# Patient Record
Sex: Female | Born: 1985 | Race: Black or African American | Hispanic: No | Marital: Married | Smoking: Current some day smoker
Health system: Southern US, Community
[De-identification: ages and names within clinical notes are randomized; demographics above are authoritative.]

## PROBLEM LIST (undated history)

## (undated) ENCOUNTER — Inpatient Hospital Stay (HOSPITAL_COMMUNITY): Payer: Self-pay

## (undated) DIAGNOSIS — D649 Anemia, unspecified: Secondary | ICD-10-CM

## (undated) DIAGNOSIS — B009 Herpesviral infection, unspecified: Secondary | ICD-10-CM

## (undated) DIAGNOSIS — B999 Unspecified infectious disease: Secondary | ICD-10-CM

## (undated) DIAGNOSIS — F32A Depression, unspecified: Secondary | ICD-10-CM

## (undated) DIAGNOSIS — N390 Urinary tract infection, site not specified: Secondary | ICD-10-CM

## (undated) DIAGNOSIS — M1990 Unspecified osteoarthritis, unspecified site: Secondary | ICD-10-CM

## (undated) DIAGNOSIS — I10 Essential (primary) hypertension: Secondary | ICD-10-CM

## (undated) DIAGNOSIS — F419 Anxiety disorder, unspecified: Secondary | ICD-10-CM

## (undated) HISTORY — DX: Anemia, unspecified: D64.9

## (undated) HISTORY — DX: Depression, unspecified: F32.A

## (undated) HISTORY — PX: HERNIA REPAIR: SHX51

## (undated) HISTORY — DX: Anxiety disorder, unspecified: F41.9

---

## 2004-07-18 ENCOUNTER — Emergency Department (HOSPITAL_COMMUNITY): Admission: EM | Admit: 2004-07-18 | Discharge: 2004-07-18 | Payer: Self-pay | Admitting: Emergency Medicine

## 2004-11-28 ENCOUNTER — Inpatient Hospital Stay (HOSPITAL_COMMUNITY): Admission: AD | Admit: 2004-11-28 | Discharge: 2004-11-28 | Payer: Self-pay | Admitting: Obstetrics & Gynecology

## 2005-01-04 ENCOUNTER — Emergency Department (HOSPITAL_COMMUNITY): Admission: EM | Admit: 2005-01-04 | Discharge: 2005-01-04 | Payer: Self-pay | Admitting: Emergency Medicine

## 2005-02-25 ENCOUNTER — Ambulatory Visit (HOSPITAL_COMMUNITY): Admission: RE | Admit: 2005-02-25 | Discharge: 2005-02-25 | Payer: Self-pay | Admitting: Obstetrics & Gynecology

## 2005-03-06 ENCOUNTER — Inpatient Hospital Stay (HOSPITAL_COMMUNITY): Admission: AD | Admit: 2005-03-06 | Discharge: 2005-03-06 | Payer: Self-pay | Admitting: Obstetrics

## 2005-07-26 ENCOUNTER — Inpatient Hospital Stay (HOSPITAL_COMMUNITY): Admission: AD | Admit: 2005-07-26 | Discharge: 2005-07-28 | Payer: Self-pay | Admitting: Obstetrics & Gynecology

## 2005-09-17 ENCOUNTER — Ambulatory Visit (HOSPITAL_COMMUNITY): Admission: RE | Admit: 2005-09-17 | Discharge: 2005-09-17 | Payer: Self-pay | Admitting: Obstetrics & Gynecology

## 2007-09-23 LAB — CYTOLOGY - PAP: Pap Smear: NEGATIVE

## 2008-04-05 ENCOUNTER — Inpatient Hospital Stay (HOSPITAL_COMMUNITY): Admission: AD | Admit: 2008-04-05 | Discharge: 2008-04-05 | Payer: Self-pay | Admitting: Obstetrics

## 2008-05-24 ENCOUNTER — Inpatient Hospital Stay (HOSPITAL_COMMUNITY): Admission: AD | Admit: 2008-05-24 | Discharge: 2008-05-24 | Payer: Self-pay | Admitting: Obstetrics

## 2008-06-12 ENCOUNTER — Inpatient Hospital Stay (HOSPITAL_COMMUNITY): Admission: RE | Admit: 2008-06-12 | Discharge: 2008-06-14 | Payer: Self-pay | Admitting: Obstetrics

## 2008-12-29 ENCOUNTER — Emergency Department (HOSPITAL_COMMUNITY): Admission: EM | Admit: 2008-12-29 | Discharge: 2008-12-29 | Payer: Self-pay | Admitting: Emergency Medicine

## 2009-01-28 ENCOUNTER — Emergency Department (HOSPITAL_COMMUNITY): Admission: EM | Admit: 2009-01-28 | Discharge: 2009-01-28 | Payer: Self-pay | Admitting: Emergency Medicine

## 2009-02-11 ENCOUNTER — Emergency Department (HOSPITAL_COMMUNITY): Admission: EM | Admit: 2009-02-11 | Discharge: 2009-02-12 | Payer: Self-pay | Admitting: Emergency Medicine

## 2009-09-01 ENCOUNTER — Inpatient Hospital Stay (HOSPITAL_COMMUNITY): Admission: AD | Admit: 2009-09-01 | Discharge: 2009-09-01 | Payer: Self-pay | Admitting: Obstetrics and Gynecology

## 2009-09-01 ENCOUNTER — Other Ambulatory Visit: Payer: Self-pay | Admitting: Emergency Medicine

## 2009-09-03 ENCOUNTER — Inpatient Hospital Stay (HOSPITAL_COMMUNITY): Admission: AD | Admit: 2009-09-03 | Discharge: 2009-09-03 | Payer: Self-pay | Admitting: Obstetrics & Gynecology

## 2010-12-20 ENCOUNTER — Emergency Department: Payer: Self-pay | Admitting: Emergency Medicine

## 2011-01-22 LAB — CBC
HCT: 30.9 % — ABNORMAL LOW (ref 36.0–46.0)
HCT: 31 % — ABNORMAL LOW (ref 36.0–46.0)
Hemoglobin: 10.2 g/dL — ABNORMAL LOW (ref 12.0–15.0)
Hemoglobin: 10.6 g/dL — ABNORMAL LOW (ref 12.0–15.0)
MCHC: 33.2 g/dL (ref 30.0–36.0)
MCHC: 34.2 g/dL (ref 30.0–36.0)
MCV: 89.4 fL (ref 78.0–100.0)
MCV: 90.4 fL (ref 78.0–100.0)
Platelets: 361 10*3/uL (ref 150–400)
Platelets: 403 10*3/uL — ABNORMAL HIGH (ref 150–400)
RBC: 3.42 MIL/uL — ABNORMAL LOW (ref 3.87–5.11)
RBC: 3.47 MIL/uL — ABNORMAL LOW (ref 3.87–5.11)
RDW: 15.2 % (ref 11.5–15.5)
RDW: 15.4 % (ref 11.5–15.5)
WBC: 7.3 10*3/uL (ref 4.0–10.5)
WBC: 8.6 10*3/uL (ref 4.0–10.5)

## 2011-01-22 LAB — HCG, QUANTITATIVE, PREGNANCY
hCG, Beta Chain, Quant, S: 4181 m[IU]/mL — ABNORMAL HIGH (ref <5)
hCG, Beta Chain, Quant, S: 811 m[IU]/mL — ABNORMAL HIGH (ref <5)

## 2011-01-22 LAB — PREGNANCY, URINE: Preg Test, Ur: POSITIVE

## 2011-01-22 LAB — ABO/RH: ABO/RH(D): A POS

## 2011-01-30 LAB — POCT PREGNANCY, URINE: Preg Test, Ur: POSITIVE

## 2011-03-07 NOTE — H&P (Signed)
NAME:  Long, Kirsten              ACCOUNT NO.:  [id number]   MEDICAL RECORD NO.:  [id number]          PATIENT TYPE:  INP   LOCATION:  9169                          FACILITY:  [facility]   PHYSICIAN:  Roseanna Rainbow, M.D.DATE OF BIRTH:  December 15, 1985   DATE OF ADMISSION:  07/26/2005  DATE OF DISCHARGE:                                HISTORY & PHYSICAL   CHIEF COMPLAINT:  The patient is a 25 year old, gravida 1, para 0, with an  estimated date of confinement of July 26, 2005, with an intrauterine  pregnancy at 40+ weeks, complaining of uterine contractions.   HISTORY OF PRESENT ILLNESS:  Please see the above.   ANTEPARTUM COURSE/PROBLEMS/AND RISKS:  She is rubella equivocal.   PRENATAL SCREEN:  Antibody screen negative.  Blood type A positive.  Chlamydia negative.  GC negative.  GBS negative on June 28, 2005.  Hepatitis B surface antigen negative.  HIV negative.  Pap smear within  normal limits.  Quad screen within normal limits.  Rubella equivocal.  Sickle cell negative.  RPR nonreactive.   PAST MEDICAL HISTORY:  No significant history of medical diseases.   PAST SURGICAL HISTORY:  No previous surgery.   SOCIAL HISTORY:  She is employed at the [employer].  She is single.  Does not give any significant history of alcohol usage, has no significant  smoking history.  She denies elicit drug use.   FAMILY HISTORY:  No major illnesses known.   ALLERGIES:  No known drug allergies.   MEDICATIONS:  Prenatal vitamins.   PHYSICAL EXAMINATION:  VITAL SIGNS:  Stable, afebrile.  Fetal heart tracing  reassuring.  Tocodynamometer with uterine contractions every two minutes.  GENERAL:  Uncomfortable.  ABDOMEN:  Gravid.  PELVIC:  Sterile vaginal exam, 2-to-3-cm dilated, 90% effaced, bulging bag.  Artifical rupture of membranes with clear fluid.   ASSESSMENT:  1.  Primigravida with an intrauterine pregnancy at term.  2.  Latent labor.  3.  Fetal heart tracing consistent with  fetal well being.   PLAN:  1.  Admission.  2.  Expectant management.  3.  Anticipate a spontaneous vaginal delivery.      Roseanna Rainbow, M.D.  Electronically Signed     LAJ/MEDQ  D:  07/26/2005  T:  07/26/2005  Job:  [id number]

## 2011-05-05 ENCOUNTER — Emergency Department: Payer: Self-pay | Admitting: Emergency Medicine

## 2011-05-07 ENCOUNTER — Ambulatory Visit: Payer: Self-pay | Admitting: Family Medicine

## 2011-06-20 ENCOUNTER — Encounter: Payer: Self-pay | Admitting: Internal Medicine

## 2011-08-18 ENCOUNTER — Emergency Department (HOSPITAL_BASED_OUTPATIENT_CLINIC_OR_DEPARTMENT_OTHER)
Admission: EM | Admit: 2011-08-18 | Discharge: 2011-08-18 | Disposition: A | Discharge status: Home / Self Care | Payer: Managed Care, Other (non HMO) | Attending: Emergency Medicine | Admitting: Emergency Medicine

## 2011-08-18 ENCOUNTER — Encounter: Payer: Self-pay | Admitting: *Deleted

## 2011-08-18 ENCOUNTER — Emergency Department (INDEPENDENT_AMBULATORY_CARE_PROVIDER_SITE_OTHER): Payer: Managed Care, Other (non HMO)

## 2011-08-18 DIAGNOSIS — M549 Dorsalgia, unspecified: Secondary | ICD-10-CM | POA: Insufficient documentation

## 2011-08-18 DIAGNOSIS — R079 Chest pain, unspecified: Secondary | ICD-10-CM

## 2011-08-18 DIAGNOSIS — R0789 Other chest pain: Secondary | ICD-10-CM

## 2011-08-18 DIAGNOSIS — R071 Chest pain on breathing: Secondary | ICD-10-CM | POA: Insufficient documentation

## 2011-08-18 LAB — BASIC METABOLIC PANEL
BUN: 13 mg/dL (ref 6–23)
CO2: 29 mEq/L (ref 19–32)
Calcium: 9.7 mg/dL (ref 8.4–10.5)
Chloride: 104 mEq/L (ref 96–112)
Creatinine, Ser: 0.6 mg/dL (ref 0.50–1.10)
GFR calc Af Amer: >90 mL/min (ref >90)
GFR calc non Af Amer: >90 mL/min (ref >90)
Glucose, Bld: 93 mg/dL (ref 70–99)
Potassium: 3.7 mEq/L (ref 3.5–5.1)
Sodium: 139 mEq/L (ref 135–145)

## 2011-08-18 LAB — D-DIMER, QUANTITATIVE: D-Dimer, Quant: <0.22 ug/mL-FEU (ref 0.00–0.48)

## 2011-08-18 LAB — CBC
HCT: 35.8 % — ABNORMAL LOW (ref 36.0–46.0)
Hemoglobin: 12.3 g/dL (ref 12.0–15.0)
MCH: 29.8 pg (ref 26.0–34.0)
MCV: 86.7 fL (ref 78.0–100.0)
Platelets: 417 10*3/uL — ABNORMAL HIGH (ref 150–400)
RBC: 4.13 MIL/uL (ref 3.87–5.11)
RDW: 12.8 % (ref 11.5–15.5)
WBC: 7.4 10*3/uL (ref 4.0–10.5)

## 2011-08-18 MED ORDER — CYCLOBENZAPRINE HCL 10 MG PO TABS: 10.0000 mg | ORAL_TABLET | Freq: Two times a day (BID) | ORAL | Status: AC | PRN

## 2011-08-18 MED ORDER — HYDROCODONE-ACETAMINOPHEN 5-325 MG PO TABS: 1.0000 | ORAL_TABLET | Freq: Four times a day (QID) | ORAL | Status: AC | PRN

## 2011-08-18 MED ORDER — NAPROXEN 500 MG PO TABS: 500.0000 mg | ORAL_TABLET | Freq: Two times a day (BID) | ORAL | Status: DC

## 2011-08-18 NOTE — ED Notes (Signed)
Patient states she has had bilateral lower back pain for over one month.  Yesterday the pain worsened and is radiating up the right side of her body and across her anterior chest.

## 2011-08-18 NOTE — ED Provider Notes (Signed)
History    Scribed for Kirsten Jakes, MD, the patient was seen in room MH02/MH02. This chart was scribed by Katha Cabal.   CSN: [id number] Arrival date & time: 08/18/2011  3:51 PM   First MD Initiated Contact with Patient 08/18/11 1557      Chief Complaint  Patient presents with  . Back Pain    right side back pain radiating in to right chest and across the anterior chest    (Consider location/radiation/quality/duration/timing/severity/associated sxs/prior treatment) HPI Kirsten Long is a 25 y.o. female who presents to the Emergency Department complaining of sudden onset of ongoing moderate lower back pain.  Patient reports intermittent back pain for a couple years.  Persistent lower back pain that is greater in right than left back.  Current episode began a couple days ago.  Pain is described as throbbing.  Rated 9/10.  There is no radiation to LE and no numbness in feet.  Patient reports new onset of persistent right sided chest pain yesterday that radiates to right side.  Chest pain worsens with coughing and deep breathing.  Pain does not worsen with movement of right arm.  Denies abdominal pain, nausea, vomiting, swelling in legs, rash, fever and headache. There is no history of serious medical problems.    History reviewed. No pertinent past medical history.  History reviewed. No pertinent past surgical history.  No family history on file.  History  Substance Use Topics  . Smoking status: Passive Smoker  . Smokeless tobacco: Not on file  . Alcohol Use: Yes     occassionally    OB History    Grav Para Term Preterm Abortions TAB SAB Ect Mult Living                  Review of Systems 10 Systems reviewed and are negative for acute change except as noted in the HPI.  Allergies  Review of patient's allergies indicates no known allergies.  Home Medications   Current Outpatient Rx  Name Route Sig Dispense Refill  . IBUPROFEN 200 MG PO TABS Oral Take 400 mg  by mouth every 6 (six) hours as needed. For pain       BP 128/95  Pulse 87  Temp(Src) 98.9 F (37.2 C) (Oral)  Resp 20  Ht 5\' 3"  (1.6 m)  Wt 140 lb (63.504 kg)  BMI 24.80 kg/m2  SpO2 100%  LMP 08/09/2011  Physical Exam  Nursing note and vitals reviewed. Constitutional: She is oriented to person, place, and time. She appears well-developed and well-nourished. No distress.  HENT:  Head: Normocephalic and atraumatic.  Eyes: EOM are normal. Pupils are equal, round, and reactive to light.  Neck: Neck supple.  Cardiovascular: Normal rate, regular rhythm and normal heart sounds.   No murmur heard. Pulmonary/Chest: Effort normal and breath sounds normal. No respiratory distress. She exhibits no tenderness.  Abdominal: Soft. Bowel sounds are normal. There is no tenderness. There is no rebound and no guarding.  Musculoskeletal: Normal range of motion. She exhibits no edema.  Neurological: She is alert and oriented to person, place, and time. She has normal strength. No cranial nerve deficit or sensory deficit. Coordination normal.  Skin: Skin is warm and dry. No rash noted.  Psychiatric: Her behavior is normal.    ED Course  Procedures (including critical care time)   DIAGNOSTIC STUDIES: Oxygen Saturation is 100% on room air, normal by my interpretation.    EKG:    Date: 08/18/2011  Rate:  62 Rhythm: normal sinus rhythm and sinus arrhythmia  QRS Axis: normal  Intervals: normal  ST/T Wave abnormalities: normal  Conduction Disutrbances:none  Narrative Interpretation:   Old EKG Reviewed: none available   COORDINATION OF CARE:   4:26 PM  Reviewed EKG results with patient.  Discussed plan of treatment with patient.   4:30 PM  Physical exam complete.  Will order labs and CXR.   6:44 PM  Reviewed labs and CXR.  No significant abnormalities. Discussed findings with patient and plan to discharge patient.      LABS / RADIOLOGY:   Labs Reviewed  CBC - Abnormal; Notable for the  following:    HCT 35.8 (*)    Platelets 417 (*)    All other components within normal limits  BASIC METABOLIC PANEL  TROPONIN I  D-DIMER, QUANTITATIVE   Results for orders placed during the hospital encounter of 08/18/11 (from the past 24 hour(s))  BASIC METABOLIC PANEL     Status: Normal   Collection Time   08/18/11  4:49 PM      Component Value Range   Sodium 139  135 - 145 (mEq/L)   Potassium 3.7  3.5 - 5.1 (mEq/L)   Chloride 104  96 - 112 (mEq/L)   CO2 29  19 - 32 (mEq/L)   Glucose, Bld 93  70 - 99 (mg/dL)   BUN 13  6 - 23 (mg/dL)   Creatinine, Ser 1.61  0.50 - 1.10 (mg/dL)   Calcium 9.7  8.4 - 09.6 (mg/dL)   GFR calc non Af Amer >90  >90 (mL/min)   GFR calc Af Amer >90  >90 (mL/min)  CBC     Status: Abnormal   Collection Time   08/18/11  4:49 PM      Component Value Range   WBC 7.4  4.0 - 10.5 (K/uL)   RBC 4.13  3.87 - 5.11 (MIL/uL)   Hemoglobin 12.3  12.0 - 15.0 (g/dL)   HCT 04.5 (*) 40.9 - 46.0 (%)   MCV 86.7  78.0 - 100.0 (fL)   MCH 29.8  26.0 - 34.0 (pg)   MCHC 34.4  30.0 - 36.0 (g/dL)   RDW 81.1  91.4 - 78.2 (%)   Platelets 417 (*) 150 - 400 (K/uL)  TROPONIN I     Status: Normal   Collection Time   08/18/11  4:49 PM      Component Value Range   Troponin I <0.30  <0.30 (ng/mL)  D-DIMER, QUANTITATIVE     Status: Normal   Collection Time   08/18/11  4:49 PM      Component Value Range   D-Dimer, Quant <0.22  0.00 - 0.48 (ug/mL-FEU)     Dg Chest 2 View  08/18/2011  *RADIOLOGY REPORT*  Clinical Data: Chest pain  CHEST - 2 VIEW  Comparison: None  Findings: Normal heart size, mediastinal contours, and pulmonary vascularity. Lungs clear. Bones unremarkable. No pneumothorax.  IMPRESSION: Normal exam.  Original Report Authenticated By: Lollie Marrow, M.D.        MDM   MDM: Patient with 2 complaints. The back pain is in the lumbar area consistent with a lumbar strain or low musculoskeletal back pain. No focal deficits. No evidence of sciatica. The chest  pain is most likely musculoskeletal. D-dimer negative for pulmonary embolism chest x-ray negative for pneumothorax or pneumonia. Troponi,n EKG not consistent with acute cardiac event.        IMPRESSION: Lumbar back pain. Chest wall pain  I personally performed the services described in this documentation, which was scribed in my presence. The recorded information has been reviewed and considered.             Kirsten Jakes, MD 08/18/11 [phone]

## 2011-08-18 NOTE — ED Notes (Signed)
Pt in with c/o lower back pain x 1 month with radiation of pain to right chest wall. Pt reports onset of substernal CP last night and reports increased coughing that is described as non productive and dry. Denies N V D LOC with onset of chest pain and reports increased SOB with coughing. Reports lower back pain is chronic and reports having a "cracked tailbone" and reports occasional tingling in right foot. Airway patent and intact.

## 2011-10-21 NOTE — L&D Delivery Note (Signed)
Delivery Note At 1:30 PM a viable female was delivered via Vaginal, Spontaneous Delivery (Presentation: ; Occiput Anterior).  APGAR: 9, 9; weight 8 lb 9.6 oz (3900 g).   Placenta status: Intact, Spontaneous.  Cord: 3 vessels with the following complications: None.  Cord pH: n/a  Anesthesia: Epidural  Episiotomy: None Lacerations: None Suture Repair: none Est. Blood Loss (mL): 300  Mom to postpartum.  Baby to skin-to-skin.  [name] 09/23/2012, 12:46 PM

## 2011-11-23 ENCOUNTER — Other Ambulatory Visit: Payer: Self-pay

## 2011-11-23 ENCOUNTER — Emergency Department (HOSPITAL_COMMUNITY)
Admission: EM | Admit: 2011-11-23 | Discharge: 2011-11-24 | Disposition: A | Discharge status: Home / Self Care | Payer: Managed Care, Other (non HMO) | Attending: Emergency Medicine | Admitting: Emergency Medicine

## 2011-11-23 DIAGNOSIS — R079 Chest pain, unspecified: Secondary | ICD-10-CM | POA: Insufficient documentation

## 2011-11-23 DIAGNOSIS — N644 Mastodynia: Secondary | ICD-10-CM | POA: Insufficient documentation

## 2011-11-23 DIAGNOSIS — R0789 Other chest pain: Secondary | ICD-10-CM

## 2011-11-24 ENCOUNTER — Emergency Department (HOSPITAL_COMMUNITY): Payer: Managed Care, Other (non HMO)

## 2011-11-24 ENCOUNTER — Encounter (HOSPITAL_COMMUNITY): Payer: Self-pay | Admitting: *Deleted

## 2011-11-24 MED ORDER — IBUPROFEN 600 MG PO TABS: 600.0000 mg | ORAL_TABLET | Freq: Four times a day (QID) | ORAL | Status: AC | PRN

## 2011-11-24 MED ORDER — HYDROCODONE-ACETAMINOPHEN 5-500 MG PO TABS: 1.0000 | ORAL_TABLET | Freq: Four times a day (QID) | ORAL | Status: AC | PRN

## 2011-11-24 MED ORDER — HYDROCODONE-ACETAMINOPHEN 5-325 MG PO TABS
1.0000 | ORAL_TABLET | Freq: Once | ORAL | Status: AC
  Administered 2011-11-24: 1 via ORAL
  Filled 2011-11-24: qty 1

## 2011-11-24 NOTE — ED Provider Notes (Signed)
Medical screening examination/treatment/procedure(s) were performed by non-physician practitioner and as supervising physician I was immediately available for consultation/collaboration.  Doug Sou, MD 11/24/11 [phone]

## 2011-11-24 NOTE — ED Provider Notes (Signed)
History     CSN: [id number]  Arrival date & time 11/23/11  2355   First MD Initiated Contact with Patient 11/24/11 0011      Chief Complaint  Patient presents with  . Breast Pain    (Consider location/radiation/quality/duration/timing/severity/associated sxs/prior treatment) Patient is a 26 y.o. female presenting with chest pain. The history is provided by the patient.  Chest Pain The chest pain began yesterday. Chest pain occurs constantly. The chest pain is worsening. The pain is associated with breathing and lifting. At its most intense, the pain is at 8/10. The quality of the pain is described as sharp and aching. The pain does not radiate. Exacerbated by: palpation, movement of left arm. Pertinent negatives for primary symptoms include no fever, no shortness of breath, no cough, no wheezing, no palpitations, no abdominal pain, no nausea and no vomiting. She tried nothing for the symptoms.   Pt states she has had similar pain in the past, but never this severe. Pt denies lifting anything heavy or getting hit in the chest. States she has a young child that she carries around a lot. No other complaints.  History reviewed. No pertinent past medical history.  History reviewed. No pertinent past surgical history.  No family history on file.  History  Substance Use Topics  . Smoking status: Passive Smoker  . Smokeless tobacco: Not on file  . Alcohol Use: Yes     occassionally    OB History    Grav Para Term Preterm Abortions TAB SAB Ect Mult Living                  Review of Systems  Constitutional: Negative for fever and chills.  HENT: Negative.   Eyes: Negative.   Respiratory: Negative for cough, shortness of breath and wheezing.   Cardiovascular: Positive for chest pain. Negative for palpitations and leg swelling.  Gastrointestinal: Negative.  Negative for nausea, vomiting and abdominal pain.  Genitourinary: Negative.   Musculoskeletal: Negative.   Skin: Negative.     Neurological: Negative.   Psychiatric/Behavioral: Negative.     Allergies  Review of patient's allergies indicates no known allergies.  Home Medications  No current outpatient prescriptions on file.  BP 129/90  Pulse 75  Temp(Src) 98.5 F (36.9 C) (Oral)  SpO2 100%  LMP 11/21/2011  Physical Exam  Nursing note and vitals reviewed. Constitutional: She is oriented to person, place, and time. She appears well-developed and well-nourished. No distress.  HENT:  Head: Normocephalic and atraumatic.  Eyes: Conjunctivae are normal.  Neck: Neck supple.  Cardiovascular: Normal rate, regular rhythm and normal heart sounds.   Pulmonary/Chest: Effort normal and breath sounds normal.       Left breat normal appearing, no signs of trauma, no contusion, no rash, no abscess. Tenderness to palpation over parasternal area and  muscles on the left side.   Abdominal: Soft. Bowel sounds are normal. There is no tenderness.  Musculoskeletal:       Pain with left arm rom, pain with arm adduction, flexion at shoulder joint.  Neurological: She is alert and oriented to person, place, and time.  Skin: Skin is warm and dry. No erythema.  Psychiatric: She has a normal mood and affect.    ED Course  Procedures (including critical care time)   Pt with reproducible left sided chest pain, worsened with palpation and left arm movement. No injuries. No breast abnormalities on exam. Pt denies cough, SOB. PERC negative. Will get CXR, Urine pregnancy  Dg  Chest 2 View  11/24/2011  *RADIOLOGY REPORT*  Clinical Data: Chest pain  CHEST - 2 VIEW  Comparison: 08/18/2011  Findings: Lungs clear.  Heart size and pulmonary vascularity normal.  No effusion.  Visualized bones unremarkable.  IMPRESSION: No acute disease  Original Report Authenticated By: Osa Craver, M.D.    Date: 11/24/2011  Rate: 62  Rhythm: normal sinus rhythm  QRS Axis: normal  Intervals: normal  ST/T Wave abnormalities: normal  Conduction  Disutrbances: none  Narrative Interpretation:   Old EKG Reviewed: No significant changes noted   1:22 AM ECG normal. CXR normal. Not pregnant. VS normal. Pain reproducible. Will d/c home with pain meds and follow up.    No diagnosis found.    MDM          Lottie Mussel, PA 11/24/11 [phone]

## 2011-11-24 NOTE — ED Notes (Signed)
Lt breast pain since yesterday.   No known injury.  She has had in the past

## 2011-11-24 NOTE — ED Notes (Signed)
Pt c/o left breast pain. Tender to palpation. Denies fall, or activity associated to injury. Patients pain exacerbated by arm movement.

## 2012-01-08 ENCOUNTER — Other Ambulatory Visit (HOSPITAL_COMMUNITY)
Admission: RE | Admit: 2012-01-08 | Discharge: 2012-01-08 | Disposition: A | Discharge status: Home / Self Care | Payer: Managed Care, Other (non HMO) | Source: Ambulatory Visit | Attending: Family Medicine | Admitting: Family Medicine

## 2012-01-08 DIAGNOSIS — Z01419 Encounter for gynecological examination (general) (routine) without abnormal findings: Secondary | ICD-10-CM | POA: Insufficient documentation

## 2012-02-05 LAB — OB RESULTS CONSOLE ANTIBODY SCREEN: Antibody Screen: NEGATIVE

## 2012-02-05 LAB — OB RESULTS CONSOLE HEPATITIS B SURFACE ANTIGEN: Hepatitis B Surface Ag: NEGATIVE

## 2012-04-05 ENCOUNTER — Inpatient Hospital Stay (HOSPITAL_COMMUNITY)
Admission: AD | Admit: 2012-04-05 | Discharge: 2012-04-05 | Disposition: A | Discharge status: Home / Self Care | Payer: Managed Care, Other (non HMO) | Source: Ambulatory Visit | Attending: Obstetrics & Gynecology | Admitting: Obstetrics & Gynecology

## 2012-04-05 ENCOUNTER — Encounter (HOSPITAL_COMMUNITY): Payer: Self-pay

## 2012-04-05 DIAGNOSIS — O219 Vomiting of pregnancy, unspecified: Secondary | ICD-10-CM

## 2012-04-05 DIAGNOSIS — R1114 Bilious vomiting: Secondary | ICD-10-CM

## 2012-04-05 DIAGNOSIS — O210 Mild hyperemesis gravidarum: Secondary | ICD-10-CM | POA: Insufficient documentation

## 2012-04-05 LAB — URINALYSIS, ROUTINE W REFLEX MICROSCOPIC
Glucose, UA: NEGATIVE mg/dL
Protein, ur: NEGATIVE mg/dL

## 2012-04-05 LAB — CBC
HCT: 34.1 % — ABNORMAL LOW (ref 36.0–46.0)
Hemoglobin: 11.8 g/dL — ABNORMAL LOW (ref 12.0–15.0)
MCH: 30.1 pg (ref 26.0–34.0)
MCHC: 34.6 g/dL (ref 30.0–36.0)

## 2012-04-05 LAB — URINE MICROSCOPIC-ADD ON

## 2012-04-05 MED ORDER — ONDANSETRON HCL 4 MG PO TABS: 4.0000 mg | ORAL_TABLET | Freq: Three times a day (TID) | ORAL | Status: AC | PRN

## 2012-04-05 MED ORDER — ONDANSETRON 8 MG PO TBDP
8.0000 mg | ORAL_TABLET | ORAL | Status: AC
  Administered 2012-04-05: 8 mg via ORAL
  Filled 2012-04-05: qty 1

## 2012-04-05 NOTE — MAU Note (Signed)
Patient states she has had nausea and vomiting that has been getting worse for the past 2 weeks, now has some blood in it. Patient states that the last weekend in May she fell on her buttocks down about 12 stairs and continues to have bad lower back pain. Hard to sit. Denies any bleeding or discharge.

## 2012-04-05 NOTE — Discharge Instructions (Signed)
Morning Sickness Morning sickness is when you feel sick to your stomach (nauseous) during pregnancy. You may feel sick to your stomach and throw up (vomit). You may feel sick in the morning, but you can feel this way any time of day. Some women feel very sick to their stomach and cannot stop throwing up (hyperemesis gravidarum). HOME CARE  Take multivitamins as told by your doctor. Taking multivitamins before getting pregnant can stop or lessen the harshness of morning sickness.   Eat dry toast or unsalted crackers before getting out of bed.   Eat 5 to 6 small meals a day.   Eat dry and bland foods like rice and baked potatoes.   Do not drink liquids with meals. Drink between meals.   Do not eat greasy, fatty, or spicy foods.   Have someone cook for you if the smell of food causes you to feel sick or throw up.   Do not take vitamins with iron, or as told by your doctor.   Eat protein when you need a snack (nuts, yogurt, cheese).   Eat unsweetened gelatins for dessert.   Wear a bracelet used for sea sickness (acupressure wristband).   Go to a doctor that puts thin needles into certain body points (acupuncture) to improve how you feel.   Do not smoke.   Use a humidifier to keep the air in your house free of odors.  GET HELP RIGHT AWAY IF:   You feel very sick to your stomach and cannot stop throwing up.   You pass out (faint).   You have a fever.   You need medicine to feel better.   You feel dizzy or lightheaded.   You are losing weight.   You need help knowing what to eat and what not to eat.  MAKE SURE YOU:   Understand these instructions.   Will watch your condition.   Will get help right away if you are not doing well or get worse.  Document Released: 11/13/2004 Document Revised: 09/25/2011 Document Reviewed: 01/03/2010 [employer] Patient Information 2012 [employer]. 

## 2012-04-05 NOTE — MAU Provider Note (Signed)
  History     CSN: [id number]  Arrival date and time: 04/05/12 1738   First Provider Initiated Contact with Patient 04/05/12 2019      Chief Complaint  Patient presents with  . Emesis   HPI  Has been vomiting for the last two weeks.  Seems like everything she eats and drinks.  States that there is now "blood" in her vomit.  Like "strings of blood".  No bile in vomit.  No lightheadedness.  Urine is quite dark.  Decreased urine output.  OB History    Grav Para Term Preterm Abortions TAB SAB Ect Mult Living   4 2 2  1 1    2       History reviewed. No pertinent past medical history.  History reviewed. No pertinent past surgical history.  History reviewed. No pertinent family history.  History  Substance Use Topics  . Smoking status: Former Games developer  . Smokeless tobacco: Not on file  . Alcohol Use: No     occassionally    Allergies: No Known Allergies  Prescriptions prior to admission  Medication Sig Dispense Refill  . acetaminophen (TYLENOL) 500 MG tablet Take 1,000 mg by mouth every 6 (six) hours as needed.      . Prenatal Vit-Fe Fumarate-FA (PRENATAL MULTIVITAMIN) TABS Take 1 tablet by mouth every morning.        Review of Systems  Constitutional: Negative for fever and chills.  HENT: Negative for congestion and sore throat.   Musculoskeletal: Positive for back pain (Complains of lower back pain from over Memorial day weekend, when she fell down 12 stairs.  No incontinence of stool or urine.  No saddle anesthesia.  No weakness or focal neurologic defecit.) and falls (see above.).  Skin: Negative for itching and rash.  Neurological: Positive for headaches (Slight headache). Negative for speech change, focal weakness, seizures and loss of consciousness.  Psychiatric/Behavioral: Negative for depression. The patient is not nervous/anxious.    Physical Exam   Blood pressure 114/72, pulse 83, temperature 99.3 F (37.4 C), resp. rate 16, height 5\' 3"  (1.6 m), weight  67.495 kg (148 lb 12.8 oz), last menstrual period 12/03/2011, SpO2 100.00%.  Physical Exam  Constitutional: She is oriented to person, place, and time. She appears well-developed and well-nourished. No distress.  HENT:  Head: Normocephalic and atraumatic.  Eyes: Conjunctivae and EOM are normal. Right eye exhibits no discharge. Left eye exhibits no discharge. No scleral icterus.  Neck: Neck supple. No tracheal deviation present.  Cardiovascular: Normal rate, regular rhythm and normal heart sounds.   Respiratory: Effort normal and breath sounds normal. No stridor. No respiratory distress.  GI: Soft. She exhibits no distension and no mass. There is no tenderness. There is no rebound and no guarding.  Neurological: She is alert and oriented to person, place, and time.  Skin: Skin is warm and dry. She is not diaphoretic.  Psychiatric: She has a normal mood and affect. Her behavior is normal. Judgment and thought content normal.    MAU Course  Procedures   Assessment and Plan  Nausea relieved with zofran here.  Tolerating oral intake now. Will rx zofran and discharge with instructions to return if amount of blood increases.  Clancy Gourd 04/05/2012, 8:22 PM

## 2012-04-06 NOTE — MAU Provider Note (Signed)
I was present for the exam and agree with above.  [place], CNM 04/06/2012 12:20 AM

## 2012-07-06 ENCOUNTER — Encounter (HOSPITAL_COMMUNITY): Payer: Self-pay | Admitting: Family

## 2012-07-06 ENCOUNTER — Inpatient Hospital Stay (HOSPITAL_COMMUNITY)
Admission: AD | Admit: 2012-07-06 | Discharge: 2012-07-06 | Disposition: A | Discharge status: Home / Self Care | Payer: Managed Care, Other (non HMO) | Source: Ambulatory Visit | Attending: Obstetrics and Gynecology | Admitting: Obstetrics and Gynecology

## 2012-07-06 DIAGNOSIS — N898 Other specified noninflammatory disorders of vagina: Secondary | ICD-10-CM

## 2012-07-06 DIAGNOSIS — O99891 Other specified diseases and conditions complicating pregnancy: Secondary | ICD-10-CM | POA: Insufficient documentation

## 2012-07-06 DIAGNOSIS — N949 Unspecified condition associated with female genital organs and menstrual cycle: Secondary | ICD-10-CM | POA: Insufficient documentation

## 2012-07-06 DIAGNOSIS — O26899 Other specified pregnancy related conditions, unspecified trimester: Secondary | ICD-10-CM

## 2012-07-06 DIAGNOSIS — R109 Unspecified abdominal pain: Secondary | ICD-10-CM | POA: Insufficient documentation

## 2012-07-06 HISTORY — DX: Unspecified infectious disease: B99.9

## 2012-07-06 LAB — URINALYSIS, ROUTINE W REFLEX MICROSCOPIC
Glucose, UA: NEGATIVE mg/dL
Hgb urine dipstick: NEGATIVE
Ketones, ur: 15 mg/dL — AB
Protein, ur: NEGATIVE mg/dL

## 2012-07-06 NOTE — MAU Note (Signed)
Pt states has had vaginal pressure for a couple of weeks and started having clear discharge on Friday. Continues through today, enough to have to wear a pantytliner.

## 2012-07-06 NOTE — MAU Note (Signed)
Patient states she has had lower abdominal pain and pressure for about 2 weeks off and on. States she started having a vaginal discharge on 9-13 that continues and she has to wear a pad. Clear fluid with no odor or blood. Reports good fetal movement.

## 2012-07-06 NOTE — MAU Provider Note (Signed)
Chief Complaint:  Abdominal Pain and Vaginal Discharge  Initial contact at 1730     HPI: Kirsten Long is a 26 y.o. [id number] at [redacted]w[redacted]d who presents to maternity admissions reporting lower abdominal pressure relieved when lying down and clear vaginal discharge. She has had wetness in her underwear enough to wear a mini pad over the last 5 days. No gush of fluid. No irritation or itching. No dysuria, frequency or urgency of urination.  Denies contractions or vaginal bleeding. Good fetal movement.   Pregnancy Course: uncomplicated  Past Medical History: Past Medical History  Diagnosis Date  . No pertinent past medical history   . Infection     Valtrex for HSV 2009     Past obstetric history: OB History    Grav Para Term Preterm Abortions TAB SAB Ect Mult Living   4 2 2  1 1    2      # Outc Date GA Lbr Len/2nd Wgt Sex Del Anes PTL Lv   1 TRM            2 TRM            3 TAB            4 CUR               Past Surgical History: No past surgical history on file.  Family History: No family history on file.  Social History: History  Substance Use Topics  . Smoking status: Former Games developer  . Smokeless tobacco: Not on file  . Alcohol Use: No     occassionally    Allergies: No Known Allergies  Meds:  Prescriptions prior to admission  Medication Sig Dispense Refill  . acetaminophen (TYLENOL) 500 MG tablet Take 1,000 mg by mouth every 6 (six) hours as needed. headache      . Prenatal Vit-Fe Fumarate-FA (PRENATAL MULTIVITAMIN) TABS Take 1 tablet by mouth every morning.        ROS: Pertinent findings in history of present illness.  Physical Exam  Blood pressure 123/76, pulse 74, temperature 99 F (37.2 C), temperature source Oral, resp. rate 16, height 5' 4.5" (1.638 m), weight 70.217 kg (154 lb 12.8 oz), last menstrual period 12/03/2011, SpO2 100.00%. GENERAL: Well-developed, well-nourished female in no acute distress.  HEENT: normocephalic HEART: normal rate RESP:  normal effort ABDOMEN: Soft, non-tender, gravid appropriate for gestational age EXTREMITIES: Nontender, no edema NEURO: alert and oriented SPECULUM EXAM: NEFG, physiologic white discharge, no blood, cervix clean  Dilation: Fingertip Effacement (%): Thick Cervical Position: Posterior Station: Ballotable Presentation: Undeterminable Exam by:: Dierdre Alvina Strother, CNM   FHT:  Baseline 125-130, reactive , moderate variability Contractions: none   Labs: Results for orders placed during the hospital encounter of 07/06/12 (from the past 24 hour(s))  URINALYSIS, ROUTINE W REFLEX MICROSCOPIC     Status: Abnormal   Collection Time   07/06/12  5:00 PM      Component Value Range   Color, Urine YELLOW  YELLOW   APPearance CLEAR  CLEAR   Specific Gravity, Urine 1.020  1.005 - 1.030   pH 7.0  5.0 - 8.0   Glucose, UA NEGATIVE  NEGATIVE mg/dL   Hgb urine dipstick NEGATIVE  NEGATIVE   Bilirubin Urine NEGATIVE  NEGATIVE   Ketones, ur 15 (*) NEGATIVE mg/dL   Protein, ur NEGATIVE  NEGATIVE mg/dL   Urobilinogen, UA 2.0 (*) 0.0 - 1.0 mg/dL   Nitrite NEGATIVE  NEGATIVE   Leukocytes, UA NEGATIVE  NEGATIVE  AMNISURE RUPTURE OF MEMBRANE (ROM)     Status: Normal   Collection Time   07/06/12  6:16 PM      Component Value Range   Amnisure ROM NEGATIVE    POCT FERN TEST     Status: Normal   Collection Time   07/06/12  6:30 PM      Component Value Range   Fern Test Negative      Assessment: 1. Abdominal pain complicating pregnancy   2. Clear vaginal discharge   26 yo G4P2012 at [redacted]w[redacted]d without evident SROM  Plan: Consulted Dr. Richardson Dopp Discharge home Labor precautions, RLP relief measures and fetal kick counts    Medication List     As of 07/06/2012  5:34 PM    ASK your doctor about these medications         acetaminophen 500 MG tablet   Commonly known as: TYLENOL   Take 1,000 mg by mouth every 6 (six) hours as needed. headache      prenatal multivitamin Tabs   Take 1 tablet by mouth every morning.          Danae Orleans, CNM 07/06/2012 5:34 PM

## 2012-09-12 ENCOUNTER — Encounter (HOSPITAL_COMMUNITY): Payer: Self-pay | Admitting: Obstetrics and Gynecology

## 2012-09-12 ENCOUNTER — Inpatient Hospital Stay (HOSPITAL_COMMUNITY)
Admission: AD | Admit: 2012-09-12 | Discharge: 2012-09-12 | Disposition: A | Discharge status: Home / Self Care | Payer: Managed Care, Other (non HMO) | Source: Ambulatory Visit | Attending: Obstetrics & Gynecology | Admitting: Obstetrics & Gynecology

## 2012-09-12 DIAGNOSIS — O479 False labor, unspecified: Secondary | ICD-10-CM | POA: Insufficient documentation

## 2012-09-12 HISTORY — DX: Herpesviral infection, unspecified: B00.9

## 2012-09-12 NOTE — Progress Notes (Signed)
Dr. Christell Constant notified of cervical exam. 3, 80, -3. Variable on fetal tracing otherwise reactive. Plans to ambulate patient and recheck in one hour.

## 2012-09-12 NOTE — Progress Notes (Signed)
Dr. Christell Constant notified of unchanged cervical exam. Pt ok to go home and instructed to come back immediately if contractions become worse.

## 2012-09-12 NOTE — MAU Note (Signed)
Pt presents to MAU with chief complaint of contractions. Pt says the contractions started this morning around 1:00 am- pt is [redacted]w[redacted]d, G4P2. No leakage of fluid/ bleeding

## 2012-09-15 ENCOUNTER — Other Ambulatory Visit: Payer: Self-pay | Admitting: Obstetrics and Gynecology

## 2012-09-21 ENCOUNTER — Other Ambulatory Visit: Payer: Self-pay | Admitting: Obstetrics and Gynecology

## 2012-09-22 ENCOUNTER — Inpatient Hospital Stay (HOSPITAL_COMMUNITY): Payer: Managed Care, Other (non HMO) | Admitting: Anesthesiology

## 2012-09-22 ENCOUNTER — Encounter (HOSPITAL_COMMUNITY): Payer: Self-pay | Admitting: Anesthesiology

## 2012-09-22 ENCOUNTER — Encounter (HOSPITAL_COMMUNITY): Payer: Self-pay

## 2012-09-22 ENCOUNTER — Encounter (HOSPITAL_COMMUNITY)
Admission: RE | Disposition: A | Discharge status: Home / Self Care | Payer: Self-pay | Source: Ambulatory Visit | Attending: Obstetrics and Gynecology

## 2012-09-22 ENCOUNTER — Inpatient Hospital Stay (HOSPITAL_COMMUNITY)
Admission: RE | Admit: 2012-09-22 | Discharge: 2012-09-24 | DRG: 775 | Disposition: A | Discharge status: Home / Self Care | Payer: Managed Care, Other (non HMO) | Source: Ambulatory Visit | Attending: Obstetrics and Gynecology | Admitting: Obstetrics and Gynecology

## 2012-09-22 LAB — CBC
HCT: 32.1 % — ABNORMAL LOW (ref 36.0–46.0)
MCV: 90.4 fL (ref 78.0–100.0)
RDW: 14 % (ref 11.5–15.5)
WBC: 7.5 10*3/uL (ref 4.0–10.5)

## 2012-09-22 SURGERY — LIGATION, FALLOPIAN TUBE, POSTPARTUM
Anesthesia: Epidural | Laterality: Bilateral

## 2012-09-22 MED ORDER — WITCH HAZEL-GLYCERIN EX PADS: 1.0000 "application " | MEDICATED_PAD | CUTANEOUS | Status: DC | PRN

## 2012-09-22 MED ORDER — IBUPROFEN 600 MG PO TABS: 600.0000 mg | ORAL_TABLET | Freq: Four times a day (QID) | ORAL | Status: DC | PRN

## 2012-09-22 MED ORDER — CEFAZOLIN SODIUM-DEXTROSE 2-3 GM-% IV SOLR: 2.0000 g | INTRAVENOUS | Status: DC

## 2012-09-22 MED ORDER — TERBUTALINE SULFATE 1 MG/ML IJ SOLN: 0.2500 mg | Freq: Once | INTRAMUSCULAR | Status: DC | PRN

## 2012-09-22 MED ORDER — DIPHENHYDRAMINE HCL 50 MG/ML IJ SOLN: 12.5000 mg | INTRAMUSCULAR | Status: DC | PRN

## 2012-09-22 MED ORDER — ONDANSETRON HCL 4 MG PO TABS: 4.0000 mg | ORAL_TABLET | ORAL | Status: DC | PRN

## 2012-09-22 MED ORDER — PHENYLEPHRINE 40 MCG/ML (10ML) SYRINGE FOR IV PUSH (FOR BLOOD PRESSURE SUPPORT): 80.0000 ug | PREFILLED_SYRINGE | INTRAVENOUS | Status: DC | PRN

## 2012-09-22 MED ORDER — LIDOCAINE HCL (PF) 1 % IJ SOLN: 30.0000 mL | INTRAMUSCULAR | Status: DC | PRN

## 2012-09-22 MED ORDER — OXYCODONE-ACETAMINOPHEN 5-325 MG PO TABS: 1.0000 | ORAL_TABLET | ORAL | Status: DC | PRN

## 2012-09-22 MED ORDER — CITRIC ACID-SODIUM CITRATE 334-500 MG/5ML PO SOLN: 30.0000 mL | ORAL | Status: DC | PRN

## 2012-09-22 MED ORDER — ZOLPIDEM TARTRATE 5 MG PO TABS: 5.0000 mg | ORAL_TABLET | Freq: Every evening | ORAL | Status: DC | PRN

## 2012-09-22 MED ORDER — METHYLERGONOVINE MALEATE 0.2 MG/ML IJ SOLN: 0.2000 mg | INTRAMUSCULAR | Status: DC | PRN

## 2012-09-22 MED ORDER — DIPHENHYDRAMINE HCL 25 MG PO CAPS: 25.0000 mg | ORAL_CAPSULE | Freq: Four times a day (QID) | ORAL | Status: DC | PRN

## 2012-09-22 MED ORDER — METHYLERGONOVINE MALEATE 0.2 MG PO TABS: 0.2000 mg | ORAL_TABLET | ORAL | Status: DC | PRN

## 2012-09-22 MED ORDER — MAGNESIUM HYDROXIDE 400 MG/5ML PO SUSP: 30.0000 mL | ORAL | Status: DC | PRN

## 2012-09-22 MED ORDER — EPHEDRINE 5 MG/ML INJ
10.0000 mg | INTRAVENOUS | Status: DC | PRN
  Filled 2012-09-22: qty 4

## 2012-09-22 MED ORDER — LANOLIN HYDROUS EX OINT: TOPICAL_OINTMENT | CUTANEOUS | Status: DC | PRN

## 2012-09-22 MED ORDER — SENNOSIDES-DOCUSATE SODIUM 8.6-50 MG PO TABS
2.0000 | ORAL_TABLET | Freq: Every day | ORAL | Status: DC
  Administered 2012-09-22 – 2012-09-23 (×2): 2 via ORAL

## 2012-09-22 MED ORDER — LACTATED RINGERS IV SOLN: 500.0000 mL | Freq: Once | INTRAVENOUS | Status: DC

## 2012-09-22 MED ORDER — LACTATED RINGERS IV SOLN
INTRAVENOUS | Status: DC
  Administered 2012-09-22: 08:00:00 via INTRAVENOUS

## 2012-09-22 MED ORDER — FERROUS SULFATE 325 (65 FE) MG PO TABS
325.0000 mg | ORAL_TABLET | Freq: Two times a day (BID) | ORAL | Status: DC
  Administered 2012-09-22 – 2012-09-24 (×4): 325 mg via ORAL
  Filled 2012-09-22 (×4): qty 1

## 2012-09-22 MED ORDER — IBUPROFEN 600 MG PO TABS
600.0000 mg | ORAL_TABLET | Freq: Four times a day (QID) | ORAL | Status: DC
  Administered 2012-09-22 – 2012-09-24 (×7): 600 mg via ORAL
  Filled 2012-09-22 (×7): qty 1

## 2012-09-22 MED ORDER — OXYTOCIN 40 UNITS IN LACTATED RINGERS INFUSION - SIMPLE MED: 62.5000 mL/h | INTRAVENOUS | Status: DC | PRN

## 2012-09-22 MED ORDER — ONDANSETRON HCL 4 MG/2ML IJ SOLN: 4.0000 mg | Freq: Four times a day (QID) | INTRAMUSCULAR | Status: DC | PRN

## 2012-09-22 MED ORDER — SIMETHICONE 80 MG PO CHEW: 80.0000 mg | CHEWABLE_TABLET | ORAL | Status: DC | PRN

## 2012-09-22 MED ORDER — PRENATAL MULTIVITAMIN CH
1.0000 | ORAL_TABLET | Freq: Every day | ORAL | Status: DC
  Administered 2012-09-22 – 2012-09-24 (×3): 1 via ORAL
  Filled 2012-09-22 (×3): qty 1

## 2012-09-22 MED ORDER — FENTANYL 2.5 MCG/ML BUPIVACAINE 1/10 % EPIDURAL INFUSION (WH - ANES)
14.0000 mL/h | INTRAMUSCULAR | Status: DC
  Administered 2012-09-22: 14 mL/h via EPIDURAL
  Filled 2012-09-22: qty 125

## 2012-09-22 MED ORDER — DIBUCAINE 1 % RE OINT: 1.0000 "application " | TOPICAL_OINTMENT | RECTAL | Status: DC | PRN

## 2012-09-22 MED ORDER — TETANUS-DIPHTH-ACELL PERTUSSIS 5-2.5-18.5 LF-MCG/0.5 IM SUSP: 0.5000 mL | Freq: Once | INTRAMUSCULAR | Status: DC

## 2012-09-22 MED ORDER — SODIUM BICARBONATE 8.4 % IV SOLN
INTRAVENOUS | Status: DC | PRN
  Administered 2012-09-22: 5 mL via EPIDURAL

## 2012-09-22 MED ORDER — OXYTOCIN 40 UNITS IN LACTATED RINGERS INFUSION - SIMPLE MED: 62.5000 mL/h | INTRAVENOUS | Status: DC

## 2012-09-22 MED ORDER — EPHEDRINE 5 MG/ML INJ: 10.0000 mg | INTRAVENOUS | Status: DC | PRN

## 2012-09-22 MED ORDER — OXYTOCIN 40 UNITS IN LACTATED RINGERS INFUSION - SIMPLE MED
1.0000 m[IU]/min | INTRAVENOUS | Status: DC
  Administered 2012-09-22: 2 m[IU]/min via INTRAVENOUS
  Filled 2012-09-22: qty 1000

## 2012-09-22 MED ORDER — PHENYLEPHRINE 40 MCG/ML (10ML) SYRINGE FOR IV PUSH (FOR BLOOD PRESSURE SUPPORT)
80.0000 ug | PREFILLED_SYRINGE | INTRAVENOUS | Status: DC | PRN
  Filled 2012-09-22: qty 5

## 2012-09-22 MED ORDER — LACTATED RINGERS IV SOLN
500.0000 mL | INTRAVENOUS | Status: DC | PRN
  Administered 2012-09-22: 500 mL via INTRAVENOUS

## 2012-09-22 MED ORDER — ONDANSETRON HCL 4 MG/2ML IJ SOLN: 4.0000 mg | INTRAMUSCULAR | Status: DC | PRN

## 2012-09-22 MED ORDER — ACETAMINOPHEN 325 MG PO TABS: 650.0000 mg | ORAL_TABLET | ORAL | Status: DC | PRN

## 2012-09-22 MED ORDER — BUTORPHANOL TARTRATE 1 MG/ML IJ SOLN: 1.0000 mg | INTRAMUSCULAR | Status: DC | PRN

## 2012-09-22 MED ORDER — BENZOCAINE-MENTHOL 20-0.5 % EX AERO
1.0000 "application " | INHALATION_SPRAY | CUTANEOUS | Status: DC | PRN
  Filled 2012-09-22: qty 56

## 2012-09-22 MED ORDER — OXYTOCIN BOLUS FROM INFUSION: 500.0000 mL | INTRAVENOUS | Status: DC

## 2012-09-22 MED ORDER — DEXTROSE IN LACTATED RINGERS 5 % IV SOLN: INTRAVENOUS | Status: DC

## 2012-09-22 NOTE — Anesthesia Preprocedure Evaluation (Signed)

## 2012-09-22 NOTE — Progress Notes (Signed)
Patient ID: Kirsten Long, female   DOB: [birth year], 26 y.o.   MRN: [id number]  Pt has decided to cancel BTL.  Has never had surgery and she is nervous.  Will get IUD postpartum.

## 2012-09-22 NOTE — Anesthesia Procedure Notes (Signed)

## 2012-09-23 ENCOUNTER — Encounter (HOSPITAL_COMMUNITY): Payer: Self-pay

## 2012-09-23 LAB — CBC
Hemoglobin: 9.9 g/dL — ABNORMAL LOW (ref 12.0–15.0)
MCH: 31.1 pg (ref 26.0–34.0)
MCV: 90.9 fL (ref 78.0–100.0)
Platelets: 260 10*3/uL (ref 150–400)
RBC: 3.18 MIL/uL — ABNORMAL LOW (ref 3.87–5.11)
WBC: 11.9 10*3/uL — ABNORMAL HIGH (ref 4.0–10.5)

## 2012-09-23 MED ORDER — TETANUS-DIPHTH-ACELL PERTUSSIS 5-2.5-18.5 LF-MCG/0.5 IM SUSP
0.5000 mL | Freq: Once | INTRAMUSCULAR | Status: AC
  Administered 2012-09-23: 0.5 mL via INTRAMUSCULAR

## 2012-09-23 NOTE — H&P (Signed)
Kirsten Long is a 26 y.o. female [phone] at 59 2/7 weeks presenting for an elective induction of labor.  Pt is without complaints.  Rare contractions.  Active fetus. Pregnancy has been uncomplicated. Desires pp BTL after delivery.  Maternal Medical History:  Contractions: Frequency: rare.    Fetal activity: Perceived fetal activity is normal.    Prenatal complications: no prenatal complications Prenatal Complications - Diabetes: none.    OB History    Grav Para Term Preterm Abortions TAB SAB Ect Mult Living   4 3 3  0 1 0 1 0 0 3     Past Medical History  Diagnosis Date  . No pertinent past medical history   . Infection     Valtrex for HSV 2009   . HSV (herpes simplex virus) infection    History reviewed. No pertinent past surgical history. Family History: family history includes Cancer in her maternal grandmother; Hypertension in her maternal aunt, maternal grandfather, maternal grandmother, maternal uncle, mother, paternal grandfather, and paternal grandmother; and Stroke in her paternal grandfather and paternal grandmother. Social History:  reports that she has quit smoking. She has never used smokeless tobacco. She reports that she does not drink alcohol or use illicit drugs.   Review of Systems  Gastrointestinal: Negative for abdominal pain.  Genitourinary:       No vaginal bleeding or LOF.   VSS  Maternal Exam:  Abdomen: Patient reports no abdominal tenderness. Estimated fetal weight is 7.5 lbs..   Fetal presentation: vertex  Introitus: Normal vulva. Normal vagina.  Pelvis: adequate for delivery.   Cervix: Cervix evaluated by digital exam.     Fetal Exam Fetal Monitor Review: Variability: moderate (6-25 bpm).   Pattern: accelerations present.    Fetal State Assessment: Category I - tracings are normal.     Physical Exam  Vitals reviewed. Constitutional: She is oriented to person, place, and time. She appears well-developed and well-nourished. No  distress.  HENT:  Head: Normocephalic and atraumatic.  Eyes: Left eye exhibits no discharge.  Neck: Normal range of motion.  Cardiovascular: Normal rate and regular rhythm.   Respiratory: Effort normal and breath sounds normal. No respiratory distress. She has no wheezes.  GI: Soft.  Musculoskeletal: Normal range of motion. She exhibits no edema and no tenderness.  Neurological: She is alert and oriented to person, place, and time.  Skin: Skin is warm and dry. She is not diaphoretic.   Cervix 4-5/80/-2 AROM clear   Prenatal labs: ABO, Rh: A/Positive/-- (04/18 0000) Antibody: Negative (04/18 0000) Rubella: Immune (04/18 0000) RPR: NON REACTIVE (12/04 0740)  HBsAg: Negative (04/18 0000)  HIV: Non-reactive (04/18 0000)  GBS:   Negative  Assessment/Plan: Term pregnancy for elective induction.  Pt counseled on R/B/A of induction.  Pitocin in progress. Epidural per pt request.   [name] 09/23/2012, 12:17 PM

## 2012-09-23 NOTE — Anesthesia Postprocedure Evaluation (Signed)
  Anesthesia Post-op Note  Patient: Kirsten Long  Procedure(s) Performed: * No procedures listed *  Patient Location: Mother/Baby  Anesthesia Type:Epidural  Level of Consciousness: awake  Airway and Oxygen Therapy: Patient Spontanous Breathing  Post-op Pain: mild  Post-op Assessment: Patient's Cardiovascular Status Stable and Respiratory Function Stable  Post-op Vital Signs: stable  Complications: No apparent anesthesia complications

## 2012-09-24 MED ORDER — IBUPROFEN 600 MG PO TABS: 600.0000 mg | ORAL_TABLET | Freq: Four times a day (QID) | ORAL | Status: DC | PRN

## 2012-09-24 NOTE — Discharge Summary (Signed)
Obstetric Discharge Summary Reason for Admission: induction of labor Prenatal Procedures: none Intrapartum Procedures: spontaneous vaginal delivery Postpartum Procedures: none Complications-Operative and Postpartum: none Hemoglobin  Date Value Range Status  09/23/2012 9.9* 12.0 - 15.0 g/dL Final     HCT  Date Value Range Status  09/23/2012 28.9* 36.0 - 46.0 % Final    Physical Exam:  General: alert and cooperative Lochia: appropriate Uterine Fundus: firm Incision: NA DVT Evaluation: No evidence of DVT seen on physical exam.  Discharge Diagnoses: Term Pregnancy-delivered  Discharge Information: Date: 09/24/2012 Activity: pelvic rest Diet: routine Medications: Ibuprofen Condition: stable Instructions: refer to practice specific booklet Discharge to: home Follow-up Information    Follow up with [name], MD. In 6 weeks. (Postpartum ck, IUD insertion (Mirena))    Contact information:   [address], [address] [place] [address] [phone]          Newborn Data: Live born female  Birth Weight: 8 lb 9.6 oz (3900 g) APGAR: 9, 9  Home with mother.  Derrick Orris J. 09/24/2012, 8:04 AM

## 2012-09-24 NOTE — Progress Notes (Signed)
Post Partum Day 2 s/p svd  Subjective: no complaints, up ad lib, voiding and tolerating PO  Objective: Blood pressure 123/80, pulse 89, temperature 98.3 F (36.8 C), temperature source Oral, resp. rate 16, height 5\' 3"  (1.6 m), weight 73.483 kg (162 lb), last menstrual period 12/03/2011, SpO2 98.00%, unknown if currently breastfeeding.  Physical Exam:  General: alert and cooperative Lochia: appropriate Uterine Fundus: firm Incision: na DVT Evaluation: No evidence of DVT seen on physical exam.   Basename 09/23/12 0518 09/22/12 0740  HGB 9.9* 10.9*  HCT 28.9* 32.1*    Assessment/Plan: Discharge home   LOS: 2 days   Kirsten Long J. 09/24/2012, 8:01 AM

## 2012-10-07 ENCOUNTER — Emergency Department (HOSPITAL_COMMUNITY)
Admission: EM | Admit: 2012-10-07 | Discharge: 2012-10-08 | Disposition: A | Discharge status: Home / Self Care | Payer: Managed Care, Other (non HMO) | Attending: Emergency Medicine | Admitting: Emergency Medicine

## 2012-10-07 ENCOUNTER — Encounter (HOSPITAL_COMMUNITY): Payer: Self-pay | Admitting: *Deleted

## 2012-10-07 DIAGNOSIS — M79601 Pain in right arm: Secondary | ICD-10-CM

## 2012-10-07 DIAGNOSIS — Z87891 Personal history of nicotine dependence: Secondary | ICD-10-CM | POA: Insufficient documentation

## 2012-10-07 DIAGNOSIS — M79609 Pain in unspecified limb: Secondary | ICD-10-CM | POA: Insufficient documentation

## 2012-10-07 DIAGNOSIS — Z8619 Personal history of other infectious and parasitic diseases: Secondary | ICD-10-CM | POA: Insufficient documentation

## 2012-10-07 DIAGNOSIS — R209 Unspecified disturbances of skin sensation: Secondary | ICD-10-CM | POA: Insufficient documentation

## 2012-10-07 DIAGNOSIS — Z79899 Other long term (current) drug therapy: Secondary | ICD-10-CM | POA: Insufficient documentation

## 2012-10-07 DIAGNOSIS — N390 Urinary tract infection, site not specified: Secondary | ICD-10-CM

## 2012-10-07 DIAGNOSIS — E876 Hypokalemia: Secondary | ICD-10-CM

## 2012-10-07 NOTE — ED Notes (Signed)
Pt states she gave birth 2 weeks ago.

## 2012-10-07 NOTE — ED Notes (Signed)
Pt c/o numbness and tingling in right arm and arm hurts when moving, denies injury.  Also c/o tingling in lower legs bilaterally, pt ambulatory.

## 2012-10-08 ENCOUNTER — Ambulatory Visit (HOSPITAL_COMMUNITY)
Admission: RE | Admit: 2012-10-08 | Discharge: 2012-10-08 | Disposition: A | Discharge status: Home / Self Care | Payer: Managed Care, Other (non HMO) | Source: Ambulatory Visit | Attending: Emergency Medicine | Admitting: Emergency Medicine

## 2012-10-08 ENCOUNTER — Emergency Department (HOSPITAL_COMMUNITY): Payer: Managed Care, Other (non HMO)

## 2012-10-08 ENCOUNTER — Other Ambulatory Visit (HOSPITAL_COMMUNITY): Payer: Self-pay | Admitting: Emergency Medicine

## 2012-10-08 DIAGNOSIS — M79603 Pain in arm, unspecified: Secondary | ICD-10-CM

## 2012-10-08 DIAGNOSIS — M79609 Pain in unspecified limb: Secondary | ICD-10-CM

## 2012-10-08 LAB — COMPREHENSIVE METABOLIC PANEL
Albumin: 3.4 g/dL — ABNORMAL LOW (ref 3.5–5.2)
Alkaline Phosphatase: 94 U/L (ref 39–117)
BUN: 10 mg/dL (ref 6–23)
Chloride: 107 mEq/L (ref 96–112)
Creatinine, Ser: 0.77 mg/dL (ref 0.50–1.10)
GFR calc Af Amer: >90 mL/min (ref >90)
GFR calc non Af Amer: >90 mL/min (ref >90)
Glucose, Bld: 86 mg/dL (ref 70–99)
Potassium: 2.8 mEq/L — ABNORMAL LOW (ref 3.5–5.1)
Total Bilirubin: 0.4 mg/dL (ref 0.3–1.2)

## 2012-10-08 LAB — URINALYSIS, ROUTINE W REFLEX MICROSCOPIC
Ketones, ur: NEGATIVE mg/dL
Nitrite: NEGATIVE
Protein, ur: NEGATIVE mg/dL
Urobilinogen, UA: 1 mg/dL (ref 0.0–1.0)

## 2012-10-08 LAB — CBC
HCT: 35.7 % — ABNORMAL LOW (ref 36.0–46.0)
Hemoglobin: 12 g/dL (ref 12.0–15.0)
MCV: 90.4 fL (ref 78.0–100.0)
RDW: 13.8 % (ref 11.5–15.5)
WBC: 5.7 10*3/uL (ref 4.0–10.5)

## 2012-10-08 LAB — URINE MICROSCOPIC-ADD ON

## 2012-10-08 MED ORDER — POTASSIUM CHLORIDE CRYS ER 20 MEQ PO TBCR
40.0000 meq | EXTENDED_RELEASE_TABLET | Freq: Once | ORAL | Status: AC
  Administered 2012-10-08: 40 meq via ORAL
  Filled 2012-10-08: qty 2

## 2012-10-08 MED ORDER — ENOXAPARIN SODIUM 80 MG/0.8ML ~~LOC~~ SOLN
1.0000 mg/kg | Freq: Once | SUBCUTANEOUS | Status: AC
  Administered 2012-10-08: 70 mg via SUBCUTANEOUS
  Filled 2012-10-08: qty 0.8

## 2012-10-08 MED ORDER — NITROFURANTOIN MONOHYD MACRO 100 MG PO CAPS: 100.0000 mg | ORAL_CAPSULE | Freq: Two times a day (BID) | ORAL | Status: DC

## 2012-10-08 MED ORDER — POTASSIUM CHLORIDE CRYS ER 20 MEQ PO TBCR: 20.0000 meq | EXTENDED_RELEASE_TABLET | Freq: Two times a day (BID) | ORAL | Status: DC

## 2012-10-08 NOTE — Progress Notes (Signed)
Right:  No evidence of DVT or superficial thrombosis.    

## 2012-10-08 NOTE — ED Provider Notes (Signed)
History     CSN: [id number]  Arrival date & time 10/07/12  2251   First MD Initiated Contact with Patient 10/07/12 2327      Chief Complaint  Patient presents with  . Tingling  . Arm Pain    (Consider location/radiation/quality/duration/timing/severity/associated sxs/prior treatment) HPI Comments: Ms. Kirsten Long presents ambulatory for evaluation.  She is 2 weeks postpartum, describes an uncomplicated pregnancy and delivery, and is currently breastfeeding.  She reports severe right upper extremity pain.  She denies any trauma but reports it hurts to attempt to raise her arm at the shoulder or straighten it at the elbow.  She denies any swelling of the arm.  She has experienced no fevers, palpitations, CP, SOB, NVD, leg pain, or leg swelling.  It does feel like her feet and lower legs are burning or tingling.  She denies any hx of eclampsia, gestational hypertension, or gestational diabetes.  Patient is a 26 y.o. female presenting with arm pain. The history is provided by the patient. No language interpreter was used.  Arm Pain This is a new problem. The current episode started yesterday. The problem occurs constantly. The problem has been gradually worsening. Pertinent negatives include no chest pain, no abdominal pain, no headaches and no shortness of breath. The symptoms are aggravated by bending. The symptoms are relieved by rest (remaining still). She has tried nothing for the symptoms.    Past Medical History  Diagnosis Date  . No pertinent past medical history   . Infection     Valtrex for HSV 2009   . HSV (herpes simplex virus) infection     History reviewed. No pertinent past surgical history.  Family History  Problem Relation Age of Onset  . Hypertension Mother   . Hypertension Maternal Aunt   . Hypertension Maternal Uncle   . Cancer Maternal Grandmother   . Hypertension Maternal Grandmother   . Hypertension Maternal Grandfather   . Hypertension Paternal Grandmother    . Stroke Paternal Grandmother   . Hypertension Paternal Grandfather   . Stroke Paternal Grandfather     History  Substance Use Topics  . Smoking status: Former Games developer  . Smokeless tobacco: Never Used  . Alcohol Use: No     Comment: occassionally    OB History    Grav Para Term Preterm Abortions TAB SAB Ect Mult Living   4 3 3  0 1 0 1 0 0 3      Review of Systems  Constitutional: Negative for fever, chills, diaphoresis, activity change, appetite change and fatigue.  HENT: Negative for congestion, sore throat, facial swelling, rhinorrhea, sneezing, mouth sores, neck pain and neck stiffness.   Eyes: Negative.   Respiratory: Negative for cough, choking, chest tightness and shortness of breath.   Cardiovascular: Negative for chest pain, palpitations and leg swelling.  Gastrointestinal: Negative for nausea, vomiting, abdominal pain, diarrhea and constipation.  Genitourinary: Negative.   Musculoskeletal: Negative for myalgias, back pain, joint swelling and gait problem.  Skin: Negative for color change, pallor, rash and wound.  Neurological: Negative for tremors, syncope, weakness, light-headedness and headaches.  Hematological: Negative for adenopathy. Does not bruise/bleed easily.    Allergies  Review of patient's allergies indicates no known allergies.  Home Medications   Current Outpatient Rx  Name  Route  Sig  Dispense  Refill  . IBUPROFEN 600 MG PO TABS   Oral   Take 1 tablet (600 mg total) by mouth every 6 (six) hours as needed for pain.   30  tablet   1   . PRENATAL MULTIVITAMIN CH   Oral   Take 1 tablet by mouth every morning.         Marland Kitchen VALACYCLOVIR HCL 500 MG PO TABS   Oral   Take 500 mg by mouth 2 (two) times daily.           BP 145/79  Pulse 49  Temp 98.2 F (36.8 C) (Oral)  SpO2 100%  Breastfeeding? Yes  Physical Exam  Nursing note and vitals reviewed. Constitutional: She is oriented to person, place, and time. She appears well-developed and  well-nourished. No distress. She is not intubated.  HENT:  Head: Normocephalic and atraumatic.  Right Ear: External ear normal.  Left Ear: External ear normal.  Nose: Nose normal.  Mouth/Throat: Oropharynx is clear and moist. No oropharyngeal exudate.  Eyes: Conjunctivae normal and EOM are normal. Pupils are equal, round, and reactive to light. Right eye exhibits no discharge. Left eye exhibits no discharge. No scleral icterus.  Neck: Normal range of motion. Neck supple. No JVD present. No tracheal deviation present. No thyromegaly present.  Cardiovascular: Regular rhythm, normal heart sounds and intact distal pulses.  Bradycardia present.  Exam reveals no gallop and no friction rub.   No murmur heard. Pulmonary/Chest: Effort normal and breath sounds normal. No accessory muscle usage or stridor. No apnea, not tachypneic and not bradypneic. She is not intubated. No respiratory distress. She has no decreased breath sounds. She has no wheezes. She has no rhonchi. She has no rales. She exhibits no tenderness, no bony tenderness, no crepitus, no deformity, no swelling and no retraction.  Abdominal: Soft. Bowel sounds are normal. She exhibits no distension, no abdominal bruit, no ascites, no pulsatile midline mass and no mass. There is no hepatosplenomegaly. There is generalized tenderness (mild diffuse). There is no rebound, no guarding and no CVA tenderness. No hernia.  Musculoskeletal: Normal range of motion. She exhibits tenderness (right upper arm.  most notably over the biceps muscle.  pain is exacerbated by extension at the ellbow and abduction at the shoulder.). She exhibits no edema.       Cervical back: Normal.       Right upper arm: She exhibits tenderness. She exhibits no bony tenderness, no swelling, no edema, no deformity and no laceration.       Right forearm: Normal. She exhibits no tenderness, no bony tenderness, no swelling, no edema, no deformity and no laceration.       Right hand:  Normal.  Lymphadenopathy:    She has no cervical adenopathy.       Right axillary: No pectoral and no lateral adenopathy present.       Left axillary: No pectoral and no lateral adenopathy present. Neurological: She is alert and oriented to person, place, and time. No cranial nerve deficit.  Skin: Skin is warm and dry. No rash noted. She is not diaphoretic. No erythema. No pallor.  Psychiatric: She has a normal mood and affect. Her behavior is normal.    ED Course  Procedures (including critical care time)  Labs Reviewed  CBC - Abnormal; Notable for the following:    HCT 35.7 (*)     All other components within normal limits  COMPREHENSIVE METABOLIC PANEL - Abnormal; Notable for the following:    Potassium 2.8 (*)     Albumin 3.4 (*)     All other components within normal limits  URINALYSIS, ROUTINE W REFLEX MICROSCOPIC   No results found.  No diagnosis found.   Date: 10/08/2012  Rate: 55 bpm  Rhythm: sinus  QRS Axis: normal  Intervals: normal  ST/T Wave abnormalities: nonspecific T wave changes in V2-V4  Conduction Disutrbances:none  Narrative Interpretation:   Old EKG Reviewed: none available      MDM  Pt is 2 wks postpartum and presents with severe, atraumatic right arm pain.  She appears nontoxic, note stable VS, NAD.  She denies chest pain or shortness of breath and also reports having tingling and burning in hr bilateral feet.  She has no swelling or edema, her BP is not elevated, and she has no history of preeclampsia, hypertension, or diabetes.  Will obtain basic labs, u/a, CXR, EKG, and an ultrasound of the extremity to assess for DVT.  1610.  Pt appears uncomfortable.  She complains of pressure and tenderness in her breast secondary to engorgement.  Note evidence of a UTI but no proteinuria.  Note also hypokalemia.  Will replete her potassium with po potassium.  Will administer lovenox and discharge her home to return for an ultrasound of the arm in the  morning.  She appears nontoxic, NAD.      Tobin Chad, MD 10/08/12 [phone]

## 2012-10-08 NOTE — ED Notes (Signed)
Discharge information and instructions provided. Patient aware that she needs to return to vascular lab in the morning between 8a-9a.

## 2012-10-14 ENCOUNTER — Inpatient Hospital Stay (EMERGENCY_DEPARTMENT_HOSPITAL)
Admission: AD | Admit: 2012-10-14 | Discharge: 2012-10-14 | Disposition: A | Discharge status: Home / Self Care | Payer: Managed Care, Other (non HMO) | Source: Ambulatory Visit | Attending: Emergency Medicine | Admitting: Emergency Medicine

## 2012-10-14 ENCOUNTER — Encounter (HOSPITAL_COMMUNITY): Payer: Self-pay | Admitting: Emergency Medicine

## 2012-10-14 ENCOUNTER — Encounter (HOSPITAL_COMMUNITY): Payer: Self-pay | Admitting: *Deleted

## 2012-10-14 ENCOUNTER — Inpatient Hospital Stay (HOSPITAL_COMMUNITY): Payer: Managed Care, Other (non HMO)

## 2012-10-14 ENCOUNTER — Observation Stay (HOSPITAL_COMMUNITY)
Admission: EM | Admit: 2012-10-14 | Discharge: 2012-10-16 | Disposition: A | Discharge status: Home / Self Care | Payer: Managed Care, Other (non HMO) | Attending: Surgery | Admitting: Surgery

## 2012-10-14 DIAGNOSIS — Z8619 Personal history of other infectious and parasitic diseases: Secondary | ICD-10-CM | POA: Insufficient documentation

## 2012-10-14 DIAGNOSIS — K8000 Calculus of gallbladder with acute cholecystitis without obstruction: Secondary | ICD-10-CM | POA: Insufficient documentation

## 2012-10-14 DIAGNOSIS — K810 Acute cholecystitis: Secondary | ICD-10-CM | POA: Diagnosis present

## 2012-10-14 DIAGNOSIS — O9089 Other complications of the puerperium, not elsewhere classified: Principal | ICD-10-CM | POA: Insufficient documentation

## 2012-10-14 DIAGNOSIS — K8010 Calculus of gallbladder with chronic cholecystitis without obstruction: Secondary | ICD-10-CM

## 2012-10-14 DIAGNOSIS — Z87440 Personal history of urinary (tract) infections: Secondary | ICD-10-CM | POA: Insufficient documentation

## 2012-10-14 DIAGNOSIS — K8042 Calculus of bile duct with acute cholecystitis without obstruction: Secondary | ICD-10-CM

## 2012-10-14 DIAGNOSIS — K819 Cholecystitis, unspecified: Secondary | ICD-10-CM

## 2012-10-14 DIAGNOSIS — Z01812 Encounter for preprocedural laboratory examination: Secondary | ICD-10-CM | POA: Insufficient documentation

## 2012-10-14 DIAGNOSIS — O9853 Other viral diseases complicating the puerperium: Secondary | ICD-10-CM | POA: Insufficient documentation

## 2012-10-14 DIAGNOSIS — R1011 Right upper quadrant pain: Secondary | ICD-10-CM | POA: Insufficient documentation

## 2012-10-14 DIAGNOSIS — B009 Herpesviral infection, unspecified: Secondary | ICD-10-CM | POA: Insufficient documentation

## 2012-10-14 DIAGNOSIS — Z87891 Personal history of nicotine dependence: Secondary | ICD-10-CM | POA: Insufficient documentation

## 2012-10-14 DIAGNOSIS — R110 Nausea: Secondary | ICD-10-CM | POA: Insufficient documentation

## 2012-10-14 HISTORY — DX: Urinary tract infection, site not specified: N39.0

## 2012-10-14 LAB — COMPREHENSIVE METABOLIC PANEL
Alkaline Phosphatase: 156 U/L — ABNORMAL HIGH (ref 39–117)
BUN: 9 mg/dL (ref 6–23)
Chloride: 108 mEq/L (ref 96–112)
Creatinine, Ser: 0.71 mg/dL (ref 0.50–1.10)
GFR calc Af Amer: >90 mL/min (ref >90)
GFR calc non Af Amer: >90 mL/min (ref >90)
Glucose, Bld: 90 mg/dL (ref 70–99)
Potassium: 3.7 mEq/L (ref 3.5–5.1)
Total Bilirubin: 0.7 mg/dL (ref 0.3–1.2)

## 2012-10-14 LAB — URINALYSIS, ROUTINE W REFLEX MICROSCOPIC
Glucose, UA: NEGATIVE mg/dL
Ketones, ur: NEGATIVE mg/dL
Leukocytes, UA: NEGATIVE
Protein, ur: NEGATIVE mg/dL
pH: 7 (ref 5.0–8.0)

## 2012-10-14 LAB — CBC
HCT: 38.4 % (ref 36.0–46.0)
Hemoglobin: 12.5 g/dL (ref 12.0–15.0)
MCH: 30.8 pg (ref 26.0–34.0)
MCHC: 33.8 g/dL (ref 30.0–36.0)
MCV: 92.8 fL (ref 78.0–100.0)
MCV: 91.2 fL (ref 78.0–100.0)
Platelets: 368 10*3/uL (ref 150–400)
RDW: 14 % (ref 11.5–15.5)
RDW: 13.8 % (ref 11.5–15.5)
WBC: 6.6 10*3/uL (ref 4.0–10.5)

## 2012-10-14 LAB — URINE MICROSCOPIC-ADD ON

## 2012-10-14 LAB — CREATININE, SERUM: Creatinine, Ser: 0.74 mg/dL (ref 0.50–1.10)

## 2012-10-14 LAB — LIPASE, BLOOD: Lipase: 31 U/L (ref 11–59)

## 2012-10-14 MED ORDER — PIPERACILLIN-TAZOBACTAM 3.375 G IVPB 30 MIN
3.3750 g | Freq: Once | INTRAVENOUS | Status: AC
  Administered 2012-10-14: 3.375 g via INTRAVENOUS
  Filled 2012-10-14: qty 50

## 2012-10-14 MED ORDER — DIPHENHYDRAMINE HCL 12.5 MG/5ML PO ELIX: 12.5000 mg | ORAL_SOLUTION | Freq: Four times a day (QID) | ORAL | Status: DC | PRN

## 2012-10-14 MED ORDER — MORPHINE SULFATE 2 MG/ML IJ SOLN
1.0000 mg | INTRAMUSCULAR | Status: DC | PRN
  Administered 2012-10-15 – 2012-10-16 (×4): 1 mg via INTRAVENOUS
  Filled 2012-10-14 (×4): qty 1

## 2012-10-14 MED ORDER — MORPHINE SULFATE 4 MG/ML IJ SOLN
6.0000 mg | Freq: Once | INTRAMUSCULAR | Status: AC
  Administered 2012-10-14: 6 mg via INTRAVENOUS
  Filled 2012-10-14: qty 2

## 2012-10-14 MED ORDER — KCL IN DEXTROSE-NACL 20-5-0.45 MEQ/L-%-% IV SOLN
INTRAVENOUS | Status: DC
  Administered 2012-10-14: 22:00:00 via INTRAVENOUS
  Administered 2012-10-15: 100 mL via INTRAVENOUS
  Administered 2012-10-16: 1000 mL via INTRAVENOUS
  Filled 2012-10-14 (×7): qty 1000

## 2012-10-14 MED ORDER — DIPHENHYDRAMINE HCL 50 MG/ML IJ SOLN: 12.5000 mg | Freq: Four times a day (QID) | INTRAMUSCULAR | Status: DC | PRN

## 2012-10-14 MED ORDER — ONDANSETRON HCL 4 MG/2ML IJ SOLN: 4.0000 mg | Freq: Four times a day (QID) | INTRAMUSCULAR | Status: DC | PRN

## 2012-10-14 MED ORDER — HEPARIN SODIUM (PORCINE) 5000 UNIT/ML IJ SOLN
5000.0000 [IU] | Freq: Three times a day (TID) | INTRAMUSCULAR | Status: DC
  Administered 2012-10-14: 5000 [IU] via SUBCUTANEOUS
  Filled 2012-10-14 (×5): qty 1

## 2012-10-14 MED ORDER — SODIUM CHLORIDE 0.9 % IV SOLN
Freq: Once | INTRAVENOUS | Status: AC
  Administered 2012-10-14: 15:00:00 via INTRAVENOUS

## 2012-10-14 NOTE — ED Provider Notes (Signed)
History     CSN: [id number]  Arrival date & time 10/14/12  1502   First MD Initiated Contact with Patient 10/14/12 1505      Chief complaint: Abdominal pain   HPI Patient reports worsening right upper quadrant abdominal pain since earlier this morning with nausea no vomiting.  She is 4 weeks postpartum.  She denies fevers or chills.  She was seen at an outside emergency department and diagnosed with cholelithiasis and possible early cholecystitis and sent to the emergency department for further evaluation.  She received a dose of IV Zosyn at the outside hospital.  The patient reports ongoing right upper quadrant pain at this time although states it feels somewhat better since her pain medication.  She has no other complaints.  Her symptoms are mild to moderate in severity with pain that is still 5/10.  She last had something to eat or drink at 6 AM this morning   Past Medical History  Diagnosis Date  . Infection     Valtrex for HSV 2009   . HSV (herpes simplex virus) infection   . Urinary tract infection     No past surgical history on file.  Family History  Problem Relation Age of Onset  . Hypertension Mother   . Hypertension Maternal Aunt   . Hypertension Maternal Uncle   . Cancer Maternal Grandmother   . Hypertension Maternal Grandmother   . Hypertension Maternal Grandfather   . Hypertension Paternal Grandmother   . Stroke Paternal Grandmother   . Hypertension Paternal Grandfather   . Stroke Paternal Grandfather     History  Substance Use Topics  . Smoking status: Former Games developer  . Smokeless tobacco: Never Used  . Alcohol Use: No     Comment: occassionally    OB History    Grav Para Term Preterm Abortions TAB SAB Ect Mult Living   4 3 3  0 1 0 1 0 0 3      Review of Systems  All other systems reviewed and are negative.    Allergies  Review of patient's allergies indicates no known allergies.  Home Medications   Current Outpatient Rx  Name  Route  Sig   Dispense  Refill  . IBUPROFEN 600 MG PO TABS   Oral   Take 1 tablet (600 mg total) by mouth every 6 (six) hours as needed for pain.   30 tablet   1   . NITROFURANTOIN MONOHYD MACRO 100 MG PO CAPS   Oral   Take 100 mg by mouth 2 (two) times daily.         Marland Kitchen PRENATAL MULTIVITAMIN CH   Oral   Take 1 tablet by mouth every morning.         Marland Kitchen VALACYCLOVIR HCL 500 MG PO TABS   Oral   Take 500 mg by mouth 2 (two) times daily.           There were no vitals taken for this visit.  Physical Exam  Nursing note and vitals reviewed. Constitutional: She is oriented to person, place, and time. She appears well-developed and well-nourished. No distress.  HENT:  Head: Normocephalic and atraumatic.  Eyes: EOM are normal.  Neck: Normal range of motion.  Cardiovascular: Normal rate, regular rhythm and normal heart sounds.   Pulmonary/Chest: Effort normal and breath sounds normal.  Abdominal: Soft. She exhibits no distension.       Right upper quadrant tenderness with guarding or rebound.  Musculoskeletal: Normal range of motion.  Neurological: She is alert and oriented to person, place, and time.  Skin: Skin is warm and dry.  Psychiatric: She has a normal mood and affect. Judgment normal.    ED Course  Procedures (including critical care time)  Labs Reviewed - No data to display US Abdomen Complete  10/14/2012  *RADIOLOGY REPORT*  Clinical Data:  Right upper quadrant pain.  4 weeks postpartum.  ABDOMINAL ULTRASOUND COMPLETE  Comparison:  Obstetric ultrasound November 2010  Findings:  Gallbladder:  There are multiple tiny gallstones layering dependently within the gallbladder.  Gallbladder wall thickness measures up to 3.8 mm, mildly thickened.The sonographic Murphy's sign is positive this tube (the patient is very tender with direct palpation over the gallbladder during imaging)  Common Bile Duct:  Within normal limits in caliber. Measures 2.4 mm  Liver: No focal mass lesion  identified.  Within normal limits in parenchymal echogenicity.  IVC:  Appears normal.  Pancreas:  No abnormality identified.  Spleen:  Within normal limits in size and echotexture.  Right kidney:  Normal in size and parenchymal echogenicity.  No evidence of mass or hydronephrosis.  Left kidney:  Normal in size and parenchymal echogenicity.  No evidence of mass or hydronephrosis.  Abdominal Aorta:  No aneurysm identified.  No ascites is identified.  The  IMPRESSION: Findings suspicious for early acute cholecystitis.  This study is made a call report.   Original Report Authenticated By: Britta Mccreedy, M.D.    I personally reviewed the imaging tests through PACS system I reviewed available ER/hospitalization records through the EMR   1. Cholecystitis       MDM  3:18 PM Spoke with Dr Ezzard Standing, will evaluate in the ER        Lyanne Co, MD 10/14/12 [phone]

## 2012-10-14 NOTE — ED Notes (Signed)
[id number]<AV> Expected date:<BR> Expected time:<BR> Means of arrival:<BR> Comments:<BR> Hold for [facility] transfer/ acute cholecystosis/ 4wks postpartum

## 2012-10-14 NOTE — ED Notes (Signed)
PER [facility]- pt transferred from [facility] with c/o cholecystitis and postpartum x4 wks

## 2012-10-14 NOTE — MAU Note (Addendum)
Vaginal delivery 12/4. No complications. C/O sudden onset upper abdominal pain this AM. Arrived via EMS. States she was in bed, started to get up when the pain started. States she feels better sitting upright. Felt some nausea on the way to hospital. No vomiting. Was given Zofran and Fentanyl in transit. Points to mid upper abdomen, just below sternum. Slightly tender to touch currently, but states is much less since pain med. States she went to [facility] last week and is taking medication for UTI. Was also told her potassium was low and gave her supplement to take at home.

## 2012-10-14 NOTE — ED Notes (Signed)
PER [facility]- pt transferred from [facility] with c/o cholecystitis.  Pt arrived alert and oriented and denies pain. Pt is 3wks postpartum.

## 2012-10-14 NOTE — H&P (Signed)
Chief Complaint:  Acute cholecystitis  History of Present Illness:  Kirsten Long is an 26 y.o. female who is 4 weeks postpartum and was brought in by ambulance this morning with severe upper abdominal pain. She had a gallbladder ultrasound which showed a positive Murphy sign and numerous small stones. She was seen in the ER tonight by me and I discussed laparoscopic cholecystectomy with her in some detail. She wants to be admitted for surgical intervention tomorrow by Dr. Ezzard Standing who is the Dr. Rennis Petty the week.  Past Medical History  Diagnosis Date  . Infection     Valtrex for HSV 2009   . HSV (herpes simplex virus) infection   . Urinary tract infection     History reviewed. No pertinent past surgical history.  No current facility-administered medications for this encounter.   Current Outpatient Prescriptions  Medication Sig Dispense Refill  . ibuprofen (ADVIL,MOTRIN) 600 MG tablet Take 1 tablet (600 mg total) by mouth every 6 (six) hours as needed for pain.  30 tablet  1  . nitrofurantoin, macrocrystal-monohydrate, (MACROBID) 100 MG capsule Take 100 mg by mouth 2 (two) times daily.      . Prenatal Vit-Fe Fumarate-FA (PRENATAL MULTIVITAMIN) TABS Take 1 tablet by mouth every morning.      . valACYclovir (VALTREX) 500 MG tablet Take 500 mg by mouth 2 (two) times daily.       Review of patient's allergies indicates no known allergies. Family History  Problem Relation Age of Onset  . Hypertension Mother   . Hypertension Maternal Aunt   . Hypertension Maternal Uncle   . Cancer Maternal Grandmother   . Hypertension Maternal Grandmother   . Hypertension Maternal Grandfather   . Hypertension Paternal Grandmother   . Stroke Paternal Grandmother   . Hypertension Paternal Grandfather   . Stroke Paternal Grandfather    Social History:   reports that she quit smoking about 9 months ago. She has never used smokeless tobacco. She reports that she drinks alcohol. She reports that she does not use  illicit drugs.   REVIEW OF SYSTEMS - PERTINENT POSITIVES ONLY: Noncontributory except she is currently lactating and breast-feeding her for-week-old child.  Physical Exam:   Blood pressure 124/81, pulse 51, temperature 97.9 F (36.6 C), resp. rate 16, SpO2 99.00%, currently breastfeeding. There is no height or weight on file to calculate BMI.  Gen:  WDWN African American female NAD  Neurological: Alert and oriented to person, place, and time. Motor and sensory function is grossly intact  Head: Normocephalic and atraumatic.  Eyes: Conjunctivae are normal. Pupils are equal, round, and reactive to light. No scleral icterus.  Neck: Normal range of motion. Neck supple. No tracheal deviation or thyromegaly present.  Cardiovascular:  SR without murmurs or gallops.  No carotid bruits Respiratory: Effort normal.  No respiratory distress. No chest wall tenderness. Breath sounds normal.  No wheezes, rales or rhonchi.  Abdomen:  Residual soreness in the right upper quadrant without rebound or guarding GU: Musculoskeletal: Normal range of motion. Extremities are nontender. No cyanosis, edema or clubbing noted Lymphadenopathy: No cervical, preauricular, postauricular or axillary adenopathy is present Skin: Skin is warm and dry. No rash noted. No diaphoresis. No erythema. No pallor. Pscyh: Normal mood and affect. Behavior is normal. Judgment and thought content normal.   LABORATORY RESULTS: Results for orders placed during the hospital encounter of 10/14/12 (from the past 48 hour(s))  URINALYSIS, ROUTINE W REFLEX MICROSCOPIC     Status: Abnormal   Collection Time  10/14/12 10:16 AM      Component Value Range Comment   Color, Urine YELLOW  YELLOW    APPearance CLEAR  CLEAR    Specific Gravity, Urine 1.020  1.005 - 1.030    pH 7.0  5.0 - 8.0    Glucose, UA NEGATIVE  NEGATIVE mg/dL    Hgb urine dipstick SMALL (*) NEGATIVE    Bilirubin Urine NEGATIVE  NEGATIVE    Ketones, ur NEGATIVE  NEGATIVE  mg/dL    Protein, ur NEGATIVE  NEGATIVE mg/dL    Urobilinogen, UA 1.0  0.0 - 1.0 mg/dL    Nitrite NEGATIVE  NEGATIVE    Leukocytes, UA NEGATIVE  NEGATIVE   URINE MICROSCOPIC-ADD ON     Status: Normal   Collection Time   10/14/12 10:16 AM      Component Value Range Comment   Squamous Epithelial / LPF RARE  RARE    RBC / HPF 0-2  <3 RBC/hpf   CBC     Status: Normal   Collection Time   10/14/12 10:40 AM      Component Value Range Comment   WBC 6.6  4.0 - 10.5 K/uL    RBC 4.14  3.87 - 5.11 MIL/uL    Hemoglobin 12.5  12.0 - 15.0 g/dL    HCT 16.1  09.6 - 04.5 %    MCV 92.8  78.0 - 100.0 fL    MCH 30.2  26.0 - 34.0 pg    MCHC 32.6  30.0 - 36.0 g/dL    RDW 40.9  81.1 - 91.4 %    Platelets 347  150 - 400 K/uL   COMPREHENSIVE METABOLIC PANEL     Status: Abnormal   Collection Time   10/14/12 10:40 AM      Component Value Range Comment   Sodium 142  135 - 145 mEq/L    Potassium 3.7  3.5 - 5.1 mEq/L    Chloride 108  96 - 112 mEq/L    CO2 28  19 - 32 mEq/L    Glucose, Bld 90  70 - 99 mg/dL    BUN 9  6 - 23 mg/dL    Creatinine, Ser 7.82  0.50 - 1.10 mg/dL    Calcium 9.2  8.4 - 95.6 mg/dL    Total Protein 6.5  6.0 - 8.3 g/dL    Albumin 3.5  3.5 - 5.2 g/dL    AST 90 (*) 0 - 37 U/L    ALT 31  0 - 35 U/L    Alkaline Phosphatase 156 (*) 39 - 117 U/L    Total Bilirubin 0.7  0.3 - 1.2 mg/dL    GFR calc non Af Amer >90  >90 mL/min    GFR calc Af Amer >90  >90 mL/min   LIPASE, BLOOD     Status: Normal   Collection Time   10/14/12 10:40 AM      Component Value Range Comment   Lipase 31  11 - 59 U/L     RADIOLOGY RESULTS: US Abdomen Complete  10/14/2012  *RADIOLOGY REPORT*  Clinical Data:  Right upper quadrant pain.  4 weeks postpartum.  ABDOMINAL ULTRASOUND COMPLETE  Comparison:  Obstetric ultrasound November 2010  Findings:  Gallbladder:  There are multiple tiny gallstones layering dependently within the gallbladder.  Gallbladder wall thickness measures up to 3.8 mm, mildly  thickened.The sonographic Murphy's sign is positive this tube (the patient is very tender with direct palpation over the gallbladder during imaging)  Common  Bile Duct:  Within normal limits in caliber. Measures 2.4 mm  Liver: No focal mass lesion identified.  Within normal limits in parenchymal echogenicity.  IVC:  Appears normal.  Pancreas:  No abnormality identified.  Spleen:  Within normal limits in size and echotexture.  Right kidney:  Normal in size and parenchymal echogenicity.  No evidence of mass or hydronephrosis.  Left kidney:  Normal in size and parenchymal echogenicity.  No evidence of mass or hydronephrosis.  Abdominal Aorta:  No aneurysm identified.  No ascites is identified.  The  IMPRESSION: Findings suspicious for early acute cholecystitis.  This study is made a call report.   Original Report Authenticated By: Britta Mccreedy, M.D.     Problem List: Patient Active Problem List  Diagnosis  . Cholecystitis, acute    Assessment & Plan: Acute cholecystitis Admit with IV antibiotics and plan surgical intervention Friday, 10/15/2012    Matt B. Daphine Deutscher, MD, [facility] [phone] beeper [phone]  10/14/2012 7:12 PM

## 2012-10-14 NOTE — MAU Provider Note (Signed)
History     CSN: [id number]  Arrival date and time: 10/14/12 0944   First Provider Initiated Contact with Patient 10/14/12 1013      No chief complaint on file.  HPI  Kirsten Long is a 26 y.o. [phone] at 4 weeks PP who presents today with RUQ pain. She states that this morning she started having 10/10 RUQ pain. She called EMS and arrived here via EMS.   Past Medical History  Diagnosis Date  . Infection     Valtrex for HSV 2009   . HSV (herpes simplex virus) infection   . Urinary tract infection     No past surgical history on file.  Family History  Problem Relation Age of Onset  . Hypertension Mother   . Hypertension Maternal Aunt   . Hypertension Maternal Uncle   . Cancer Maternal Grandmother   . Hypertension Maternal Grandmother   . Hypertension Maternal Grandfather   . Hypertension Paternal Grandmother   . Stroke Paternal Grandmother   . Hypertension Paternal Grandfather   . Stroke Paternal Grandfather     History  Substance Use Topics  . Smoking status: Former Games developer  . Smokeless tobacco: Never Used  . Alcohol Use: No     Comment: occassionally    Allergies: No Known Allergies  Prescriptions prior to admission  Medication Sig Dispense Refill  . ibuprofen (ADVIL,MOTRIN) 600 MG tablet Take 1 tablet (600 mg total) by mouth every 6 (six) hours as needed for pain.  30 tablet  1  . nitrofurantoin, macrocrystal-monohydrate, (MACROBID) 100 MG capsule Take 100 mg by mouth 2 (two) times daily.      . Prenatal Vit-Fe Fumarate-FA (PRENATAL MULTIVITAMIN) TABS Take 1 tablet by mouth every morning.      . valACYclovir (VALTREX) 500 MG tablet Take 500 mg by mouth 2 (two) times daily.      . [DISCONTINUED] nitrofurantoin, macrocrystal-monohydrate, (MACROBID) 100 MG capsule Take 1 capsule (100 mg total) by mouth 2 (two) times daily.  10 capsule  0    Review of Systems  Constitutional: Negative for fever.  Eyes: Negative for blurred vision.  Gastrointestinal:  Positive for nausea and abdominal pain. Negative for vomiting, diarrhea and constipation.  Genitourinary: Negative for dysuria, urgency and frequency.  Musculoskeletal: Negative for myalgias.  Neurological: Negative for dizziness and headaches.   Physical Exam   Blood pressure 131/90, pulse 55, temperature 97.6 F (36.4 C), temperature source Oral, resp. rate 18, SpO2 98.00%, currently breastfeeding.  Physical Exam  Nursing note and vitals reviewed. Constitutional: She is oriented to person, place, and time. She appears well-developed and well-nourished. No distress.  Cardiovascular: Normal rate.   Respiratory: Effort normal.  GI: Soft. She exhibits no distension. There is tenderness (RUQ tenderness).  Neurological: She is alert and oriented to person, place, and time.  Skin: Skin is warm and dry.  Psychiatric: She has a normal mood and affect.    MAU Course  Procedures  *RADIOLOGY REPORT*  Clinical Data: Right upper quadrant pain. 4 weeks postpartum.  ABDOMINAL ULTRASOUND COMPLETE  Comparison: Obstetric ultrasound November 2010  Findings:  Gallbladder: There are multiple tiny gallstones layering  dependently within the gallbladder. Gallbladder wall thickness  measures up to 3.8 mm, mildly thickened.The sonographic Murphy's  sign is positive this tube (the patient is very tender with direct  palpation over the gallbladder during imaging)  Common Bile Duct: Within normal limits in caliber. Measures 2.4 mm  Liver: No focal mass lesion identified. Within normal limits in  parenchymal echogenicity.  IVC: Appears normal.  Pancreas: No abnormality identified.  Spleen: Within normal limits in size and echotexture.  Right kidney: Normal in size and parenchymal echogenicity. No  evidence of mass or hydronephrosis.  Left kidney: Normal in size and parenchymal echogenicity. No  evidence of mass or hydronephrosis.  Abdominal Aorta: No aneurysm identified.  No ascites is identified.  The  IMPRESSION:  Findings suspicious for early acute cholecystitis.  This study is made a call report.  Original Report Authenticated By: Britta Mccreedy, M.D.  Results for orders placed during the hospital encounter of 10/14/12 (from the past 24 hour(s))  URINALYSIS, ROUTINE W REFLEX MICROSCOPIC     Status: Abnormal   Collection Time   10/14/12 10:16 AM      Component Value Range   Color, Urine YELLOW  YELLOW   APPearance CLEAR  CLEAR   Specific Gravity, Urine 1.020  1.005 - 1.030   pH 7.0  5.0 - 8.0   Glucose, UA NEGATIVE  NEGATIVE mg/dL   Hgb urine dipstick SMALL (*) NEGATIVE   Bilirubin Urine NEGATIVE  NEGATIVE   Ketones, ur NEGATIVE  NEGATIVE mg/dL   Protein, ur NEGATIVE  NEGATIVE mg/dL   Urobilinogen, UA 1.0  0.0 - 1.0 mg/dL   Nitrite NEGATIVE  NEGATIVE   Leukocytes, UA NEGATIVE  NEGATIVE  URINE MICROSCOPIC-ADD ON     Status: Normal   Collection Time   10/14/12 10:16 AM      Component Value Range   Squamous Epithelial / LPF RARE  RARE   RBC / HPF 0-2  <3 RBC/hpf  CBC     Status: Normal   Collection Time   10/14/12 10:40 AM      Component Value Range   WBC 6.6  4.0 - 10.5 K/uL   RBC 4.14  3.87 - 5.11 MIL/uL   Hemoglobin 12.5  12.0 - 15.0 g/dL   HCT 16.1  09.6 - 04.5 %   MCV 92.8  78.0 - 100.0 fL   MCH 30.2  26.0 - 34.0 pg   MCHC 32.6  30.0 - 36.0 g/dL   RDW 40.9  81.1 - 91.4 %   Platelets 347  150 - 400 K/uL  COMPREHENSIVE METABOLIC PANEL     Status: Abnormal   Collection Time   10/14/12 10:40 AM      Component Value Range   Sodium 142  135 - 145 mEq/L   Potassium 3.7  3.5 - 5.1 mEq/L   Chloride 108  96 - 112 mEq/L   CO2 28  19 - 32 mEq/L   Glucose, Bld 90  70 - 99 mg/dL   BUN 9  6 - 23 mg/dL   Creatinine, Ser 7.82  0.50 - 1.10 mg/dL   Calcium 9.2  8.4 - 95.6 mg/dL   Total Protein 6.5  6.0 - 8.3 g/dL   Albumin 3.5  3.5 - 5.2 g/dL   AST 90 (*) 0 - 37 U/L   ALT 31  0 - 35 U/L   Alkaline Phosphatase 156 (*) 39 - 117 U/L   Total Bilirubin 0.7  0.3 - 1.2 mg/dL    GFR calc non Af Amer >90  >90 mL/min   GFR calc Af Amer >90  >90 mL/min  LIPASE, BLOOD     Status: Normal   Collection Time   10/14/12 10:40 AM      Component Value Range   Lipase 31  11 - 59 U/L    1230: Spoke with  Dr. Richardson Dopp, transfer to [facility] for surgical consult 12:33: Spoke with Dr. Dessa Phi at [facility]. Ok to transfer to [employer]. IV Zosyn 3.375mg  now.  Assessment and Plan   1. Cholecystitis, acute with cholelithiasis    Start IV ZOSYN Transfer to [facility] for surgical consult.   Tawnya Crook 10/14/2012, 10:30 AM

## 2012-10-15 ENCOUNTER — Observation Stay (HOSPITAL_COMMUNITY): Payer: Managed Care, Other (non HMO) | Admitting: Anesthesiology

## 2012-10-15 ENCOUNTER — Observation Stay (HOSPITAL_COMMUNITY): Payer: Managed Care, Other (non HMO)

## 2012-10-15 ENCOUNTER — Encounter (HOSPITAL_COMMUNITY): Admission: EM | Disposition: A | Discharge status: Home / Self Care | Payer: Self-pay | Source: Home / Self Care

## 2012-10-15 ENCOUNTER — Encounter (HOSPITAL_COMMUNITY): Payer: Self-pay | Admitting: Anesthesiology

## 2012-10-15 DIAGNOSIS — K8010 Calculus of gallbladder with chronic cholecystitis without obstruction: Secondary | ICD-10-CM

## 2012-10-15 HISTORY — PX: CHOLECYSTECTOMY: SHX55

## 2012-10-15 LAB — SURGICAL PCR SCREEN
MRSA, PCR: NEGATIVE
Staphylococcus aureus: NEGATIVE

## 2012-10-15 SURGERY — LAPAROSCOPIC CHOLECYSTECTOMY WITH INTRAOPERATIVE CHOLANGIOGRAM
Anesthesia: General | Wound class: Contaminated

## 2012-10-15 MED ORDER — HYDROCODONE-ACETAMINOPHEN 5-325 MG PO TABS
1.0000 | ORAL_TABLET | ORAL | Status: DC | PRN
  Administered 2012-10-15 – 2012-10-16 (×3): 2 via ORAL
  Filled 2012-10-15 (×3): qty 2

## 2012-10-15 MED ORDER — MEPERIDINE HCL 50 MG/ML IJ SOLN
6.2500 mg | INTRAMUSCULAR | Status: DC | PRN
  Administered 2012-10-15: 12.5 mg via INTRAVENOUS

## 2012-10-15 MED ORDER — CIPROFLOXACIN IN D5W 400 MG/200ML IV SOLN
400.0000 mg | Freq: Once | INTRAVENOUS | Status: AC
  Administered 2012-10-15: 400 mg via INTRAVENOUS

## 2012-10-15 MED ORDER — MIDAZOLAM HCL 5 MG/5ML IJ SOLN
INTRAMUSCULAR | Status: DC | PRN
  Administered 2012-10-15: 2 mg via INTRAVENOUS

## 2012-10-15 MED ORDER — LACTATED RINGERS IV SOLN
INTRAVENOUS | Status: DC
  Administered 2012-10-15: 1000 mL via INTRAVENOUS
  Administered 2012-10-15: 13:00:00 via INTRAVENOUS

## 2012-10-15 MED ORDER — HEPARIN SODIUM (PORCINE) 5000 UNIT/ML IJ SOLN
5000.0000 [IU] | Freq: Three times a day (TID) | INTRAMUSCULAR | Status: DC
  Administered 2012-10-16: 5000 [IU] via SUBCUTANEOUS
  Filled 2012-10-15 (×4): qty 1

## 2012-10-15 MED ORDER — BUPIVACAINE HCL (PF) 0.25 % IJ SOLN
INTRAMUSCULAR | Status: DC | PRN
  Administered 2012-10-15: 6 mL

## 2012-10-15 MED ORDER — FENTANYL CITRATE 0.05 MG/ML IJ SOLN
INTRAMUSCULAR | Status: AC
  Filled 2012-10-15: qty 2

## 2012-10-15 MED ORDER — DEXAMETHASONE SODIUM PHOSPHATE 10 MG/ML IJ SOLN
INTRAMUSCULAR | Status: DC | PRN
  Administered 2012-10-15: 10 mg via INTRAVENOUS

## 2012-10-15 MED ORDER — FENTANYL CITRATE 0.05 MG/ML IJ SOLN
INTRAMUSCULAR | Status: DC | PRN
  Administered 2012-10-15 (×3): 50 ug via INTRAVENOUS
  Administered 2012-10-15: 150 ug via INTRAVENOUS
  Administered 2012-10-15: 50 ug via INTRAVENOUS
  Administered 2012-10-15: 100 ug via INTRAVENOUS

## 2012-10-15 MED ORDER — FENTANYL CITRATE 0.05 MG/ML IJ SOLN
25.0000 ug | INTRAMUSCULAR | Status: DC | PRN
  Administered 2012-10-15: 25 ug via INTRAVENOUS

## 2012-10-15 MED ORDER — ROCURONIUM BROMIDE 100 MG/10ML IV SOLN
INTRAVENOUS | Status: DC | PRN
  Administered 2012-10-15: 50 mg via INTRAVENOUS

## 2012-10-15 MED ORDER — SUCCINYLCHOLINE CHLORIDE 20 MG/ML IJ SOLN
INTRAMUSCULAR | Status: DC | PRN
  Administered 2012-10-15: 100 mg via INTRAVENOUS

## 2012-10-15 MED ORDER — IOHEXOL 300 MG/ML  SOLN
INTRAMUSCULAR | Status: DC | PRN
  Administered 2012-10-15: 50 mL via INTRAVENOUS

## 2012-10-15 MED ORDER — PROMETHAZINE HCL 25 MG/ML IJ SOLN: 6.2500 mg | INTRAMUSCULAR | Status: DC | PRN

## 2012-10-15 MED ORDER — SODIUM CHLORIDE 0.9 % IJ SOLN
INTRAMUSCULAR | Status: DC | PRN
  Administered 2012-10-15: 50 mL

## 2012-10-15 MED ORDER — NEOSTIGMINE METHYLSULFATE 1 MG/ML IJ SOLN
INTRAMUSCULAR | Status: DC | PRN
  Administered 2012-10-15: 4 mg via INTRAVENOUS

## 2012-10-15 MED ORDER — PROPOFOL 10 MG/ML IV BOLUS
INTRAVENOUS | Status: DC | PRN
  Administered 2012-10-15: 150 mg via INTRAVENOUS

## 2012-10-15 MED ORDER — LACTATED RINGERS IV SOLN: INTRAVENOUS | Status: DC

## 2012-10-15 MED ORDER — GLYCOPYRROLATE 0.2 MG/ML IJ SOLN
INTRAMUSCULAR | Status: DC | PRN
  Administered 2012-10-15: 0.6 mg via INTRAVENOUS

## 2012-10-15 MED ORDER — ONDANSETRON HCL 4 MG/2ML IJ SOLN
INTRAMUSCULAR | Status: DC | PRN
  Administered 2012-10-15: 4 mg via INTRAVENOUS

## 2012-10-15 MED ORDER — LACTATED RINGERS IR SOLN
Status: DC | PRN
  Administered 2012-10-15: 1000 mL

## 2012-10-15 SURGICAL SUPPLY — 40 items
APPLIER CLIP ROT 10 11.4 M/L (STAPLE) ×2
BENZOIN TINCTURE PRP APPL 2/3 (GAUZE/BANDAGES/DRESSINGS) ×2 IMPLANT
CANISTER SUCTION 2500CC (MISCELLANEOUS) ×2 IMPLANT
CHLORAPREP W/TINT 26ML (MISCELLANEOUS) ×2 IMPLANT
CHOLANGIOGRAM CATH TAUT (CATHETERS) ×2 IMPLANT
CLIP APPLIE ROT 10 11.4 M/L (STAPLE) ×1 IMPLANT
CLOTH BEACON ORANGE TIMEOUT ST (SAFETY) ×2 IMPLANT
COVER MAYO STAND STRL (DRAPES) IMPLANT
DECANTER SPIKE VIAL GLASS SM (MISCELLANEOUS) ×2 IMPLANT
DERMABOND ADVANCED (GAUZE/BANDAGES/DRESSINGS) ×1
DERMABOND ADVANCED .7 DNX12 (GAUZE/BANDAGES/DRESSINGS) ×1 IMPLANT
DRAPE C-ARM 42X72 X-RAY (DRAPES) IMPLANT
DRAPE LAPAROSCOPIC ABDOMINAL (DRAPES) ×2 IMPLANT
ELECT REM PT RETURN 9FT ADLT (ELECTROSURGICAL) ×2
ELECTRODE REM PT RTRN 9FT ADLT (ELECTROSURGICAL) ×1 IMPLANT
GLOVE BIOGEL PI IND STRL 7.0 (GLOVE) ×1 IMPLANT
GLOVE BIOGEL PI INDICATOR 7.0 (GLOVE) ×1
GLOVE SURG SIGNA 7.5 PF LTX (GLOVE) ×2 IMPLANT
GOWN STRL NON-REIN LRG LVL3 (GOWN DISPOSABLE) ×2 IMPLANT
GOWN STRL REIN XL XLG (GOWN DISPOSABLE) ×4 IMPLANT
HEMOSTAT SURGICEL 4X8 (HEMOSTASIS) IMPLANT
IV CATH 14GX2 1/4 (CATHETERS) ×2 IMPLANT
IV SET EXT 30 76VOL 4 MALE LL (IV SETS) ×2 IMPLANT
KIT BASIN OR (CUSTOM PROCEDURE TRAY) ×2 IMPLANT
NS IRRIG 1000ML POUR BTL (IV SOLUTION) IMPLANT
POUCH SPECIMEN RETRIEVAL 10MM (ENDOMECHANICALS) IMPLANT
SET IRRIG TUBING LAPAROSCOPIC (IRRIGATION / IRRIGATOR) ×2 IMPLANT
SLEEVE Z-THREAD 5X100MM (TROCAR) IMPLANT
SOLUTION ANTI FOG 6CC (MISCELLANEOUS) ×2 IMPLANT
STOPCOCK K 69 2C6206 (IV SETS) ×2 IMPLANT
STRIP CLOSURE SKIN 1/4X4 (GAUZE/BANDAGES/DRESSINGS) ×2 IMPLANT
SUT VIC AB 5-0 PS2 18 (SUTURE) ×2 IMPLANT
TOWEL OR 17X26 10 PK STRL BLUE (TOWEL DISPOSABLE) ×6 IMPLANT
TRAY LAP CHOLE (CUSTOM PROCEDURE TRAY) ×2 IMPLANT
TROCAR XCEL BLUNT TIP 100MML (ENDOMECHANICALS) ×2 IMPLANT
TROCAR Z-THREAD FIOS 11X100 BL (TROCAR) ×2 IMPLANT
TROCAR Z-THREAD FIOS 5X100MM (TROCAR) ×6 IMPLANT
TROCAR Z-THREAD SLEEVE 11X100 (TROCAR) IMPLANT
TUBING INSUFFLATION 10FT LAP (TUBING) ×2 IMPLANT
WATER STERILE IRR 1500ML POUR (IV SOLUTION) ×2 IMPLANT

## 2012-10-15 NOTE — Progress Notes (Signed)
Patient ambulated to bathroom and washed up with family assistance.  Walked well in room, returned to bed.

## 2012-10-15 NOTE — Progress Notes (Signed)
Patient ID: Kirsten Long, female   DOB: 03-14-1986, 26 y.o.   MRN: [id number] Day of Surgery  Subjective: Pt feels better.  Pain is better.  Objective: Vital signs in last 24 hours: Temp:  [97.5 F (36.4 C)-98.4 F (36.9 C)] 98.4 F (36.9 C) (12/27 0518) Pulse Rate:  [51-58] 58  (12/27 0518) Resp:  [16-18] 18  (12/27 0518) BP: (121-145)/(81-91) 121/83 mmHg (12/27 0518) SpO2:  [97 %-99 %] 99 % (12/27 0518) Weight:  [150 lb 12.7 oz (68.4 kg)] 150 lb 12.7 oz (68.4 kg) (12/26 2054)    Intake/Output from previous day: 12/26 0701 - 12/27 0700 In: 1420 [P.O.:720; I.V.:700] Out: 800 [Urine:800] Intake/Output this shift:    PE: Abd: soft, minimal RUQ tenderness, ND  Lab Results:   Basename 10/14/12 2205 10/14/12 1040  WBC 5.5 6.6  HGB 12.3 12.5  HCT 36.4 38.4  PLT 368 347   BMET  Basename 10/14/12 2205 10/14/12 1040  NA -- 142  K -- 3.7  CL -- 108  CO2 -- 28  GLUCOSE -- 90  BUN -- 9  CREATININE 0.74 0.71  CALCIUM -- 9.2   PT/INR No results found for this basename: LABPROT:2,INR:2 in the last 72 hours CMP     Component Value Date/Time   NA 142 10/14/2012 1040   K 3.7 10/14/2012 1040   CL 108 10/14/2012 1040   CO2 28 10/14/2012 1040   GLUCOSE 90 10/14/2012 1040   BUN 9 10/14/2012 1040   CREATININE 0.74 10/14/2012 2205   CALCIUM 9.2 10/14/2012 1040   PROT 6.5 10/14/2012 1040   ALBUMIN 3.5 10/14/2012 1040   AST 90* 10/14/2012 1040   ALT 31 10/14/2012 1040   ALKPHOS 156* 10/14/2012 1040   BILITOT 0.7 10/14/2012 1040   GFRNONAA >90 10/14/2012 2205   GFRAA >90 10/14/2012 2205   Lipase     Component Value Date/Time   LIPASE 31 10/14/2012 1040       Studies/Results: US Abdomen Complete  10/14/2012  *RADIOLOGY REPORT*  Clinical Data:  Right upper quadrant pain.  4 weeks postpartum.  ABDOMINAL ULTRASOUND COMPLETE  Comparison:  Obstetric ultrasound November 2010  Findings:  Gallbladder:  There are multiple tiny gallstones layering dependently within the  gallbladder.  Gallbladder wall thickness measures up to 3.8 mm, mildly thickened.The sonographic Murphy's sign is positive this tube (the patient is very tender with direct palpation over the gallbladder during imaging)  Common Bile Duct:  Within normal limits in caliber. Measures 2.4 mm  Liver: No focal mass lesion identified.  Within normal limits in parenchymal echogenicity.  IVC:  Appears normal.  Pancreas:  No abnormality identified.  Spleen:  Within normal limits in size and echotexture.  Right kidney:  Normal in size and parenchymal echogenicity.  No evidence of mass or hydronephrosis.  Left kidney:  Normal in size and parenchymal echogenicity.  No evidence of mass or hydronephrosis.  Abdominal Aorta:  No aneurysm identified.  No ascites is identified.  The  IMPRESSION: Findings suspicious for early acute cholecystitis.  This study is made a call report.   Original Report Authenticated By: Britta Mccreedy, M.D.     Anti-infectives: Anti-infectives    None     Assessment/Plan  1. Cholecystitis 2. Postpartum   Plan: 1. Will plan for lap chole today.  Patient is understanding and agreeable.   LOS: 1 day    OSBORNE,KELLY E 10/15/2012, 9:47 AM Pager: [phone]  Patient has husband and mother in room.  She is ready  for surgery.  Her mother had a laparoscopic cholecystectomy and her aunt had an open cholecystectomy.  She is 3 weeks from her 3rd son - "Z3".  I discussed with the patient the indications and risks of gall bladder surgery.  The primary risks of gall bladder surgery include, but are not limited to, bleeding, infection, common bile duct injury, and open surgery.  There is also the risk that the patient may have continued symptoms after surgery.  However, the likelihood of improvement in symptoms and return to the patient's normal status is good. We discussed the typical post-operative recovery course. I tried to answer the patient's questions.  I gave the patient literature about gall  bladder surgery.  She is for surgery later today.  Ovidio Kin, MD, [facility] Surgery Pager: [phone] Office phone:  [phone]

## 2012-10-15 NOTE — Anesthesia Preprocedure Evaluation (Signed)

## 2012-10-15 NOTE — Op Note (Signed)
10/14/2012 - 10/15/2012  1:53 PM  PATIENT:  Kirsten Long, 26 y.o., female, MRN: [id number]  PREOP DIAGNOSIS:  Cholecystitis, cholelithiasis  POSTOP DIAGNOSIS:   Cholecystitis, cholelithiasis, 1.5 cm infraumbilical hernia.  PROCEDURE:   Procedure(s): LAPAROSCOPIC CHOLECYSTECTOMY WITH INTRAOPERATIVE CHOLANGIOGRAM  SURGEON:   Ovidio Kin, M.D.  ASSISTANT:   None  ANESTHESIA:   general  Randon Goldsmith - CRNA Clydene Pugh [place], CRNA - CRNA Phillips Grout, MD - Anesthesiologist   EBL:  Minimal  ml  BLOOD ADMINISTERED: none  DRAINS: none   LOCAL MEDICATIONS USED:   30 cc 1/4% marcaine  SPECIMEN:   Gall bladdder  COUNTS CORRECT:  YES  INDICATIONS FOR PROCEDURE:  Kirsten Long is a 26 y.o. (DOB: [birth year]) AA female whose primary care physician is Thora Lance, MD and comes for cholecystectomy.   The indications and risks of the gall bladder surgery were explained to the patient.  The risks include, but are not limited to, infection, bleeding, common bile duct injury and open surgery.  SURGERY:  The patient was taken to room #6 at [facility].  The abdomen was prepped with chloroprep.  The patient was given 2 gm Ancef at the beginning of the operation.   A time out was held and the surgical checklist run.   An infraumbilical incision was made into the abdominal cavity.  A 12 mm Hasson trocar was inserted into the abdominal cavity through the infraumbilical incision and secured with a 0 Vicryl suture.  She was noted to have a 1.5 cm infraumbilical hernia.  I used the hernia as my Hasson trocar site.  Three additional trocars were inserted: a 10 mm trocar in the sub-xiphoid location, a 5 mm trocar in the right mid subcostal area, and a 5 mm trocar in the right lateral subcostal area.   The abdomen was explored and the liver, stomach, and bowel that could be seen were unremarkable.  Her uterus was still large, consistent with her recent pregnancy.   The gall bladder was  identified, grasped, and rotated cephalad.  Disssection was carried down to the gall bladder/cystic duct junction and the cystic duct isolated.  A clip was placed on the gall bladder side of the cystic duct.   The cystic artery and cysti duct were fused and had to be separated to identify the cystic duct.  An intra-operative cholangiogram was shot.   The intra-operative cholangiogram was shot using a cut off Taut catheter placed through a 14 gauge angiocath in the RUQ.  The Taut catheter was inserted in the cut cystic duct and secured with an endoclip.  A cholangiogram was shot with 10 cc of 1/2 strength Omnipaque.  Using fluoroscopy, the cholangiogram showed the flow of contrast into the common bile duct, up the hepatic radicals, and into the duodenum.  There was no mass or obstruction.  This was a normal intra-operative cholangiogram.   The Taut catheter was removed.  The cystic duct was tripley endoclipped and the cystic artery was identified and clipped.  The gall bladder was bluntly and sharpley dissected from the gall bladder bed.   After the gall bladder was removed from the liver, the gall bladder bed and Triangle of [place] were inspected.  There was no bleeding or bile leak.  The gall bladder was placed in a endocatch bag and delivered through the umbilicus.  The abdomen was irrigated with 2,000 cc saline.   The trocars were then removed.  I infiltrated 30cc of 1/4% Marcaine into the  incisions.  The umbilical port/umbilical hernia was closed with a 0 Vicryl suture x 3.  I visualized this repair within the abdomen.  The skin closed with 5-0 vicryl.  The skin was painted with Dermabond.  The patient's sponge and needle count were correct.  The patient was transported to the RR in good condition.   She was admitted last night and at this time, I will plan to keep her overnight with plans to go home tomorrow.  Ovidio Kin, MD, [facility] Surgery Pager: [phone] Office phone:   [phone]

## 2012-10-15 NOTE — Progress Notes (Signed)
Patient received from PACU drowsy but arousable, complaining of pain.  Abdomen soft but sore, four puncture sites clean dry and intact with invisible dressing present.  Patient assisted out of bed to bedside commode, voided without issue, returned to bed, SCD placed.  Family in the room with the patient.

## 2012-10-15 NOTE — Transfer of Care (Signed)
Immediate Anesthesia Transfer of Care Note  Patient: Kirsten Long  Procedure(s) Performed: Procedure(s) (LRB) with comments: LAPAROSCOPIC CHOLECYSTECTOMY WITH INTRAOPERATIVE CHOLANGIOGRAM (N/A)  Patient Location: PACU  Anesthesia Type:General  Level of Consciousness: awake and alert   Airway & Oxygen Therapy: Patient Spontanous Breathing and Patient connected to face mask oxygen  Post-op Assessment: Report given to PACU RN and Post -op Vital signs reviewed and stable  Post vital signs: Reviewed and stable  Complications: No apparent anesthesia complications

## 2012-10-15 NOTE — Plan of Care (Signed)
Problem: Phase II Progression Outcomes Goal: Progress activity as tolerated unless otherwise ordered Outcome: Progressing Ambulated to bathroom Goal: Progressing with IS, TCDB Outcome: Progressing Has incentive spirometry at bedside, encouraged using with explanation as to why this was important Goal: Dressings dry/intact Outcome: Completed/Met Date Met:  10/15/12 Has liquid dressing at four puncture sites, all clean dry and intact

## 2012-10-15 NOTE — Progress Notes (Signed)
Notified patient and her mother regarding surgery calling stating will pick up approximately 1030-11am.  Discussed the need for a CHG bath preop, pumping breast postop, family visitation prior to surgery, and the process when leaves the floor for surgery.

## 2012-10-16 MED ORDER — HYDROCODONE-ACETAMINOPHEN 5-325 MG PO TABS: 1.0000 | ORAL_TABLET | Freq: Four times a day (QID) | ORAL | Status: DC | PRN

## 2012-10-16 NOTE — Discharge Summary (Signed)
Physician Discharge Summary  Patient ID:  Kirsten Long  MRN: [id number]  DOB/AGE: 15-May-1986 26 y.o.  Admit date: 10/14/2012 Discharge date: 10/16/2012  Discharge Diagnoses:  1.  Active Problems: Cholecystitis, acute 2.  3 weeks post partum 3.  History of HSV  Operation: Procedure(s): LAPAROSCOPIC CHOLECYSTECTOMY WITH INTRAOPERATIVE CHOLANGIOGRAM on 10/14/2012 - 10/15/2012  Discharged Condition: good  Hospital Course: Kirsten Long is an 26 y.o. female whose primary care physician is Thora Lance, MD and who was admitted 10/14/2012 with a chief complaint of cholecystitis. She was brought to the operating room on 10/15/2012 and underwent  Lap chole with IOC.   She is now one day post op.  She is still sore but doing well and she is ready to go home.  Consults: None  Significant Diagnostic Studies: Results for orders placed during the hospital encounter of 10/14/12  CBC      Component Value Range   WBC 5.5  4.0 - 10.5 K/uL   RBC 3.99  3.87 - 5.11 MIL/uL   Hemoglobin 12.3  12.0 - 15.0 g/dL   HCT 02.7  25.3 - 66.4 %   MCV 91.2  78.0 - 100.0 fL   MCH 30.8  26.0 - 34.0 pg   MCHC 33.8  30.0 - 36.0 g/dL   RDW 40.3  47.4 - 25.9 %   Platelets 368  150 - 400 K/uL  CREATININE, SERUM      Component Value Range   Creatinine, Ser 0.74  0.50 - 1.10 mg/dL   GFR calc non Af Amer >90  >90 mL/min   GFR calc Af Amer >90  >90 mL/min  SURGICAL PCR SCREEN      Component Value Range   MRSA, PCR NEGATIVE  NEGATIVE   Staphylococcus aureus NEGATIVE  NEGATIVE    Dg Chest 2 View  10/08/2012  *RADIOLOGY REPORT*  Clinical Data: Right arm pain, 2 weeks postpartum  CHEST - 2 VIEW  Comparison: 11/24/2011  Findings: Lungs are clear. No pleural effusion or pneumothorax.  Cardiomediastinal silhouette is within normal limits.  Visualized osseous structures are within normal limits.  IMPRESSION: No evidence of acute cardiopulmonary disease.   Original Report Authenticated By: Charline Bills,  M.D.    Dg Cholangiogram Operative  10/15/2012  *RADIOLOGY REPORT*  Clinical Data:   Cholecystitis  INTRAOPERATIVE CHOLANGIOGRAM  Technique:  Cholangiographic images from the C-arm fluoroscopic device were submitted for interpretation post-operatively.  Please see the procedural report for the amount of contrast and the fluoroscopy time utilized.  Comparison:  None  Findings:  No persistent filling defects in the common duct. Intrahepatic ducts are incompletely visualized, appearing decompressed centrally. Contrast passes into the duodenum.  IMPRESSION  Negative for retained common duct stone.   Original Report Authenticated By: D. Andria Rhein, MD    US Abdomen Complete  10/14/2012  *RADIOLOGY REPORT*  Clinical Data:  Right upper quadrant pain.  4 weeks postpartum.  ABDOMINAL ULTRASOUND COMPLETE  Comparison:  Obstetric ultrasound November 2010  Findings:  Gallbladder:  There are multiple tiny gallstones layering dependently within the gallbladder.  Gallbladder wall thickness measures up to 3.8 mm, mildly thickened.The sonographic Murphy's sign is positive this tube (the patient is very tender with direct palpation over the gallbladder during imaging)  Common Bile Duct:  Within normal limits in caliber. Measures 2.4 mm  Liver: No focal mass lesion identified.  Within normal limits in parenchymal echogenicity.  IVC:  Appears normal.  Pancreas:  No abnormality identified.  Spleen:  Within  normal limits in size and echotexture.  Right kidney:  Normal in size and parenchymal echogenicity.  No evidence of mass or hydronephrosis.  Left kidney:  Normal in size and parenchymal echogenicity.  No evidence of mass or hydronephrosis.  Abdominal Aorta:  No aneurysm identified.  No ascites is identified.  The  IMPRESSION: Findings suspicious for early acute cholecystitis.  This study is made a call report.   Original Report Authenticated By: Britta Mccreedy, M.D.     Discharge Exam:  Filed Vitals:   10/16/12 0600  BP:  135/84  Pulse: 56  Temp: 99.3 F (37.4 C)  Resp: 18    General: WN AA F who is alert and generally healthy appearing.  Lungs: Clear to auscultation and symmetric breath sounds. Heart:  RRR. No murmur or rub. Abdomen: Soft. No mass. Incisions look good.  Discharge Medications:     Medication List     As of 10/16/2012 10:18 AM    TAKE these medications         HYDROcodone-acetaminophen 5-325 MG per tablet   Commonly known as: NORCO/VICODIN   Take 1-2 tablets by mouth every 6 (six) hours as needed for pain.      ibuprofen 600 MG tablet   Commonly known as: ADVIL,MOTRIN   Take 1 tablet (600 mg total) by mouth every 6 (six) hours as needed for pain.      prenatal multivitamin Tabs   Take 1 tablet by mouth every morning.      valACYclovir 500 MG tablet   Commonly known as: VALTREX   Take 500 mg by mouth 2 (two) times daily.      ASK your doctor about these medications         nitrofurantoin (macrocrystal-monohydrate) 100 MG capsule   Commonly known as: MACROBID   Take 100 mg by mouth 2 (two) times daily.        Disposition: 01-Home or Self Care      Discharge Orders    Future Orders Please Complete By Expires   Diet - low sodium heart healthy      Increase activity slowly        Return to work on: Already out on maternity leave.  Activity: Driving - May drive in 2 or 3 days, if doing well   Lifting - No lifting > 15 pounds for 7 days, then no limit  Wound Care: May shower starting tomorrow  Diet: As tolerated. Though it is probably wise to start with a low fat diet.  Follow up appointment: Call Dr. Allene Pyo office [facility]) at [phone] for an appointment in 2 to 3 weeks.  Medications and dosages:   Resume your home medications.   You have a prescription for: Vicodin  Signed: Ovidio Long, M.D., FACS  10/16/2012, 10:18 AM

## 2012-10-18 ENCOUNTER — Encounter (HOSPITAL_COMMUNITY): Payer: Self-pay | Admitting: Surgery

## 2012-10-18 MED FILL — Ciprofloxacin 400 MG/200ML in D5W: INTRAVENOUS | Qty: 200 | Status: AC

## 2012-10-18 NOTE — Anesthesia Postprocedure Evaluation (Signed)
  Anesthesia Post-op Note  Patient: Kirsten Long  Procedure(s) Performed: Procedure(s) (LRB): LAPAROSCOPIC CHOLECYSTECTOMY WITH INTRAOPERATIVE CHOLANGIOGRAM (N/A)  Patient Location: PACU  Anesthesia Type: General  Level of Consciousness: awake and alert   Airway and Oxygen Therapy: Patient Spontanous Breathing  Post-op Pain: mild  Post-op Assessment: Post-op Vital signs reviewed, Patient's Cardiovascular Status Stable, Respiratory Function Stable, Patent Airway and No signs of Nausea or vomiting  Last Vitals:  Filed Vitals:   10/16/12 0600  BP: 135/84  Pulse: 56  Temp: 37.4 C  Resp: 18    Post-op Vital Signs: stable   Complications: No apparent anesthesia complications

## 2012-10-22 ENCOUNTER — Telehealth (INDEPENDENT_AMBULATORY_CARE_PROVIDER_SITE_OTHER): Payer: Self-pay

## 2012-10-22 NOTE — Telephone Encounter (Signed)
P/O appt 10/26/12 @ 230 Voice mail to call to confirm

## 2012-10-26 ENCOUNTER — Ambulatory Visit (INDEPENDENT_AMBULATORY_CARE_PROVIDER_SITE_OTHER): Payer: Managed Care, Other (non HMO) | Admitting: Surgery

## 2012-10-26 ENCOUNTER — Encounter (INDEPENDENT_AMBULATORY_CARE_PROVIDER_SITE_OTHER): Payer: Self-pay | Admitting: Surgery

## 2012-10-26 VITALS — BP 128/82 | HR 60 | Temp 97.8°F | Resp 18 | Ht 63.0 in | Wt 138.0 lb

## 2012-10-26 DIAGNOSIS — K810 Acute cholecystitis: Secondary | ICD-10-CM

## 2012-10-26 NOTE — Progress Notes (Signed)
PO visit:  She had lap chole with IOC 10/14/2012.  She is out on maternity leave with her 3rd child.  She has done well from surgery. Her incision look good.  I gave her a copy of her path report.  Her return appt is PRN.  Ovidio Kin, MD, [facility] Pager: [phone] Office phone:  [phone]

## 2012-12-04 ENCOUNTER — Other Ambulatory Visit: Payer: Self-pay

## 2013-08-05 ENCOUNTER — Emergency Department (HOSPITAL_BASED_OUTPATIENT_CLINIC_OR_DEPARTMENT_OTHER): Payer: Managed Care, Other (non HMO)

## 2013-08-05 ENCOUNTER — Emergency Department (HOSPITAL_BASED_OUTPATIENT_CLINIC_OR_DEPARTMENT_OTHER)
Admission: EM | Admit: 2013-08-05 | Discharge: 2013-08-05 | Disposition: A | Discharge status: Home / Self Care | Payer: Managed Care, Other (non HMO) | Attending: Emergency Medicine | Admitting: Emergency Medicine

## 2013-08-05 ENCOUNTER — Encounter (HOSPITAL_BASED_OUTPATIENT_CLINIC_OR_DEPARTMENT_OTHER): Payer: Self-pay | Admitting: Emergency Medicine

## 2013-08-05 DIAGNOSIS — F419 Anxiety disorder, unspecified: Secondary | ICD-10-CM

## 2013-08-05 DIAGNOSIS — R0989 Other specified symptoms and signs involving the circulatory and respiratory systems: Secondary | ICD-10-CM | POA: Insufficient documentation

## 2013-08-05 DIAGNOSIS — F411 Generalized anxiety disorder: Secondary | ICD-10-CM | POA: Insufficient documentation

## 2013-08-05 DIAGNOSIS — R0602 Shortness of breath: Secondary | ICD-10-CM | POA: Insufficient documentation

## 2013-08-05 DIAGNOSIS — R0609 Other forms of dyspnea: Secondary | ICD-10-CM | POA: Insufficient documentation

## 2013-08-05 DIAGNOSIS — Z8619 Personal history of other infectious and parasitic diseases: Secondary | ICD-10-CM | POA: Insufficient documentation

## 2013-08-05 DIAGNOSIS — R0600 Dyspnea, unspecified: Secondary | ICD-10-CM

## 2013-08-05 DIAGNOSIS — R0789 Other chest pain: Secondary | ICD-10-CM | POA: Insufficient documentation

## 2013-08-05 DIAGNOSIS — Z87440 Personal history of urinary (tract) infections: Secondary | ICD-10-CM | POA: Insufficient documentation

## 2013-08-05 DIAGNOSIS — Z87891 Personal history of nicotine dependence: Secondary | ICD-10-CM | POA: Insufficient documentation

## 2013-08-05 MED ORDER — LORAZEPAM 1 MG PO TABS: 1.0000 mg | ORAL_TABLET | Freq: Three times a day (TID) | ORAL | Status: DC | PRN

## 2013-08-05 NOTE — ED Notes (Signed)
Anxiety/ Panic attack-  Pt reports she was at work, received a call from her mother and developed anxiety leading to a panic attack.  She reports situational stressors.

## 2013-08-05 NOTE — ED Provider Notes (Signed)
CSN: [id number]     Arrival date & time 08/05/13  1038 History   First MD Initiated Contact with Patient 08/05/13 1114     Chief Complaint  Patient presents with  . Panic Attack   (Consider location/radiation/quality/duration/timing/severity/associated sxs/prior Treatment) The history is provided by the patient.   patient is complaining of chest pain and shortness of breath. States she got a call from her mother and then began to feel worse. She states it hurt to breathe. She's been under a lot of stress recently. She states there was some pain with breathing. No cough. No swelling in her legs.  Past Medical History  Diagnosis Date  . Infection     Valtrex for HSV 2009   . HSV (herpes simplex virus) infection   . Urinary tract infection    Past Surgical History  Procedure Laterality Date  . Cholecystectomy  10/15/2012    Procedure: LAPAROSCOPIC CHOLECYSTECTOMY WITH INTRAOPERATIVE CHOLANGIOGRAM;  Surgeon: Kandis Cocking, MD;  Location: [facility];  Service: General;  Laterality: N/A;   Family History  Problem Relation Age of Onset  . Hypertension Mother   . Hypertension Maternal Aunt   . Hypertension Maternal Uncle   . Cancer Maternal Grandmother   . Hypertension Maternal Grandmother   . Hypertension Maternal Grandfather   . Hypertension Paternal Grandmother   . Stroke Paternal Grandmother   . Hypertension Paternal Grandfather   . Stroke Paternal Grandfather    History  Substance Use Topics  . Smoking status: Former Smoker -- 2 years    Quit date: 12/16/2011  . Smokeless tobacco: Never Used  . Alcohol Use: Yes     Comment: occassionally   OB History   Grav Para Term Preterm Abortions TAB SAB Ect Mult Living   4 3 3  0 1 0 1 0 0 3     Review of Systems  Constitutional: Negative for activity change and appetite change.  Eyes: Negative for pain.  Respiratory: Positive for chest tightness and shortness of breath.   Cardiovascular: Positive for chest pain. Negative for leg  swelling.  Gastrointestinal: Negative for nausea, vomiting, abdominal pain and diarrhea.  Genitourinary: Negative for flank pain.  Musculoskeletal: Negative for back pain and neck stiffness.  Skin: Negative for rash.  Neurological: Negative for weakness, numbness and headaches.  Psychiatric/Behavioral: Negative for behavioral problems. The patient is nervous/anxious.     Allergies  Review of patient's allergies indicates no known allergies.  Home Medications   Current Outpatient Rx  Name  Route  Sig  Dispense  Refill  . LORazepam (ATIVAN) 1 MG tablet   Oral   Take 1 tablet (1 mg total) by mouth every 8 (eight) hours as needed for anxiety.   8 tablet   0    BP 133/82  Pulse 97  Temp(Src) 98.5 F (36.9 C) (Oral)  Resp 28  Ht 5\' 3"  (1.6 m)  Wt 150 lb (68.04 kg)  BMI 26.58 kg/m2  SpO2 100%  LMP 07/22/2013  Breastfeeding? No Physical Exam  Nursing note and vitals reviewed. Constitutional: She is oriented to person, place, and time. She appears well-developed and well-nourished.  HENT:  Head: Normocephalic and atraumatic.  Eyes: EOM are normal. Pupils are equal, round, and reactive to light.  Neck: Normal range of motion. Neck supple.  Cardiovascular: Normal rate, regular rhythm and normal heart sounds.   No murmur heard. Pulmonary/Chest: Effort normal and breath sounds normal. No respiratory distress. She has no wheezes. She has no rales.  Abdominal:  Soft. Bowel sounds are normal. She exhibits no distension. There is no tenderness. There is no rebound and no guarding.  Musculoskeletal: Normal range of motion.  Neurological: She is alert and oriented to person, place, and time. No cranial nerve deficit.  Skin: Skin is warm and dry.  Psychiatric: She has a normal mood and affect. Her speech is normal.  Patient appears somewhat anxious    ED Course  Procedures (including critical care time) Labs Review Labs Reviewed - No data to display Imaging Review Dg Chest 2  View  08/05/2013   CLINICAL DATA:  Shortness of Breath and anxiety  EXAM: CHEST  2 VIEW  COMPARISON:  October 08, 2012  FINDINGS: The lungs are clear. Heart size and pulmonary vascularity are normal. No adenopathy. No bone lesions.  IMPRESSION: No abnormality noted.   Electronically Signed   By: Bretta Bang M.D.   On: 08/05/2013 12:25    EKG Interpretation     Ventricular Rate:  83 PR Interval:  204 QRS Duration: 90 QT Interval:  372 QTC Calculation: 437 R Axis:   41 Text Interpretation:  Normal sinus rhythm Normal ECG            MDM   1. Anxiety   2. Dyspnea    Patient with dyspnea and chest pain. Came on with anxiety. EKG reassuring. X-ray negative. Will discharge home with a few anxiolytics    Juliet Rude. Rubin Payor, MD 08/05/13 1335

## 2013-08-05 NOTE — ED Notes (Signed)
Patient transported to X-ray 

## 2013-08-25 ENCOUNTER — Other Ambulatory Visit: Payer: Self-pay

## 2013-11-03 ENCOUNTER — Emergency Department (HOSPITAL_COMMUNITY)
Admission: EM | Admit: 2013-11-03 | Discharge: 2013-11-03 | Disposition: A | Discharge status: Home / Self Care | Payer: Managed Care, Other (non HMO)

## 2013-11-04 ENCOUNTER — Emergency Department (HOSPITAL_COMMUNITY)
Admission: EM | Admit: 2013-11-04 | Discharge: 2013-11-04 | Disposition: A | Discharge status: Home / Self Care | Payer: Managed Care, Other (non HMO) | Attending: Emergency Medicine | Admitting: Emergency Medicine

## 2013-11-04 ENCOUNTER — Encounter (HOSPITAL_COMMUNITY): Payer: Self-pay | Admitting: Emergency Medicine

## 2013-11-04 DIAGNOSIS — H2000 Unspecified acute and subacute iridocyclitis: Secondary | ICD-10-CM | POA: Insufficient documentation

## 2013-11-04 DIAGNOSIS — Z8619 Personal history of other infectious and parasitic diseases: Secondary | ICD-10-CM | POA: Insufficient documentation

## 2013-11-04 DIAGNOSIS — Z87440 Personal history of urinary (tract) infections: Secondary | ICD-10-CM | POA: Insufficient documentation

## 2013-11-04 DIAGNOSIS — H53149 Visual discomfort, unspecified: Secondary | ICD-10-CM | POA: Insufficient documentation

## 2013-11-04 DIAGNOSIS — Z79899 Other long term (current) drug therapy: Secondary | ICD-10-CM | POA: Insufficient documentation

## 2013-11-04 DIAGNOSIS — Z87891 Personal history of nicotine dependence: Secondary | ICD-10-CM | POA: Insufficient documentation

## 2013-11-04 MED ORDER — FLUORESCEIN SODIUM 1 MG OP STRP
1.0000 | ORAL_STRIP | Freq: Once | OPHTHALMIC | Status: AC
  Administered 2013-11-04: 1 via OPHTHALMIC
  Filled 2013-11-04: qty 1

## 2013-11-04 MED ORDER — CIPROFLOXACIN HCL 0.3 % OP SOLN: 1.0000 [drp] | Freq: Four times a day (QID) | OPHTHALMIC | Status: DC

## 2013-11-04 MED ORDER — OXYCODONE-ACETAMINOPHEN 5-325 MG PO TABS: 2.0000 | ORAL_TABLET | ORAL | Status: DC | PRN

## 2013-11-04 MED ORDER — OXYCODONE-ACETAMINOPHEN 5-325 MG PO TABS
1.0000 | ORAL_TABLET | Freq: Once | ORAL | Status: AC
  Administered 2013-11-04: 1 via ORAL
  Filled 2013-11-04: qty 1

## 2013-11-04 MED ORDER — TETRACAINE HCL 0.5 % OP SOLN
1.0000 [drp] | Freq: Once | OPHTHALMIC | Status: AC
  Administered 2013-11-04: 1 [drp] via OPHTHALMIC
  Filled 2013-11-04: qty 2

## 2013-11-04 NOTE — ED Provider Notes (Signed)
Medical screening examination/treatment/procedure(s) were performed by non-physician practitioner and as supervising physician I was immediately available for consultation/collaboration.  EKG Interpretation   None         Ephraim Hamburger, MD 11/04/13 2254

## 2013-11-04 NOTE — ED Notes (Signed)
Pt reports she woke 1 week ago with "irritation" of right upper eyelid. Has gotten progressively worse. Now lids are very swollen and eye feels irritated. Positive for photophobia. Denies purulent drainage. States has been using eye wash. Does not where contact lens. No known injury.

## 2013-11-04 NOTE — ED Provider Notes (Signed)
CSN: [id number]     Arrival date & time 11/04/13  7673 History  This chart was scribed for non-physician practitioner, Clayton Bibles, PA-C, working with Ephraim Hamburger, MD by Roe Coombs, ED Scribe. This patient was seen in room TR04C/TR04C and the patient's care was started at 10:29 AM.    Chief Complaint  Patient presents with  . Eye Pain    The history is provided by the patient. No language interpreter was used.    HPI Comments: Kirsten Long is a 28 y.o. female who presents to the Emergency Department complaining of constant burning right eye pain specifically localized at her upper eyelid. She states that one week ago she woke up with irritation to her right eyelid that improved until last night when the pain returned. Pain is worse with exposure to light and palpation. She denies any obvious injury or trauma. There is associated swelling to the top and bottom eyelids of her right eye and photophobia that also began last night. She also reports associated blurred vision in the right eye. She has used an OTC eye wash without symptom relief, but has not taken any other medicines. She denies eye drainage.  She denies fever, cough, rhinorrhea, sinus pressure, congestion, nausea, vomiting. She has a family history of lupus in her aunt. She was tested for RA due to joint pain in the past, but results were negative. She does not wear contacts.  She has had no history of eye problems in the past.  Denies trauma to the eye or any possibility of foreign body.    Past Medical History  Diagnosis Date  . Infection     Valtrex for HSV 2009   . HSV (herpes simplex virus) infection   . Urinary tract infection    Past Surgical History  Procedure Laterality Date  . Cholecystectomy  10/15/2012    Procedure: LAPAROSCOPIC CHOLECYSTECTOMY WITH INTRAOPERATIVE CHOLANGIOGRAM;  Surgeon: Shann Medal, MD;  Location: [facility];  Service: General;  Laterality: N/A;   Family History  Problem Relation Age of Onset   . Hypertension Mother   . Hypertension Maternal Aunt   . Hypertension Maternal Uncle   . Cancer Maternal Grandmother   . Hypertension Maternal Grandmother   . Hypertension Maternal Grandfather   . Hypertension Paternal Grandmother   . Stroke Paternal Grandmother   . Hypertension Paternal Grandfather   . Stroke Paternal Grandfather    History  Substance Use Topics  . Smoking status: Former Smoker -- 2 years    Quit date: 12/16/2011  . Smokeless tobacco: Never Used  . Alcohol Use: Yes     Comment: occassionally   OB History   Grav Para Term Preterm Abortions TAB SAB Ect Mult Living   4 3 3  0 1 0 1 0 0 3     Review of Systems  HENT: Negative for congestion, rhinorrhea and sinus pressure.   Eyes: Positive for photophobia, pain and visual disturbance. Negative for discharge.  Respiratory: Negative for cough.   Gastrointestinal: Negative for nausea and vomiting.    Allergies  Review of patient's allergies indicates no known allergies.  Home Medications   Current Outpatient Rx  Name  Route  Sig  Dispense  Refill  . LORazepam (ATIVAN) 1 MG tablet   Oral   Take 1 tablet (1 mg total) by mouth every 8 (eight) hours as needed for anxiety.   8 tablet   0    Triage Vitals: BP 142/88  Pulse 95  Temp(Src)  98.3 F (36.8 C) (Oral) Physical Exam  Nursing note and vitals reviewed. Constitutional: She appears well-developed and well-nourished. No distress.  HENT:  Head: Normocephalic and atraumatic.  Eyes: EOM are normal. Pupils are equal, round, and reactive to light. Lids are everted and swept, no foreign bodies found. Right eye exhibits no discharge, no exudate and no hordeolum. No foreign body present in the right eye. Left eye exhibits no discharge. Right conjunctiva is injected. Right conjunctiva has no hemorrhage. Left conjunctiva is not injected. Left conjunctiva has no hemorrhage. No scleral icterus.  Slit lamp exam:      The right eye shows fluorescein uptake.  Edema  over the upper and lower lids of the right eye with no erythema. No masses. Right eye injected. EOM intact bilaterally. No discharge. No foreign bodies. +Photophobia and Consensual photophobia.Increased fluorescein uptake to right eye at 5:00 and 6:00 positions. Right eye pressure tested several times with readings between 19 and 23.  Neck: Neck supple.  Pulmonary/Chest: Effort normal.  Neurological: She is alert.  Skin: She is not diaphoretic.    ED Course  Procedures (including critical care time)  COORDINATION OF CARE: 10:34 AM- Patient informed of current plan for treatment and evaluation and agrees with plan at this time.     MDM   1. Acute iritis, right eye    Afebrile, nontoxic patient with mild right lid edema, injected sclera without history of trauma or recent illness. She does have increased fluorescein uptake over the lower aspect of her cornea but this does not seem to be the primary source of her discomfort. She has photophobia and consensual photophobia which is consistent with iritis. Her pressures of the right eye are normal. She does not clinically have periorbital or orbital cellulitis. Her extraocular movements are normal. Patient discharged home with Percocet and ciprofloxacin ophthalmic solution with close ophthalmology followup. Patient advised to call today to schedule a close followup appointment in the next day or 2.  Discussed findings, treatment, and follow up  with patient.  Pt given return precautions.  Pt verbalizes understanding and agrees with plan.      I doubt any other EMC precluding discharge at this time including, but not necessarily limited to the following: glaucoma, orbital or periorbital cellulitis   I personally performed the services described in this documentation, which was scribed in my presence. The recorded information has been reviewed and is accurate.   Clayton Bibles, PA-C 11/04/13 1218

## 2013-11-04 NOTE — Discharge Instructions (Signed)
Read the information below.  Use the prescribed medication as directed.  Please discuss all new medications with your pharmacist.  Do not take additional tylenol while taking the prescribed pain medication to avoid overdose.  You may return to the Emergency Department at any time for worsening condition or any new symptoms that concern you.  If you have uncontrolled pain, fevers, redness around your eye, change in vision, see the ophthalmologist or return to the ER immediately for a recheck.    Iritis Iritis is an inflammation of the colored part of the eye (iris). Other parts at the front of the eye may also be inflamed. The iris is part of the middle layer of the eyeball which is called the uvea or the uveal track. Any part of the uveal track can become inflamed. The other portions of the uveal track are the choroid (the thin membrane under the outer layer of the eye), and the ciliary body (joins the choroid and the iris and produces the fluid in the front of the eye).  It is extremely important to treat iritis early, as it may lead to internal eye damage causing scarring or diseases such as glaucoma. Some people have only one attack of iritis (in one or both eyes) in their lifetime, while others may get it many times. CAUSES Iritis can be associated with many different diseases, but mostly occurs in otherwise healthy people. Examples of diseases that can be associated with iritis include:  Diseases where the body's immune system attacks tissues within your own body (autoimmune diseases).  Infections (tuberculosis, gonorrhea, fungus infections, Lyme disease, infection of the lining of the heart).  Trauma or injury.  Eye diseases (acute glaucoma and others).  Inflammation from other parts of the uveal track.  Severe eye infections.  Other rare diseases. SYMPTOMS  Eye pain or aching.  Sensitivity to light.  Loss of sight or blurred vision.  Redness of the eye. This is often accompanied  by a ring of redness around the outside of the cornea, or clear covering at the front of the eye (ciliary flush).  Excessive tearing of the eye(s).  A small pupil that does not enlarge in the dark and stays smaller than the other eye's pupil.  A whitish area that obscures the lower part of the colored circular iris. Sometimes this is visible when looking at the eye, where the whitish area has a "fluid level" or flat top. This is called a "hypopyon" and is actually pus inside the eye. Since iritis causes the eye to become red, it is often confused with a much less dangerous form of "pink eye" or conjunctivitis. One of the most important symptoms is sensitivity to light. Anytime there is redness, discomfort in the eye(s) and extreme light sensitivity, it is extremely important to see an ophthalmologist as soon as possible. TREATMENT Acute iritis requires prompt medical evaluation by an eye specialist (ophthalmologist.) Treatment depends on the underlying cause but may include:  Corticosteroid eye drops and dilating eye drops. Follow your caregiver's exact instructions on taking and stopping corticosteroid medications (drops or pills).  Occasionally, the iritis will be so severe that it will not respond to commonly used medications. If this happens, it may be necessary to use steroid injections. The injections are given under the eye's outer surface. Sometimes oral medications are given. The decision on treatment used for iritis is usually made on an individual basis. HOME CARE INSTRUCTIONS Your care giver will give specific instructions regarding the use of  eye medications or other medications. Be certain to follow all instructions in both taking and stopping the medications. SEEK IMMEDIATE MEDICAL CARE IF:  You have redness of one or both eye.  You experience a great deal of light sensitivity.  You have pain or aching in either eye. MAKE SURE YOU:   Understand these instructions.  Will  watch your condition.  Will get help right away if you are not doing well or get worse. Document Released: 10/06/2005 Document Revised: 12/29/2011 Document Reviewed: 03/26/2007 [facility] Patient Information 2014 [place].

## 2013-12-10 ENCOUNTER — Encounter (HOSPITAL_COMMUNITY): Payer: Self-pay | Admitting: Emergency Medicine

## 2013-12-10 ENCOUNTER — Emergency Department (HOSPITAL_COMMUNITY)
Admission: EM | Admit: 2013-12-10 | Discharge: 2013-12-10 | Disposition: A | Discharge status: Home / Self Care | Payer: Managed Care, Other (non HMO) | Attending: Emergency Medicine | Admitting: Emergency Medicine

## 2013-12-10 DIAGNOSIS — Z8619 Personal history of other infectious and parasitic diseases: Secondary | ICD-10-CM | POA: Insufficient documentation

## 2013-12-10 DIAGNOSIS — Z87440 Personal history of urinary (tract) infections: Secondary | ICD-10-CM | POA: Insufficient documentation

## 2013-12-10 DIAGNOSIS — Z87891 Personal history of nicotine dependence: Secondary | ICD-10-CM | POA: Insufficient documentation

## 2013-12-10 DIAGNOSIS — H6690 Otitis media, unspecified, unspecified ear: Secondary | ICD-10-CM | POA: Insufficient documentation

## 2013-12-10 DIAGNOSIS — J029 Acute pharyngitis, unspecified: Secondary | ICD-10-CM | POA: Insufficient documentation

## 2013-12-10 MED ORDER — AMOXICILLIN 500 MG PO CAPS: 500.0000 mg | ORAL_CAPSULE | Freq: Three times a day (TID) | ORAL | Status: DC

## 2013-12-10 MED ORDER — ANTIPYRINE-BENZOCAINE 5.4-1.4 % OT SOLN: 3.0000 [drp] | OTIC | Status: DC | PRN

## 2013-12-10 NOTE — ED Notes (Signed)
Pt is here with right ear pain and tender behind ear.

## 2013-12-10 NOTE — ED Provider Notes (Signed)
CSN: [id number]     Arrival date & time 12/10/13  1749 History   This chart was scribed for non-physician practitioner, Rico Sheehan, working with Babette Relic, MD by Celesta Gentile, ED Scribe. This patient was seen in room TR05C/TR05C and the patient's care was started at 9:04 PM.  Chief Complaint  Patient presents with  . Otalgia   The history is provided by the patient. No language interpreter was used.   HPI Comments: Kirsten Long is a 28 y.o. female who presents to the Emergency Department complaining of constant worsening right otalgia that started about 4 days ago.  Pt complains of pain behind her ear and a sore throat.  She states her left ear doesn't hurt internally, but she has pain behind her ear.  Pt denies fever and chills.  Pt denies any known allergies to medications.   She hasn't tried taking anything for her symptoms.  Past Medical History  Diagnosis Date  . Infection     Valtrex for HSV 2009   . HSV (herpes simplex virus) infection   . Urinary tract infection    Past Surgical History  Procedure Laterality Date  . Cholecystectomy  10/15/2012    Procedure: LAPAROSCOPIC CHOLECYSTECTOMY WITH INTRAOPERATIVE CHOLANGIOGRAM;  Surgeon: Shann Medal, MD;  Location: [facility];  Service: General;  Laterality: N/A;   Family History  Problem Relation Age of Onset  . Hypertension Mother   . Hypertension Maternal Aunt   . Hypertension Maternal Uncle   . Cancer Maternal Grandmother   . Hypertension Maternal Grandmother   . Hypertension Maternal Grandfather   . Hypertension Paternal Grandmother   . Stroke Paternal Grandmother   . Hypertension Paternal Grandfather   . Stroke Paternal Grandfather    History  Substance Use Topics  . Smoking status: Former Smoker -- 2 years    Quit date: 12/16/2011  . Smokeless tobacco: Never Used  . Alcohol Use: Yes     Comment: occassionally   OB History   Grav Para Term Preterm Abortions TAB SAB Ect Mult Living   4 3 3  0 1 0 1 0 0 3      Review of Systems  Constitutional: Negative for fever and chills.  HENT: Positive for ear pain and sore throat.   Respiratory: Negative for cough.   Gastrointestinal: Negative for nausea and vomiting.  Neurological: Negative for headaches.  Psychiatric/Behavioral: Negative for behavioral problems and confusion.  All other systems reviewed and are negative.   Allergies  Review of patient's allergies indicates no known allergies.  Home Medications   Current Outpatient Rx  Name  Route  Sig  Dispense  Refill  . levonorgestrel (MIRENA) 20 MCG/24HR IUD   Intrauterine   1 each by Intrauterine route once.         . Pseudoephedrine-APAP-DM (DAYQUIL PO)   Oral   Take 30 mLs by mouth daily as needed (cold).         . valACYclovir (VALTREX) 500 MG tablet   Oral   Take 500 mg by mouth daily as needed (break out).         Marland Kitchen amoxicillin (AMOXIL) 500 MG capsule   Oral   Take 1 capsule (500 mg total) by mouth 3 (three) times daily.   30 capsule   0   . antipyrine-benzocaine (AURALGAN) otic solution   Both Ears   Place 3-4 drops into both ears every 2 (two) hours as needed for ear pain.   10 mL   0  Triage Vitals: BP 145/92  Pulse 88  Temp(Src) 98.2 F (36.8 C) (Oral)  Resp 18  SpO2 97%  LMP 11/21/2013  Physical Exam  Nursing note and vitals reviewed. Constitutional: She is oriented to person, place, and time. She appears well-developed and well-nourished. No distress.  HENT:  Head: Normocephalic and atraumatic.  Right Ear: External ear and ear canal normal. Tympanic membrane is bulging.  Left Ear: External ear and ear canal normal. Tympanic membrane is bulging.  Bilateral TM bulging with congestion.  Oropharynx is mildly erythematous.  No tonsillar exudate.  No abscess.  Airways intact.    Eyes: Conjunctivae and EOM are normal. Right eye exhibits no discharge. Left eye exhibits no discharge.  Neck: Trachea normal. Neck supple. No tracheal deviation present.   Positive for cervical adenopathy.    Cardiovascular: Normal rate.   Pulmonary/Chest: Effort normal. No respiratory distress.  Musculoskeletal: Normal range of motion.  Neurological: She is alert and oriented to person, place, and time.  Skin: Skin is warm and dry. No rash noted.  Psychiatric: She has a normal mood and affect. Her behavior is normal. Judgment and thought content normal.    ED Course  Procedures (including critical care time) DIAGNOSTIC STUDIES: Oxygen Saturation is 97% on RA, normal by my interpretation.    COORDINATION OF CARE: 9:10 PM-Will discharge with amoxicillin and auralgan.  Patient informed of current plan of treatment and evaluation and agrees with plan.    MDM   Final diagnoses:  Otitis media    I personally performed the services described in this documentation, which was scribed in my presence. The recorded information has been reviewed and is accurate.    Montine Circle, PA-C 12/10/13 2247

## 2013-12-10 NOTE — Discharge Instructions (Signed)
Otitis Media With Effusion Otitis media with effusion is the presence of fluid in the middle ear. This is a common problem in children, which often follows ear infections. It may be present for weeks or longer after the infection. Unlike an acute ear infection, otitis media with effusion refers only to fluid behind the ear drum and not infection. Children with repeated ear and sinus infections and allergy problems are the most likely to get otitis media with effusion. CAUSES  The most frequent cause of the fluid buildup is dysfunction of the eustachian tubes. These are the tubes that drain fluid in the ears to the to the back of the nose (nasopharynx). SYMPTOMS   The main symptom of this condition is hearing loss. As a result, you or your child may:  Listen to the TV at a loud volume.  Not respond to questions.  Ask "what" often when spoken to.  Mistake or confuse on sound or word for another.  There may be a sensation of fullness or pressure but usually not pain. DIAGNOSIS   Your health care provider will diagnose this condition by examining you or your child's ears.  Your health care provider may test the pressure in you or your child's ear with a tympanometer.  A hearing test may be conducted if the problem persists. TREATMENT   Treatment depends on the duration and the effects of the effusion.  Antibiotics, decongestants, nose drops, and cortisone-type drugs (tablets or nasal spray) may not be helpful.  Children with persistent ear effusions may have delayed language or behavioral problems. Children at risk for developmental delays in hearing, learning, and speech may require referral to a specialist earlier than children not at risk.  You or your child's health care provider may suggest a referral to an ear, nose, and throat surgeon for treatment. The following may help restore normal hearing:  Drainage of fluid.  Placement of ear tubes (tympanostomy tubes).  Removal of  adenoids (adenoidectomy). HOME CARE INSTRUCTIONS   Avoid second hand smoke.  Infants who are breast fed are less likely to have this condition.  Avoid feeding infants while laying flat.  Avoid known environmental allergens.  Avoid people who are sick. SEEK MEDICAL CARE IF:   Hearing is not better in 3 months.  Hearing is worse.  Ear pain.  Drainage from the ear.  Dizziness. MAKE SURE YOU:   Understand these instructions.  Will watch your condition.  Will get help right away if you are not doing well or get worse. Document Released: 11/13/2004 Document Revised: 07/27/2013 Document Reviewed: 05/03/2013 [employer] Patient Information 2014 [employer].  

## 2013-12-11 NOTE — ED Provider Notes (Signed)
Medical screening examination/treatment/procedure(s) were performed by non-physician practitioner and as supervising physician I was immediately available for consultation/collaboration.   Babette Relic, MD 12/11/13 [phone]

## 2013-12-26 ENCOUNTER — Other Ambulatory Visit: Payer: Self-pay | Admitting: Rheumatology

## 2013-12-26 DIAGNOSIS — M064 Inflammatory polyarthropathy: Secondary | ICD-10-CM

## 2014-01-01 ENCOUNTER — Ambulatory Visit
Admission: RE | Admit: 2014-01-01 | Discharge: 2014-01-01 | Disposition: A | Discharge status: Home / Self Care | Payer: Managed Care, Other (non HMO) | Source: Ambulatory Visit | Attending: Rheumatology | Admitting: Rheumatology

## 2014-01-01 DIAGNOSIS — M064 Inflammatory polyarthropathy: Secondary | ICD-10-CM

## 2014-01-01 MED ORDER — GADOBENATE DIMEGLUMINE 529 MG/ML IV SOLN
14.0000 mL | Freq: Once | INTRAVENOUS | Status: AC | PRN
  Administered 2014-01-01: 14 mL via INTRAVENOUS

## 2014-01-24 ENCOUNTER — Encounter (HOSPITAL_COMMUNITY): Payer: Self-pay | Admitting: Emergency Medicine

## 2014-01-24 ENCOUNTER — Emergency Department (HOSPITAL_COMMUNITY)
Admission: EM | Admit: 2014-01-24 | Discharge: 2014-01-24 | Disposition: A | Discharge status: Home / Self Care | Payer: Managed Care, Other (non HMO) | Attending: Emergency Medicine | Admitting: Emergency Medicine

## 2014-01-24 DIAGNOSIS — Z79899 Other long term (current) drug therapy: Secondary | ICD-10-CM | POA: Insufficient documentation

## 2014-01-24 DIAGNOSIS — Z8619 Personal history of other infectious and parasitic diseases: Secondary | ICD-10-CM | POA: Insufficient documentation

## 2014-01-24 DIAGNOSIS — IMO0002 Reserved for concepts with insufficient information to code with codable children: Secondary | ICD-10-CM | POA: Insufficient documentation

## 2014-01-24 DIAGNOSIS — Z87440 Personal history of urinary (tract) infections: Secondary | ICD-10-CM | POA: Insufficient documentation

## 2014-01-24 DIAGNOSIS — R197 Diarrhea, unspecified: Secondary | ICD-10-CM | POA: Insufficient documentation

## 2014-01-24 DIAGNOSIS — Z3202 Encounter for pregnancy test, result negative: Secondary | ICD-10-CM | POA: Insufficient documentation

## 2014-01-24 DIAGNOSIS — Z87891 Personal history of nicotine dependence: Secondary | ICD-10-CM | POA: Insufficient documentation

## 2014-01-24 DIAGNOSIS — R112 Nausea with vomiting, unspecified: Secondary | ICD-10-CM | POA: Insufficient documentation

## 2014-01-24 LAB — CBC WITH DIFFERENTIAL/PLATELET
BASOS ABS: 0 10*3/uL (ref 0.0–0.1)
Basophils Relative: 0 % (ref 0–1)
EOS ABS: 0.1 10*3/uL (ref 0.0–0.7)
EOS PCT: 1 % (ref 0–5)
HCT: 38.5 % (ref 36.0–46.0)
Hemoglobin: 13.4 g/dL (ref 12.0–15.0)
Lymphocytes Relative: 23 % (ref 12–46)
Lymphs Abs: 1.4 10*3/uL (ref 0.7–4.0)
MCH: 30.6 pg (ref 26.0–34.0)
MCHC: 34.8 g/dL (ref 30.0–36.0)
MCV: 87.9 fL (ref 78.0–100.0)
Monocytes Absolute: 0.5 10*3/uL (ref 0.1–1.0)
Monocytes Relative: 8 % (ref 3–12)
Neutro Abs: 4.2 10*3/uL (ref 1.7–7.7)
Neutrophils Relative %: 68 % (ref 43–77)
PLATELETS: 453 10*3/uL — AB (ref 150–400)
RBC: 4.38 MIL/uL (ref 3.87–5.11)
RDW: 13.7 % (ref 11.5–15.5)
WBC: 6.2 10*3/uL (ref 4.0–10.5)

## 2014-01-24 LAB — COMPREHENSIVE METABOLIC PANEL
ALT: 7 U/L (ref 0–35)
AST: 15 U/L (ref 0–37)
Albumin: 4.5 g/dL (ref 3.5–5.2)
Alkaline Phosphatase: 74 U/L (ref 39–117)
BUN: 13 mg/dL (ref 6–23)
CALCIUM: 9.1 mg/dL (ref 8.4–10.5)
CO2: 21 mEq/L (ref 19–32)
Chloride: 105 mEq/L (ref 96–112)
Creatinine, Ser: 0.8 mg/dL (ref 0.50–1.10)
GFR calc non Af Amer: >90 mL/min (ref >90)
Glucose, Bld: 81 mg/dL (ref 70–99)
POTASSIUM: 3.5 meq/L — AB (ref 3.7–5.3)
Sodium: 141 mEq/L (ref 137–147)
TOTAL PROTEIN: 8.4 g/dL — AB (ref 6.0–8.3)
Total Bilirubin: 0.6 mg/dL (ref 0.3–1.2)

## 2014-01-24 LAB — URINALYSIS, ROUTINE W REFLEX MICROSCOPIC
Glucose, UA: NEGATIVE mg/dL
Ketones, ur: 15 mg/dL — AB
NITRITE: NEGATIVE
PH: 5.5 (ref 5.0–8.0)
Protein, ur: 30 mg/dL — AB
SPECIFIC GRAVITY, URINE: 1.036 — AB (ref 1.005–1.030)
UROBILINOGEN UA: 0.2 mg/dL (ref 0.0–1.0)

## 2014-01-24 LAB — URINE MICROSCOPIC-ADD ON

## 2014-01-24 LAB — POC URINE PREG, ED: Preg Test, Ur: NEGATIVE

## 2014-01-24 LAB — CLOSTRIDIUM DIFFICILE BY PCR: CDIFFPCR: NEGATIVE

## 2014-01-24 LAB — LIPASE, BLOOD: LIPASE: 23 U/L (ref 11–59)

## 2014-01-24 MED ORDER — METRONIDAZOLE 500 MG PO TABS: 500.0000 mg | ORAL_TABLET | Freq: Two times a day (BID) | ORAL | Status: DC

## 2014-01-24 MED ORDER — ONDANSETRON HCL 4 MG PO TABS: 4.0000 mg | ORAL_TABLET | Freq: Four times a day (QID) | ORAL | Status: DC

## 2014-01-24 NOTE — ED Notes (Signed)
Per pt sts she has been having N,V,D since the 2nd of April. sts watery diarrhea and unable to hold down fluids.

## 2014-01-24 NOTE — ED Notes (Addendum)
I gave patient some ginger ale to ascertain if she is able to keep liquids down.  I also advised her we needed a stool sample.

## 2014-01-24 NOTE — ED Provider Notes (Signed)
CSN: [id number]     Arrival date & time 01/24/14  1144 History   First MD Initiated Contact with Patient 01/24/14 1209     Chief Complaint  Patient presents with  . Emesis  . Diarrhea     (Consider location/radiation/quality/duration/timing/severity/associated sxs/prior Treatment) HPI Comments: 28 year old female presents to the emergency department complaining of nausea, vomiting and diarrhea x5 days. States she's had multiple episodes of nonbloody, watery diarrhea along with nonbloody emesis every time she tries to eat or drink. She is able to hold down a small amount of water. Denies abdominal pain, fever, chills. She did have one episode of chills at night 3 days ago, however this symptom did not return. Denies sick contacts, recent antibiotic use, recent travel out of the country. She is on the fifth day of her menstrual cycle.  Patient is a 28 y.o. female presenting with vomiting and diarrhea. The history is provided by the patient.  Emesis Associated symptoms: diarrhea   Diarrhea Associated symptoms: vomiting     Past Medical History  Diagnosis Date  . Infection     Valtrex for HSV 2009   . HSV (herpes simplex virus) infection   . Urinary tract infection    Past Surgical History  Procedure Laterality Date  . Cholecystectomy  10/15/2012    Procedure: LAPAROSCOPIC CHOLECYSTECTOMY WITH INTRAOPERATIVE CHOLANGIOGRAM;  Surgeon: Shann Medal, MD;  Location: [facility];  Service: General;  Laterality: N/A;   Family History  Problem Relation Age of Onset  . Hypertension Mother   . Hypertension Maternal Aunt   . Hypertension Maternal Uncle   . Cancer Maternal Grandmother   . Hypertension Maternal Grandmother   . Hypertension Maternal Grandfather   . Hypertension Paternal Grandmother   . Stroke Paternal Grandmother   . Hypertension Paternal Grandfather   . Stroke Paternal Grandfather    History  Substance Use Topics  . Smoking status: Former Smoker -- 2 years    Quit date:  12/16/2011  . Smokeless tobacco: Never Used  . Alcohol Use: Yes     Comment: occassionally   OB History   Grav Para Term Preterm Abortions TAB SAB Ect Mult Living   4 3 3  0 1 0 1 0 0 3     Review of Systems  Gastrointestinal: Positive for nausea, vomiting and diarrhea.  All other systems reviewed and are negative.      Allergies  Review of patient's allergies indicates no known allergies.  Home Medications   Current Outpatient Rx  Name  Route  Sig  Dispense  Refill  . naproxen sodium (ANAPROX) 220 MG tablet   Oral   Take 440 mg by mouth 2 (two) times daily as needed (pain).         . PrednisoLONE Sodium Phosphate (PREDNISOL OP)   Right Eye   Place 1 drop into the right eye 4 (four) times daily.         . valACYclovir (VALTREX) 500 MG tablet   Oral   Take 500 mg by mouth daily as needed (break out).         Marland Kitchen levonorgestrel (MIRENA) 20 MCG/24HR IUD   Intrauterine   1 each by Intrauterine route once.         . metroNIDAZOLE (FLAGYL) 500 MG tablet   Oral   Take 1 tablet (500 mg total) by mouth 2 (two) times daily.   28 tablet   0   . ondansetron (ZOFRAN) 4 MG tablet   Oral  Take 1 tablet (4 mg total) by mouth every 6 (six) hours.   12 tablet   0    BP 122/79  Pulse 94  Temp(Src) 99.2 F (37.3 C) (Oral)  Resp 18  Wt 158 lb (71.668 kg)  SpO2 98%  LMP 01/19/2014 Physical Exam  Nursing note and vitals reviewed. Constitutional: She is oriented to person, place, and time. She appears well-developed and well-nourished. No distress.  HENT:  Head: Normocephalic and atraumatic.  Mouth/Throat: Oropharynx is clear and moist and mucous membranes are normal. Mucous membranes are not dry.  Eyes: Conjunctivae are normal.  Neck: Normal range of motion. Neck supple.  Cardiovascular: Normal rate, regular rhythm and normal heart sounds.   Pulmonary/Chest: Effort normal and breath sounds normal.  Abdominal: Soft. Bowel sounds are normal. She exhibits no  distension and no mass. There is no tenderness. There is no rebound and no guarding.  Musculoskeletal: Normal range of motion. She exhibits no edema.  Neurological: She is alert and oriented to person, place, and time.  Skin: Skin is warm and dry. She is not diaphoretic.  Psychiatric: She has a normal mood and affect. Her behavior is normal.    ED Course  Procedures (including critical care time) Labs Review Labs Reviewed  CBC WITH DIFFERENTIAL - Abnormal; Notable for the following:    Platelets 453 (*)    All other components within normal limits  COMPREHENSIVE METABOLIC PANEL - Abnormal; Notable for the following:    Potassium 3.5 (*)    Total Protein 8.4 (*)    All other components within normal limits  STOOL CULTURE  CLOSTRIDIUM DIFFICILE BY PCR  LIPASE, BLOOD  URINALYSIS, ROUTINE W REFLEX MICROSCOPIC  POC URINE PREG, ED   Imaging Review No results found.   EKG Interpretation None      MDM   Final diagnoses:  Diarrhea  Nausea and vomiting   Pt with n/v/d, she is well appearing and in NAD, afebrile, normal VS. Abdomen soft and non-tender. Moist MM. No nausea in the ED. Requesting to drink water. Unsure if she can have a BM in the ED. Labs pending.    Pt had a loose, watery, yellow, very foul-smelling BM. States her father-in-law has been in the hospital for the past month and she has been visiting. Probable c-diff, sent for culture. Labs without any acute finding. Stable for discharge, will treat with 2 weeks of Flagyl. She will followup with her primary care physician in one week. Return precautions given. Patient states understanding of treatment care plan and is agreeable.   Illene Labrador, PA-C 01/24/14 [place], PA-C 01/24/14 1530

## 2014-01-24 NOTE — Discharge Instructions (Signed)
Take antibiotic twice daily for the next 14 days. Followup with your doctor in one week. Stay well-hydrated. Take Zofran as directed as needed for nausea.  Clostridium Difficile Infection Clostridium difficile (C. difficile) is a bacteria found in the intestinal tract or colon. Under certain conditions, it causes diarrhea and sometimes severe disease. The severe form of the disease is known as pseudomembranous colitis (often called C. difficile colitis). This disease can damage the lining of the colon or cause the colon to become enlarged (toxic megacolon).  CAUSES  Your colon normally contains many different bacteria, including C. difficile. The balance of bacteria in your colon can change during illness. This is especially true when you take antibiotic medicine. Taking antibiotics may allow the C. difficile to grow, multiply excessively, and make a toxin that then causes illness. The elderly and people with certain medical conditions have a greater risk of getting C. difficile infections. SYMPTOMS   Watery diarrhea.  Fever.  Fatigue.  Loss of appetite.  Nausea.  Abdominal swelling, pain, or tenderness.  Dehydration. DIAGNOSIS  Your symptoms may make your caregiver suspicious of a C. difficile infection, especially if you have used antibiotics in the preceding weeks. However, there are only 2 ways to know for certain whether you have a C. difficile infection:  A lab test that finds the toxin in your stool.  The specific appearance of an abnormality (pseudomembrane) in your colon. This can only be seen by doing a sigmoidoscopy or colonoscopy. These procedures involve passing an instrument through your rectum to look at the inside of your colon. Your caregiver will help determine if these tests are necessary. TREATMENT   Most people are successfully treated with one of two specific antibiotics, usually given by mouth. Other antibiotics you are receiving are stopped if  possible.  Intravenous (IV) fluids and correction of electrolyte imbalance may be necessary.  Rarely, surgery may be needed to remove the infected part of the intestines.  Careful hand washing by you and your caregivers is important to prevent the spread of infection. In the hospital, your caregivers may also put on gowns and gloves to prevent the spread of the C. difficile bacteria. Your room is also cleaned regularly with a solution containing bleach or a product that is known to kill C. difficile. HOME CARE INSTRUCTIONS  Drink enough fluids to keep your urine clear or pale yellow. Avoid milk, caffeine, and alcohol.  Ask your caregiver for specific rehydration instructions.  Try eating small, frequent meals rather than large meals.  Take your antibiotics as directed. Finish them even if you start to feel better.  Do not use medicines to slow diarrhea. This could delay healing or cause complications.  Wash your hands thoroughly after using the bathroom and before preparing food.  Make sure people who live with you wash their hands often, too.  Carefully disinfect all surfaces with a product that contains chlorine bleach. SEEK MEDICAL CARE IF:  Diarrhea persists longer than expected or recurs after completing your course of antibiotic treatment for the C. difficile infection.  You have trouble staying hydrated. SEEK IMMEDIATE MEDICAL CARE IF:  You develop a new fever.  You have increasing abdominal pain or tenderness.  There is blood in your stools, or your stools are dark black and tarry.  You cannot hold down food or liquids. MAKE SURE YOU:   Understand these instructions.  Will watch your condition.  Will get help right away if you are not doing well or get worse.  Document Released: 07/16/2005 Document Revised: 01/31/2013 Document Reviewed: 03/14/2011 [facility] Patient Information 2014 [place], [place].  Diarrhea Diarrhea is frequent loose and watery bowel movements.  It can cause you to feel weak and dehydrated. Dehydration can cause you to become tired and thirsty, have a dry mouth, and have decreased urination that often is dark yellow. Diarrhea is a sign of another problem, most often an infection that will not last long. In most cases, diarrhea typically lasts 2 3 days. However, it can last longer if it is a sign of something more serious. It is important to treat your diarrhea as directed by your caregive to lessen or prevent future episodes of diarrhea. CAUSES  Some common causes include:  Gastrointestinal infections caused by viruses, bacteria, or parasites.  Food poisoning or food allergies.  Certain medicines, such as antibiotics, chemotherapy, and laxatives.  Artificial sweeteners and fructose.  Digestive disorders. HOME CARE INSTRUCTIONS  Ensure adequate fluid intake (hydration): have 1 cup (8 oz) of fluid for each diarrhea episode. Avoid fluids that contain simple sugars or sports drinks, fruit juices, whole milk products, and sodas. Your urine should be clear or pale yellow if you are drinking enough fluids. Hydrate with an oral rehydration solution that you can purchase at pharmacies, retail stores, and online. You can prepare an oral rehydration solution at home by mixing the following ingredients together:    tsp table salt.   tsp baking soda.   tsp salt substitute containing potassium chloride.  1  tablespoons sugar.  1 L (34 oz) of water.  Certain foods and beverages may increase the speed at which food moves through the gastrointestinal (GI) tract. These foods and beverages should be avoided and include:  Caffeinated and alcoholic beverages.  High-fiber foods, such as raw fruits and vegetables, nuts, seeds, and whole grain breads and cereals.  Foods and beverages sweetened with sugar alcohols, such as xylitol, sorbitol, and mannitol.  Some foods may be well tolerated and may help thicken stool including:  Starchy foods,  such as rice, toast, pasta, low-sugar cereal, oatmeal, grits, baked potatoes, crackers, and bagels.  Bananas.  Applesauce.  Add probiotic-rich foods to help increase healthy bacteria in the GI tract, such as yogurt and fermented milk products.  Wash your hands well after each diarrhea episode.  Only take over-the-counter or prescription medicines as directed by your caregiver.  Take a warm bath to relieve any burning or pain from frequent diarrhea episodes. SEEK IMMEDIATE MEDICAL CARE IF:   You are unable to keep fluids down.  You have persistent vomiting.  You have blood in your stool, or your stools are black and tarry.  You do not urinate in 6 8 hours, or there is only a small amount of very dark urine.  You have abdominal pain that increases or localizes.  You have weakness, dizziness, confusion, or lightheadedness.  You have a severe headache.  Your diarrhea gets worse or does not get better.  You have a fever or persistent symptoms for more than 2 3 days.  You have a fever and your symptoms suddenly get worse. MAKE SURE YOU:   Understand these instructions.  Will watch your condition.  Will get help right away if you are not doing well or get worse. Document Released: 09/26/2002 Document Revised: 09/22/2012 Document Reviewed: 06/13/2012 [facility] Patient Information 2014 [place], [place].  Nausea and Vomiting Nausea is a sick feeling that often comes before throwing up (vomiting). Vomiting is a reflex where stomach contents come out of  your mouth. Vomiting can cause severe loss of body fluids (dehydration). Children and elderly adults can become dehydrated quickly, especially if they also have diarrhea. Nausea and vomiting are symptoms of a condition or disease. It is important to find the cause of your symptoms. CAUSES   Direct irritation of the stomach lining. This irritation can result from increased acid production (gastroesophageal reflux disease), infection,  food poisoning, taking certain medicines (such as nonsteroidal anti-inflammatory drugs), alcohol use, or tobacco use.  Signals from the brain.These signals could be caused by a headache, heat exposure, an inner ear disturbance, increased pressure in the brain from injury, infection, a tumor, or a concussion, pain, emotional stimulus, or metabolic problems.  An obstruction in the gastrointestinal tract (bowel obstruction).  Illnesses such as diabetes, hepatitis, gallbladder problems, appendicitis, kidney problems, cancer, sepsis, atypical symptoms of a heart attack, or eating disorders.  Medical treatments such as chemotherapy and radiation.  Receiving medicine that makes you sleep (general anesthetic) during surgery. DIAGNOSIS Your caregiver may ask for tests to be done if the problems do not improve after a few days. Tests may also be done if symptoms are severe or if the reason for the nausea and vomiting is not clear. Tests may include:  Urine tests.  Blood tests.  Stool tests.  Cultures (to look for evidence of infection).  X-rays or other imaging studies. Test results can help your caregiver make decisions about treatment or the need for additional tests. TREATMENT You need to stay well hydrated. Drink frequently but in small amounts.You may wish to drink water, sports drinks, clear broth, or eat frozen ice pops or gelatin dessert to help stay hydrated.When you eat, eating slowly may help prevent nausea.There are also some antinausea medicines that may help prevent nausea. HOME CARE INSTRUCTIONS   Take all medicine as directed by your caregiver.  If you do not have an appetite, do not force yourself to eat. However, you must continue to drink fluids.  If you have an appetite, eat a normal diet unless your caregiver tells you differently.  Eat a variety of complex carbohydrates (rice, wheat, potatoes, bread), lean meats, yogurt, fruits, and vegetables.  Avoid high-fat  foods because they are more difficult to digest.  Drink enough water and fluids to keep your urine clear or pale yellow.  If you are dehydrated, ask your caregiver for specific rehydration instructions. Signs of dehydration may include:  Severe thirst.  Dry lips and mouth.  Dizziness.  Dark urine.  Decreasing urine frequency and amount.  Confusion.  Rapid breathing or pulse. SEEK IMMEDIATE MEDICAL CARE IF:   You have blood or brown flecks (like coffee grounds) in your vomit.  You have black or bloody stools.  You have a severe headache or stiff neck.  You are confused.  You have severe abdominal pain.  You have chest pain or trouble breathing.  You do not urinate at least once every 8 hours.  You develop cold or clammy skin.  You continue to vomit for longer than 24 to 48 hours.  You have a fever. MAKE SURE YOU:   Understand these instructions.  Will watch your condition.  Will get help right away if you are not doing well or get worse. Document Released: 10/06/2005 Document Revised: 12/29/2011 Document Reviewed: 03/05/2011 [facility] Patient Information 2014 [place], [place].

## 2014-01-24 NOTE — ED Notes (Signed)
Patient is alert and orientedx4.  Patient was explained discharge instructions and they understood them with no questions.   

## 2014-01-24 NOTE — ED Provider Notes (Signed)
Medical screening examination/treatment/procedure(s) were performed by non-physician practitioner and as supervising physician I was immediately available for consultation/collaboration.   EKG Interpretation None        Leota Jacobsen, MD 01/24/14 1727

## 2014-01-28 LAB — STOOL CULTURE

## 2014-08-21 ENCOUNTER — Encounter (HOSPITAL_COMMUNITY): Payer: Self-pay | Admitting: Emergency Medicine

## 2015-03-18 ENCOUNTER — Emergency Department (HOSPITAL_BASED_OUTPATIENT_CLINIC_OR_DEPARTMENT_OTHER): Payer: BLUE CROSS/BLUE SHIELD

## 2015-03-18 ENCOUNTER — Encounter (HOSPITAL_BASED_OUTPATIENT_CLINIC_OR_DEPARTMENT_OTHER): Payer: Self-pay | Admitting: Emergency Medicine

## 2015-03-18 ENCOUNTER — Emergency Department (HOSPITAL_BASED_OUTPATIENT_CLINIC_OR_DEPARTMENT_OTHER)
Admission: EM | Admit: 2015-03-18 | Discharge: 2015-03-18 | Disposition: A | Discharge status: Home / Self Care | Payer: BLUE CROSS/BLUE SHIELD | Attending: Emergency Medicine | Admitting: Emergency Medicine

## 2015-03-18 DIAGNOSIS — Z3202 Encounter for pregnancy test, result negative: Secondary | ICD-10-CM | POA: Diagnosis not present

## 2015-03-18 DIAGNOSIS — Z79899 Other long term (current) drug therapy: Secondary | ICD-10-CM | POA: Insufficient documentation

## 2015-03-18 DIAGNOSIS — Z7952 Long term (current) use of systemic steroids: Secondary | ICD-10-CM | POA: Insufficient documentation

## 2015-03-18 DIAGNOSIS — Z8619 Personal history of other infectious and parasitic diseases: Secondary | ICD-10-CM | POA: Insufficient documentation

## 2015-03-18 DIAGNOSIS — R05 Cough: Secondary | ICD-10-CM | POA: Diagnosis present

## 2015-03-18 DIAGNOSIS — Z87891 Personal history of nicotine dependence: Secondary | ICD-10-CM | POA: Insufficient documentation

## 2015-03-18 DIAGNOSIS — R1110 Vomiting, unspecified: Secondary | ICD-10-CM | POA: Diagnosis not present

## 2015-03-18 DIAGNOSIS — J029 Acute pharyngitis, unspecified: Secondary | ICD-10-CM | POA: Diagnosis not present

## 2015-03-18 DIAGNOSIS — J40 Bronchitis, not specified as acute or chronic: Secondary | ICD-10-CM

## 2015-03-18 DIAGNOSIS — J209 Acute bronchitis, unspecified: Secondary | ICD-10-CM | POA: Insufficient documentation

## 2015-03-18 DIAGNOSIS — Z87440 Personal history of urinary (tract) infections: Secondary | ICD-10-CM | POA: Diagnosis not present

## 2015-03-18 LAB — URINALYSIS, ROUTINE W REFLEX MICROSCOPIC
Bilirubin Urine: NEGATIVE
Glucose, UA: NEGATIVE mg/dL
Hgb urine dipstick: NEGATIVE
KETONES UR: NEGATIVE mg/dL
Nitrite: NEGATIVE
Protein, ur: NEGATIVE mg/dL
SPECIFIC GRAVITY, URINE: 1.019 (ref 1.005–1.030)
Urobilinogen, UA: 1 mg/dL (ref 0.0–1.0)
pH: 7.5 (ref 5.0–8.0)

## 2015-03-18 LAB — URINE MICROSCOPIC-ADD ON

## 2015-03-18 LAB — PREGNANCY, URINE: Preg Test, Ur: NEGATIVE

## 2015-03-18 LAB — RAPID STREP SCREEN (MED CTR MEBANE ONLY): Streptococcus, Group A Screen (Direct): NEGATIVE

## 2015-03-18 MED ORDER — HYDROCOD POLST-CPM POLST ER 10-8 MG/5ML PO SUER: 5.0000 mL | Freq: Two times a day (BID) | ORAL | Status: DC | PRN

## 2015-03-18 MED ORDER — AZITHROMYCIN 250 MG PO TABS: ORAL_TABLET | ORAL | Status: DC

## 2015-03-18 NOTE — Discharge Instructions (Signed)

## 2015-03-18 NOTE — ED Notes (Signed)
Pt in c/o sore throat and cough x 1 week, has been taking OTC meds but is feeling worse.

## 2015-03-18 NOTE — ED Provider Notes (Signed)
CSN: [id number]     Arrival date & time 03/18/15  1814 History  This chart was scribed for Evelina Bucy, MD by Stephania Fragmin, ED Scribe. This patient was seen in room MH12/MH12 and the patient's care was started at 7:09 PM.    Chief Complaint  Patient presents with  . Sore Throat  . Cough   Patient is a 29 y.o. female presenting with pharyngitis. The history is provided by the patient. No language interpreter was used.  Sore Throat This is a new problem. The current episode started more than 2 days ago. The problem occurs constantly. The problem has not changed since onset.Associated symptoms include chest pain. Pertinent negatives include no abdominal pain, no headaches and no shortness of breath. Nothing aggravates the symptoms. Nothing relieves the symptoms. Treatments tried: OTC medication.     HPI Comments: Kirsten Long is a 29 y.o. female who presents to the Emergency Department complaining of cough, sore throat, and chest pain secondary to coughing that has been ongoing for 1 week. She endorses post-tussive vomiting. Patient has tried OTC meds with no relief. Nothing makes it better and nothing makes it worse. She has had sick contact with her son, who has an ear infection. She denies a history of asthma. She denies fever, ear pain, rash, or trouble swallowing. She denies a history of smoking.   Past Medical History  Diagnosis Date  . Infection     Valtrex for HSV 2009   . HSV (herpes simplex virus) infection   . Urinary tract infection    Past Surgical History  Procedure Laterality Date  . Cholecystectomy  10/15/2012    Procedure: LAPAROSCOPIC CHOLECYSTECTOMY WITH INTRAOPERATIVE CHOLANGIOGRAM;  Surgeon: Shann Medal, MD;  Location: [facility];  Service: General;  Laterality: N/A;   Family History  Problem Relation Age of Onset  . Hypertension Mother   . Hypertension Maternal Aunt   . Hypertension Maternal Uncle   . Cancer Maternal Grandmother   . Hypertension Maternal  Grandmother   . Hypertension Maternal Grandfather   . Hypertension Paternal Grandmother   . Stroke Paternal Grandmother   . Hypertension Paternal Grandfather   . Stroke Paternal Grandfather    History  Substance Use Topics  . Smoking status: Former Smoker -- 2 years    Quit date: 12/16/2011  . Smokeless tobacco: Never Used  . Alcohol Use: Yes     Comment: occassionally   OB History    Gravida Para Term Preterm AB TAB SAB Ectopic Multiple Living   4 3 3  0 1 0 1 0 0 3     Review of Systems  Constitutional: Negative for fever.  HENT: Positive for sore throat. Negative for ear pain and trouble swallowing.   Respiratory: Positive for cough. Negative for shortness of breath.   Cardiovascular: Positive for chest pain.  Gastrointestinal: Positive for vomiting (post-tussive). Negative for abdominal pain.  Skin: Negative for rash.  Neurological: Negative for headaches.  All other systems reviewed and are negative.     Allergies  Review of patient's allergies indicates no known allergies.  Home Medications   Prior to Admission medications   Medication Sig Start Date End Date Taking? Authorizing Provider  levonorgestrel (MIRENA) 20 MCG/24HR IUD 1 each by Intrauterine route once.    Historical Provider, MD  metroNIDAZOLE (FLAGYL) 500 MG tablet Take 1 tablet (500 mg total) by mouth 2 (two) times daily. 01/24/14   Carman Ching, PA-C  naproxen sodium (ANAPROX) 220 MG tablet Take  440 mg by mouth 2 (two) times daily as needed (pain).    Historical Provider, MD  ondansetron (ZOFRAN) 4 MG tablet Take 1 tablet (4 mg total) by mouth every 6 (six) hours. 01/24/14   Carman Ching, PA-C  PrednisoLONE Sodium Phosphate (PREDNISOL OP) Place 1 drop into the right eye 4 (four) times daily.    Historical Provider, MD  valACYclovir (VALTREX) 500 MG tablet Take 500 mg by mouth daily as needed (break out).    Historical Provider, MD   BP 140/95 mmHg  Pulse 75  Temp(Src) 97.8 F (36.6 C) (Oral)  Resp 18   Ht 5\' 3"  (1.6 m)  Wt 155 lb (70.308 kg)  BMI 27.46 kg/m2  SpO2 100%  LMP 01/03/2015 Physical Exam  Constitutional: She is oriented to person, place, and time. She appears well-developed and well-nourished. No distress.  HENT:  Head: Normocephalic and atraumatic.  Mouth/Throat: Oropharynx is clear and moist.  Eyes: EOM are normal. Pupils are equal, round, and reactive to light.  Neck: Normal range of motion. Neck supple.  Cardiovascular: Normal rate and regular rhythm.  Exam reveals no friction rub.   No murmur heard. Pulmonary/Chest: Effort normal and breath sounds normal. No respiratory distress. She has no wheezes. She has no rales.  Abdominal: Soft. She exhibits no distension. There is no tenderness. There is no rebound.  Musculoskeletal: Normal range of motion. She exhibits no edema.  Neurological: She is alert and oriented to person, place, and time.  Skin: She is not diaphoretic.  Nursing note and vitals reviewed.   ED Course  Procedures (including critical care time)  DIAGNOSTIC STUDIES: Oxygen Saturation is 100% on RA, normal by my interpretation.    COORDINATION OF CARE: 7:13 PM - Discussed treatment plan with pt at bedside which includes antibiotics, and pt agreed to plan.   Labs Review Labs Reviewed  URINALYSIS, ROUTINE W REFLEX MICROSCOPIC (NOT AT [facility]) - Abnormal; Notable for the following:    APPearance CLOUDY (*)    Leukocytes, UA MODERATE (*)    All other components within normal limits  URINE MICROSCOPIC-ADD ON - Abnormal; Notable for the following:    Squamous Epithelial / LPF FEW (*)    Bacteria, UA MANY (*)    All other components within normal limits  RAPID STREP SCREEN (NOT AT [facility])  CULTURE, GROUP A STREP  PREGNANCY, URINE    Imaging Review Dg Chest 2 View  03/18/2015   CLINICAL DATA:  Productive cough today.  EXAM: CHEST  2 VIEW  COMPARISON:  PA and lateral chest 08/05/2013 and 10/08/2012.  FINDINGS: Heart size and mediastinal contours are  within normal limits. Both lungs are clear. Visualized skeletal structures are unremarkable.  IMPRESSION: Negative exam.   Electronically Signed   By: Inge Rise M.D.   On: 03/18/2015 18:45     EKG Interpretation None      MDM   Final diagnoses:  Pharyngitis  Bronchitis    29 year old female here with sore throat and cough for a week. She is not a smoker. She's had no fever. She's had summary as a sore throat. Lungs are clear. Chest x-ray is normal. With one week of symptoms will give Z-Pak for likely bronchitis.    I personally performed the services described in this documentation, which was scribed in my presence. The recorded information has been reviewed and is accurate.      Evelina Bucy, MD 03/18/15 2325

## 2015-03-21 LAB — CULTURE, GROUP A STREP: STREP A CULTURE: NEGATIVE

## 2015-04-21 ENCOUNTER — Encounter (HOSPITAL_BASED_OUTPATIENT_CLINIC_OR_DEPARTMENT_OTHER): Payer: Self-pay | Admitting: *Deleted

## 2015-04-21 ENCOUNTER — Emergency Department (HOSPITAL_BASED_OUTPATIENT_CLINIC_OR_DEPARTMENT_OTHER)
Admission: EM | Admit: 2015-04-21 | Discharge: 2015-04-21 | Disposition: A | Discharge status: Home / Self Care | Payer: BLUE CROSS/BLUE SHIELD | Attending: Emergency Medicine | Admitting: Emergency Medicine

## 2015-04-21 DIAGNOSIS — Z8619 Personal history of other infectious and parasitic diseases: Secondary | ICD-10-CM | POA: Insufficient documentation

## 2015-04-21 DIAGNOSIS — H6001 Abscess of right external ear: Secondary | ICD-10-CM

## 2015-04-21 DIAGNOSIS — Z87891 Personal history of nicotine dependence: Secondary | ICD-10-CM | POA: Insufficient documentation

## 2015-04-21 DIAGNOSIS — Z87440 Personal history of urinary (tract) infections: Secondary | ICD-10-CM | POA: Diagnosis not present

## 2015-04-21 MED ORDER — LIDOCAINE HCL (PF) 2 % IJ SOLN
INTRAMUSCULAR | Status: AC
  Administered 2015-04-21: 5 mL
  Filled 2015-04-21: qty 2

## 2015-04-21 MED ORDER — CEPHALEXIN 500 MG PO CAPS: 500.0000 mg | ORAL_CAPSULE | Freq: Four times a day (QID) | ORAL | Status: DC

## 2015-04-21 MED ORDER — HYDROCODONE-ACETAMINOPHEN 5-325 MG PO TABS: 1.0000 | ORAL_TABLET | ORAL | Status: DC | PRN

## 2015-04-21 MED ORDER — LIDOCAINE HCL (PF) 1 % IJ SOLN: 5.0000 mL | Freq: Once | INTRAMUSCULAR | Status: DC

## 2015-04-21 NOTE — ED Notes (Signed)
Reports abscess inside her right ear.

## 2015-04-21 NOTE — ED Provider Notes (Signed)
CSN: [id number]     Arrival date & time 04/21/15  1119 History   First MD Initiated Contact with Patient 04/21/15 1250     Chief Complaint  Patient presents with  . Abscess   HPI Patient presents to the emergency room for worsening pain and swelling in her right ear canal. Patient noticed a bump in her ear several days ago. She initially thought it was just a small blackhead. Swelling has progressed and she's had increasing pain. Now the pain is severe. deniesany fevers or chills. No rashes or swelling. Past Medical History  Diagnosis Date  . Infection     Valtrex for HSV 2009   . HSV (herpes simplex virus) infection   . Urinary tract infection    Past Surgical History  Procedure Laterality Date  . Cholecystectomy  10/15/2012    Procedure: LAPAROSCOPIC CHOLECYSTECTOMY WITH INTRAOPERATIVE CHOLANGIOGRAM;  Surgeon: Shann Medal, MD;  Location: [facility];  Service: General;  Laterality: N/A;   Family History  Problem Relation Age of Onset  . Hypertension Mother   . Hypertension Maternal Aunt   . Hypertension Maternal Uncle   . Cancer Maternal Grandmother   . Hypertension Maternal Grandmother   . Hypertension Maternal Grandfather   . Hypertension Paternal Grandmother   . Stroke Paternal Grandmother   . Hypertension Paternal Grandfather   . Stroke Paternal Grandfather    History  Substance Use Topics  . Smoking status: Former Smoker -- 2 years    Quit date: 12/16/2011  . Smokeless tobacco: Never Used  . Alcohol Use: Yes     Comment: occassionally   OB History    Gravida Para Term Preterm AB TAB SAB Ectopic Multiple Living   4 3 3  0 1 0 1 0 0 3     Review of Systems  All other systems reviewed and are negative.     Allergies  Review of patient's allergies indicates no known allergies.  Home Medications   Prior to Admission medications   Medication Sig Start Date End Date Taking? Authorizing Provider  cephALEXin (KEFLEX) 500 MG capsule Take 1 capsule (500 mg total) by  mouth 4 (four) times daily. 04/21/15   Dorie Rank, MD  HYDROcodone-acetaminophen (NORCO/VICODIN) 5-325 MG per tablet Take 1 tablet by mouth every 4 (four) hours as needed. 04/21/15   Dorie Rank, MD  levonorgestrel (MIRENA) 20 MCG/24HR IUD 1 each by Intrauterine route once.    Historical Provider, MD  valACYclovir (VALTREX) 500 MG tablet Take 500 mg by mouth daily as needed (break out).    Historical Provider, MD   BP 149/88 mmHg  Pulse 61  Temp(Src) 98.3 F (36.8 C) (Oral)  Resp 18  Ht 5\' 3"  (1.6 m)  Wt 160 lb (72.576 kg)  BMI 28.35 kg/m2  SpO2 100% Physical Exam  Constitutional: She appears well-developed and well-nourished. No distress.  HENT:  Head: Normocephalic and atraumatic.  Right Ear: External ear normal.  Left Ear: External ear normal.  Approximately half centimeter raised area of tissue in the right stone or auditory canal 'no surrounding erythema  Eyes: Conjunctivae are normal. Right eye exhibits no discharge. Left eye exhibits no discharge. No scleral icterus.  Neck: Neck supple. No tracheal deviation present.  Cardiovascular: Normal rate.   Pulmonary/Chest: Effort normal. No stridor. No respiratory distress.  Musculoskeletal: She exhibits no edema.  Neurological: She is alert. Cranial nerve deficit: no gross deficits.  Skin: Skin is warm and dry. No rash noted.  Psychiatric: She has a normal  mood and affect.  Nursing note and vitals reviewed.   ED Course  INCISION AND DRAINAGE Date/Time: 04/21/2015 1:34 PM Performed by: Dorie Rank Authorized by: Dorie Rank Consent: Verbal consent obtained. Risks and benefits: risks, benefits and alternatives were discussed Consent given by: patient Type: abscess Location: right ear canal. Anesthesia: local infiltration Local anesthetic: lidocaine 2% without epinephrine Patient sedated: no Scalpel size: 11 Incision type: single straight Complexity: simple Drainage: purulent Drainage amount: scant Patient tolerance: Patient  tolerated the procedure well with no immediate complications   (including critical care time) Labs Review Labs Reviewed - No data to display  Imaging Review No results found.   EKG Interpretation None      MDM   Final diagnoses:  Abscess, ear canal, right    Small amount of purulent and sebaceous material obtained.  Pt tolerated well.  Dc home with pain meds and abx.  If possible warm compresses to the area.    Dorie Rank, MD 04/21/15 1336

## 2015-04-21 NOTE — Discharge Instructions (Signed)

## 2015-06-01 ENCOUNTER — Emergency Department (HOSPITAL_BASED_OUTPATIENT_CLINIC_OR_DEPARTMENT_OTHER)
Admission: EM | Admit: 2015-06-01 | Discharge: 2015-06-01 | Disposition: A | Discharge status: Home / Self Care | Payer: BLUE CROSS/BLUE SHIELD | Attending: Emergency Medicine | Admitting: Emergency Medicine

## 2015-06-01 ENCOUNTER — Encounter (HOSPITAL_BASED_OUTPATIENT_CLINIC_OR_DEPARTMENT_OTHER): Payer: Self-pay

## 2015-06-01 ENCOUNTER — Emergency Department (HOSPITAL_BASED_OUTPATIENT_CLINIC_OR_DEPARTMENT_OTHER): Payer: BLUE CROSS/BLUE SHIELD

## 2015-06-01 DIAGNOSIS — M5416 Radiculopathy, lumbar region: Secondary | ICD-10-CM | POA: Insufficient documentation

## 2015-06-01 DIAGNOSIS — Z87440 Personal history of urinary (tract) infections: Secondary | ICD-10-CM | POA: Diagnosis not present

## 2015-06-01 DIAGNOSIS — Z043 Encounter for examination and observation following other accident: Secondary | ICD-10-CM | POA: Diagnosis not present

## 2015-06-01 DIAGNOSIS — Z8619 Personal history of other infectious and parasitic diseases: Secondary | ICD-10-CM | POA: Insufficient documentation

## 2015-06-01 DIAGNOSIS — Y9389 Activity, other specified: Secondary | ICD-10-CM | POA: Insufficient documentation

## 2015-06-01 DIAGNOSIS — R200 Anesthesia of skin: Secondary | ICD-10-CM | POA: Diagnosis present

## 2015-06-01 DIAGNOSIS — Z87891 Personal history of nicotine dependence: Secondary | ICD-10-CM | POA: Diagnosis not present

## 2015-06-01 DIAGNOSIS — Y9289 Other specified places as the place of occurrence of the external cause: Secondary | ICD-10-CM | POA: Diagnosis not present

## 2015-06-01 DIAGNOSIS — W1839XA Other fall on same level, initial encounter: Secondary | ICD-10-CM | POA: Diagnosis not present

## 2015-06-01 DIAGNOSIS — Y998 Other external cause status: Secondary | ICD-10-CM | POA: Diagnosis not present

## 2015-06-01 DIAGNOSIS — Z792 Long term (current) use of antibiotics: Secondary | ICD-10-CM | POA: Insufficient documentation

## 2015-06-01 LAB — CBG MONITORING, ED: Glucose-Capillary: 96 mg/dL (ref 65–99)

## 2015-06-01 MED ORDER — HYDROCODONE-ACETAMINOPHEN 5-325 MG PO TABS
2.0000 | ORAL_TABLET | Freq: Once | ORAL | Status: AC
  Administered 2015-06-01: 2 via ORAL
  Filled 2015-06-01: qty 2

## 2015-06-01 MED ORDER — OXYCODONE-ACETAMINOPHEN 5-325 MG PO TABS: 1.0000 | ORAL_TABLET | Freq: Four times a day (QID) | ORAL | Status: DC | PRN

## 2015-06-01 NOTE — ED Provider Notes (Signed)
CSN: [id number]     Arrival date & time 06/01/15  2022 History  This chart was scribed for Kirsten Dakin, MD by Rayna Sexton, ED scribe. This patient was seen in room MH09/MH09 and the patient's care was started at 8:37 PM.   Chief Complaint  Patient presents with  . Numbness   The history is provided by the patient. No language interpreter was used.    HPI Comments: Kirsten Long is a 29 y.o. female who presents to the Emergency Department complaining of constant, moderate, radiating, right lower back pain with onset 1 day ago. Pt notes that her symptoms began as right lower back pain that radiates to the anterior aspect of her right thigh with a mild tingling feeling that radiates down her right leg to her right foot. She notes a worsening of her pain with movement. Pt notes taking 1x naproxen 1 day ago with provided some relief. She also notes a minor fall earlier today due to her symptoms and denies any injury due to the fall. She notes recently being dx with Ankylosing spondylitis by her rheumatologist. Pain is worse with walking and improved with rest. No fever no loss of bladder or bowel control. No other associated symptoms Pt notes a hx of iritis and has been seeing an optometrist but denies she has experienced these symptoms recently. She denies any hx of smoking or use of illegal drugs. Pt denies taking any other medications and any known drug allergies. Pt denies any numbness or weakness in her UE's or face, visual disturbances or incontinence of her bowels or bladder  PCP: Dr. Marisue Humble  Past Medical History  Diagnosis Date  . Infection     Valtrex for HSV 2009   . HSV (herpes simplex virus) infection   . Urinary tract infection    Past Surgical History  Procedure Laterality Date  . Cholecystectomy  10/15/2012    Procedure: LAPAROSCOPIC CHOLECYSTECTOMY WITH INTRAOPERATIVE CHOLANGIOGRAM;  Surgeon: Shann Medal, MD;  Location: [facility];  Service: General;  Laterality:  N/A;   Family History  Problem Relation Age of Onset  . Hypertension Mother   . Hypertension Maternal Aunt   . Hypertension Maternal Uncle   . Cancer Maternal Grandmother   . Hypertension Maternal Grandmother   . Hypertension Maternal Grandfather   . Hypertension Paternal Grandmother   . Stroke Paternal Grandmother   . Hypertension Paternal Grandfather   . Stroke Paternal Grandfather    Social History  Substance Use Topics  . Smoking status: Former Smoker -- 2 years    Quit date: 12/16/2011  . Smokeless tobacco: Never Used  . Alcohol Use: Yes     Comment: occassionally   OB History    Gravida Para Term Preterm AB TAB SAB Ectopic Multiple Living   4 3 3  0 1 0 1 0 0 3     Review of Systems  Musculoskeletal: Positive for back pain and gait problem.       Hurts to walk walking with small shuffling steps for past 2 days  Neurological: Positive for numbness.       Numbness in right lower extremity    Allergies  Review of patient's allergies indicates no known allergies.  Home Medications   Prior to Admission medications   Medication Sig Start Date End Date Taking? Authorizing Provider  levonorgestrel (MIRENA) 20 MCG/24HR IUD 1 each by Intrauterine route once.   Yes Historical Provider, MD  cephALEXin (KEFLEX) 500 MG capsule Take 1 capsule (500  mg total) by mouth 4 (four) times daily. 04/21/15   Dorie Rank, MD  HYDROcodone-acetaminophen (NORCO/VICODIN) 5-325 MG per tablet Take 1 tablet by mouth every 4 (four) hours as needed. 04/21/15   Dorie Rank, MD  valACYclovir (VALTREX) 500 MG tablet Take 500 mg by mouth daily as needed (break out).    Historical Provider, MD   LMP 06/01/2015 Physical Exam  Constitutional: She is oriented to person, place, and time. She appears well-developed and well-nourished. She appears distressed.  Appears mildly uncomfortable [place] coma score 15  HENT:  Head: Normocephalic and atraumatic.  No facial asymmetry  Eyes: Conjunctivae are normal. Pupils  are equal, round, and reactive to light.  Neck: Neck supple. No tracheal deviation present. No thyromegaly present.  Cardiovascular: Normal rate, regular rhythm and normal heart sounds.   No murmur heard. Pulmonary/Chest: Effort normal and breath sounds normal.  Abdominal: Soft. Bowel sounds are normal. She exhibits no distension. There is no tenderness.  Musculoskeletal: Normal range of motion. She exhibits no edema or tenderness.  Tender at right paralumbar area no spinous tenderness. Low back pain is exacerbated by flexing at waist  Neurological: She is alert and oriented to person, place, and time. She has normal reflexes. Coordination normal.  DTRs symmetric bilaterally at knee jerk biceps toes or going bilaterally  Skin: Skin is warm and dry. No rash noted.  Psychiatric: She has a normal mood and affect.  Nursing note and vitals reviewed.   ED Course  Procedures  COORDINATION OF CARE: 8:42 PM Discussed treatment plan with pt at bedside and pt agreed to plan.  Labs Review Labs Reviewed  CBG MONITORING, ED    Imaging Review No results found. I, Rayna Sexton, personally reviewed and evaluated these images and lab results as part of my medical decision-making.   EKG Interpretation None     10:50 PM pain not improved after treatment with Norco, however she does feel ready to go home. She is able to ambulate without assistance however gait is shuffling. X-rays viewed by me. Results for orders placed or performed during the hospital encounter of 06/01/15  CBG monitoring, ED  Result Value Ref Range   Glucose-Capillary 96 65 - 99 mg/dL   Dg Lumbar Spine Complete  06/01/2015   CLINICAL DATA:  Right lower back pain x1 day  EXAM: LUMBAR SPINE - COMPLETE 4+ VIEW  COMPARISON:  None.  FINDINGS: Five lumbar type vertebral bodies.  Normal lumbar lordosis.  No evidence of fracture or dislocation. Vertebral body heights and intervertebral disc spaces are maintained.  Visualized bony  pelvis appears intact.  Cholecystectomy clips.  IUD overlies the pelvis.  IMPRESSION: Normal lumbar spine radiographs.   Electronically Signed   By: Julian Hy M.D.   On: 06/01/2015 22:00    MDM  Plan prescription Percocet. Follow-up Dr.Ehinger if not improved by next week Diagnosis lumbar radiculopathy Final diagnoses:  None          Kirsten Dakin, MD 06/01/15 2257

## 2015-06-01 NOTE — ED Notes (Addendum)
Pt reports right leg numbness/pain/tingling since yesterday. Pt also has right sided facial asymmetry that she was unaware of. Pt drove herself to ED and was unable to ambulate once she got here. Assisted to room 9. EDP called to bedside.

## 2015-06-01 NOTE — Discharge Instructions (Signed)
Take Tylenol for mild pain or the pain medicine prescribed for bad pain. Don't take Tylenol and the pain medicine prescribed together as a combination to be dangerous. Call Dr.Ehinger to schedule office appointment if not feeling better by next week Back Pain, Adult Back pain is very common. The pain often gets better over time. The cause of back pain is usually not dangerous. Most people can learn to manage their back pain on their own.  HOME CARE   Stay active. Start with short walks on flat ground if you can. Try to walk farther each day.  Do not sit, drive, or stand in one place for more than 30 minutes. Do not stay in bed.  Do not avoid exercise or work. Activity can help your back heal faster.  Be careful when you bend or lift an object. Bend at your knees, keep the object close to you, and do not twist.  Sleep on a firm mattress. Lie on your side, and bend your knees. If you lie on your back, put a pillow under your knees.  Only take medicines as told by your doctor.  Put ice on the injured area.  Put ice in a plastic bag.  Place a towel between your skin and the bag.  Leave the ice on for 15-20 minutes, 03-04 times a day for the first 2 to 3 days. After that, you can switch between ice and heat packs.  Ask your doctor about back exercises or massage.  Avoid feeling anxious or stressed. Find good ways to deal with stress, such as exercise. GET HELP RIGHT AWAY IF:   Your pain does not go away with rest or medicine.  Your pain does not go away in 1 week.  You have new problems.  You do not feel well.  The pain spreads into your legs.  You cannot control when you poop (bowel movement) or pee (urinate).  Your arms or legs feel weak or lose feeling (numbness).  You feel sick to your stomach (nauseous) or throw up (vomit).  You have belly (abdominal) pain.  You feel like you may pass out (faint). MAKE SURE YOU:   Understand these instructions.  Will watch your  condition.  Will get help right away if you are not doing well or get worse. Document Released: 03/24/2008 Document Revised: 12/29/2011 Document Reviewed: 02/07/2014 [facility] Patient Information 2015 [place], [place]. This information is not intended to replace advice given to you by your health care provider. Make sure you discuss any questions you have with your health care provider.

## 2015-06-01 NOTE — ED Notes (Signed)
I took CBG and got result of 96 mg. / dcltr.

## 2015-06-03 ENCOUNTER — Emergency Department (HOSPITAL_COMMUNITY)
Admission: EM | Admit: 2015-06-03 | Discharge: 2015-06-03 | Disposition: A | Discharge status: Home / Self Care | Payer: BLUE CROSS/BLUE SHIELD | Attending: Emergency Medicine | Admitting: Emergency Medicine

## 2015-06-03 ENCOUNTER — Emergency Department (HOSPITAL_COMMUNITY): Payer: BLUE CROSS/BLUE SHIELD

## 2015-06-03 ENCOUNTER — Encounter (HOSPITAL_COMMUNITY): Payer: Self-pay | Admitting: Emergency Medicine

## 2015-06-03 DIAGNOSIS — Z792 Long term (current) use of antibiotics: Secondary | ICD-10-CM | POA: Insufficient documentation

## 2015-06-03 DIAGNOSIS — Z87440 Personal history of urinary (tract) infections: Secondary | ICD-10-CM | POA: Diagnosis not present

## 2015-06-03 DIAGNOSIS — Z3202 Encounter for pregnancy test, result negative: Secondary | ICD-10-CM | POA: Diagnosis not present

## 2015-06-03 DIAGNOSIS — R52 Pain, unspecified: Secondary | ICD-10-CM

## 2015-06-03 DIAGNOSIS — Z87891 Personal history of nicotine dependence: Secondary | ICD-10-CM | POA: Diagnosis not present

## 2015-06-03 DIAGNOSIS — Z8619 Personal history of other infectious and parasitic diseases: Secondary | ICD-10-CM | POA: Insufficient documentation

## 2015-06-03 DIAGNOSIS — M25451 Effusion, right hip: Secondary | ICD-10-CM | POA: Diagnosis not present

## 2015-06-03 DIAGNOSIS — M1990 Unspecified osteoarthritis, unspecified site: Secondary | ICD-10-CM | POA: Diagnosis not present

## 2015-06-03 DIAGNOSIS — M2540 Effusion, unspecified joint: Secondary | ICD-10-CM

## 2015-06-03 DIAGNOSIS — M25551 Pain in right hip: Secondary | ICD-10-CM

## 2015-06-03 HISTORY — DX: Unspecified osteoarthritis, unspecified site: M19.90

## 2015-06-03 LAB — CBC WITH DIFFERENTIAL/PLATELET
BASOS PCT: 1 % (ref 0–1)
Basophils Absolute: 0.1 10*3/uL (ref 0.0–0.1)
EOS ABS: 0 10*3/uL (ref 0.0–0.7)
Eosinophils Relative: 0 % (ref 0–5)
HCT: 37.1 % (ref 36.0–46.0)
Hemoglobin: 12.6 g/dL (ref 12.0–15.0)
LYMPHS PCT: 18 % (ref 12–46)
Lymphs Abs: 1.5 10*3/uL (ref 0.7–4.0)
MCH: 29.6 pg (ref 26.0–34.0)
MCHC: 34 g/dL (ref 30.0–36.0)
MCV: 87.3 fL (ref 78.0–100.0)
MONO ABS: 0.4 10*3/uL (ref 0.1–1.0)
Monocytes Relative: 5 % (ref 3–12)
NEUTROS ABS: 6.3 10*3/uL (ref 1.7–7.7)
NEUTROS PCT: 76 % (ref 43–77)
Platelets: 395 10*3/uL (ref 150–400)
RBC: 4.25 MIL/uL (ref 3.87–5.11)
RDW: 14.1 % (ref 11.5–15.5)
WBC: 8.3 10*3/uL (ref 4.0–10.5)

## 2015-06-03 LAB — I-STAT BETA HCG BLOOD, ED (MC, WL, AP ONLY): I-stat hCG, quantitative: <5 m[IU]/mL (ref <5)

## 2015-06-03 LAB — BASIC METABOLIC PANEL
ANION GAP: 8 (ref 5–15)
BUN: 9 mg/dL (ref 6–20)
CALCIUM: 9.4 mg/dL (ref 8.9–10.3)
CO2: 27 mmol/L (ref 22–32)
CREATININE: 0.74 mg/dL (ref 0.44–1.00)
Chloride: 102 mmol/L (ref 101–111)
Glucose, Bld: 107 mg/dL — ABNORMAL HIGH (ref 65–99)
Potassium: 3.4 mmol/L — ABNORMAL LOW (ref 3.5–5.1)
SODIUM: 137 mmol/L (ref 135–145)

## 2015-06-03 LAB — SYNOVIAL CELL COUNT + DIFF, W/ CRYSTALS
CRYSTALS FLUID: NONE SEEN
Lymphocytes-Synovial Fld: 8 % (ref 0–20)
Monocyte-Macrophage-Synovial Fluid: 4 % — ABNORMAL LOW (ref 50–90)
NEUTROPHIL, SYNOVIAL: 88 % — AB (ref 0–25)
WBC, Synovial: 29980 /mm3 — ABNORMAL HIGH (ref 0–200)

## 2015-06-03 LAB — GRAM STAIN

## 2015-06-03 LAB — C-REACTIVE PROTEIN: CRP: 10.6 mg/dL — ABNORMAL HIGH (ref <1.0)

## 2015-06-03 LAB — SEDIMENTATION RATE: SED RATE: 79 mm/h — AB (ref 0–22)

## 2015-06-03 MED ORDER — ORPHENADRINE CITRATE 30 MG/ML IJ SOLN
60.0000 mg | Freq: Once | INTRAMUSCULAR | Status: DC
  Filled 2015-06-03: qty 2

## 2015-06-03 MED ORDER — HYDROMORPHONE HCL 1 MG/ML IJ SOLN
1.0000 mg | Freq: Once | INTRAMUSCULAR | Status: AC
  Administered 2015-06-03: 1 mg via INTRAVENOUS
  Filled 2015-06-03: qty 1

## 2015-06-03 MED ORDER — SODIUM CHLORIDE 0.9 % IV BOLUS (SEPSIS)
1000.0000 mL | Freq: Once | INTRAVENOUS | Status: AC
  Administered 2015-06-03: 1000 mL via INTRAVENOUS

## 2015-06-03 MED ORDER — KETOROLAC TROMETHAMINE 30 MG/ML IJ SOLN
30.0000 mg | Freq: Once | INTRAMUSCULAR | Status: AC
  Administered 2015-06-03: 30 mg via INTRAVENOUS
  Filled 2015-06-03: qty 1

## 2015-06-03 MED ORDER — OXYCODONE-ACETAMINOPHEN 5-325 MG PO TABS: 1.0000 | ORAL_TABLET | Freq: Four times a day (QID) | ORAL | Status: DC | PRN

## 2015-06-03 MED ORDER — INDOMETHACIN 25 MG PO CAPS: 25.0000 mg | ORAL_CAPSULE | Freq: Three times a day (TID) | ORAL | Status: DC | PRN

## 2015-06-03 MED ORDER — METHOCARBAMOL 1000 MG/10ML IJ SOLN
1000.0000 mg | Freq: Once | INTRAMUSCULAR | Status: AC
  Administered 2015-06-03: 1000 mg via INTRAMUSCULAR
  Filled 2015-06-03: qty 10

## 2015-06-03 MED ORDER — DIAZEPAM 5 MG/ML IJ SOLN
2.5000 mg | Freq: Once | INTRAMUSCULAR | Status: AC
  Administered 2015-06-03: 2.5 mg via INTRAVENOUS
  Filled 2015-06-03: qty 2

## 2015-06-03 MED ORDER — GADOBENATE DIMEGLUMINE 529 MG/ML IV SOLN: 15.0000 mL | Freq: Once | INTRAVENOUS | Status: DC | PRN

## 2015-06-03 NOTE — ED Notes (Signed)
Patient returned from MRI.

## 2015-06-03 NOTE — ED Provider Notes (Signed)
I discussed patient's case with the orthopedist, who has reviewed the joint aspiration results. He states that the patient has likely inflammatory arthralgia, low suspicion for septic arthralgia.  I informed the patient and her mother all findings, answered all questions, the patient will follow up with rheumatology.   Carmin Muskrat, MD 06/03/15 2041

## 2015-06-03 NOTE — Procedures (Signed)
Interventional Radiology Procedure Note  Procedure:  Right hip joint aspiration under fluoroscopy  Complications: None  Estimated Blood Loss: None  UPT negative prior to procedure. Right hip joint localized under fluoro.  20 G spinal needle advanced to level of femoral neck. Aspiration immediately yielded yellow colored joint fluid; not overtly purulent. 13 mL of fluid able to be aspirated from joint.  Sent for requested labs.  Venetia Night. Kathlene Cote, M.D Pager:  [phone]

## 2015-06-03 NOTE — ED Notes (Signed)
Pt here with right leg pain that started last Thursday , pt was told at [facility] that she may need a MRI of her lower back , pt is able to walk but states it is very painful

## 2015-06-03 NOTE — ED Notes (Signed)
Patient transported to MRI 

## 2015-06-03 NOTE — ED Provider Notes (Signed)
CSN: [id number]     Arrival date & time 06/03/15  0901 History   First MD Initiated Contact with Patient 06/03/15 [phone]     Chief Complaint  Patient presents with  . Leg Pain     (Consider location/radiation/quality/duration/timing/severity/associated sxs/prior Treatment) HPI Pain started approximately 4 days ago on Thursday. At onset she reports it was not very severe. Pain was most noticeable in the right upper leg. Pain progressed significantly and became severe with movement of her back and time to walk. Patient also developed tingling that went down the entirety of her leg. She denies that the pain is severe within her back however if she tries to bend forward or twists the pain in her leg is much worse. She does have a history of ankylosing spondylitis, she reports typically over-the-counter pain medication has been adequate and prior to this episode, debilitating back pain or sciatica has not been an issue. She was seen at [facility] and started on pain medications and anti-inflammatory medications Friday. She reports that the pain medications make her drowsy but the pain continues to worsen. She is now having to lift up her leg to reposition and move it in a very awkward way to walk with associated severe pain. She denies any bowel or bladder dysfunction. She denies abdominal pain. She reports however she has felt nauseated and if she tries to eat she vomits. Past Medical History  Diagnosis Date  . Infection     Valtrex for HSV 2009   . HSV (herpes simplex virus) infection   . Urinary tract infection   . Arthritis    Past Surgical History  Procedure Laterality Date  . Cholecystectomy  10/15/2012    Procedure: LAPAROSCOPIC CHOLECYSTECTOMY WITH INTRAOPERATIVE CHOLANGIOGRAM;  Surgeon: Shann Medal, MD;  Location: [facility];  Service: General;  Laterality: N/A;   Family History  Problem Relation Age of Onset  . Hypertension Mother   . Hypertension Maternal Aunt   .  Hypertension Maternal Uncle   . Cancer Maternal Grandmother   . Hypertension Maternal Grandmother   . Hypertension Maternal Grandfather   . Hypertension Paternal Grandmother   . Stroke Paternal Grandmother   . Hypertension Paternal Grandfather   . Stroke Paternal Grandfather    Social History  Substance Use Topics  . Smoking status: Former Smoker -- 2 years    Quit date: 12/16/2011  . Smokeless tobacco: Never Used  . Alcohol Use: Yes     Comment: occassionally   OB History    Gravida Para Term Preterm AB TAB SAB Ectopic Multiple Living   4 3 3  0 1 0 1 0 0 3     Review of Systems 10 Systems reviewed and are negative for acute change except as noted in the HPI.    Allergies  Review of patient's allergies indicates no known allergies.  Home Medications   Prior to Admission medications   Medication Sig Start Date End Date Taking? Authorizing Provider  cephALEXin (KEFLEX) 500 MG capsule Take 1 capsule (500 mg total) by mouth 4 (four) times daily. 04/21/15   Dorie Rank, MD  HYDROcodone-acetaminophen (NORCO/VICODIN) 5-325 MG per tablet Take 1 tablet by mouth every 4 (four) hours as needed. 04/21/15   Dorie Rank, MD  levonorgestrel (MIRENA) 20 MCG/24HR IUD 1 each by Intrauterine route once.    Historical Provider, MD  oxyCODONE-acetaminophen (PERCOCET) 5-325 MG per tablet Take 1-2 tablets by mouth every 6 (six) hours as needed for severe pain. 06/01/15  [name], MD  valACYclovir (VALTREX) 500 MG tablet Take 500 mg by mouth daily as needed (break out).    Historical Provider, MD   BP 140/94 mmHg  Pulse 85  Temp(Src) 98.4 F (36.9 C) (Oral)  Resp 18  Ht 5\' 3"  (1.6 m)  SpO2 100%  LMP 06/01/2015 Physical Exam  Constitutional: She is oriented to person, place, and time. She appears well-developed and well-nourished.  Patient is in severe pain with certain movements but otherwise is alert and appropriate. She is nontoxic without any respiratory symptoms.  HENT:  Head:  Normocephalic and atraumatic.  Eyes: EOM are normal.  Neck: Neck supple.  Cardiovascular: Normal rate, regular rhythm, normal heart sounds and intact distal pulses.   Pulmonary/Chest: Effort normal and breath sounds normal.  Abdominal: Soft. Bowel sounds are normal. She exhibits no distension. There is no tenderness.  Musculoskeletal: Normal range of motion. She exhibits no edema or tenderness.  Palpation of the soft tissues of the thigh and entirety of the right leg is nontender with normal tissue quality. The feet are warm and dry and there is no peripheral edema. Patient does have severe pain with straight leg raise on the right. This reproduces the pain to her anterior thigh which is extremely sharp. This pain is also reproduced with forward flexion. The patient is able to stand with much pain.  Neurological: She is alert and oriented to person, place, and time. She has normal strength. No cranial nerve deficit. She exhibits normal muscle tone. Coordination normal. GCS eye subscore is 4. GCS verbal subscore is 5. GCS motor subscore is 6.  Skin: Skin is warm, dry and intact.  Psychiatric: She has a normal mood and affect.    ED Course  Procedures (including critical care time) Labs Review Labs Reviewed  BASIC METABOLIC PANEL  CBC WITH DIFFERENTIAL/PLATELET    Imaging Review Dg Lumbar Spine Complete  06/01/2015   CLINICAL DATA:  Right lower back pain x1 day  EXAM: LUMBAR SPINE - COMPLETE 4+ VIEW  COMPARISON:  None.  FINDINGS: Five lumbar type vertebral bodies.  Normal lumbar lordosis.  No evidence of fracture or dislocation. Vertebral body heights and intervertebral disc spaces are maintained.  Visualized bony pelvis appears intact.  Cholecystectomy clips.  IUD overlies the pelvis.  IMPRESSION: Normal lumbar spine radiographs.   Electronically Signed   By: Julian Hy M.D.   On: 06/01/2015 22:00   I, Charlesetta Shanks, personally reviewed and evaluated these images and lab results as  part of my medical decision-making.   EKG Interpretation None      Consult: Radiology of musculoskeletal specialty was consults it to discuss MRI of lower extremity due to severe pain and MRI back without acute findings. At this time we'll proceed with MRI of the right lower extremity. Consult: Patient's case was reviewed with Dr. Lyla Glassing (ortho). Advises to have radiology do a needle aspiration for cell count and crystals and culture. He is to be recontacted with results. MDM   Final diagnoses:  None    Patient has severe right thigh\upper leg pain. Diagnostic workup has not identified a hip effusion as a source. Patient has not been febrile or had constitutional symptoms to suggest infection. Consult has made to orthopedic surgery and order placed for joint aspiration. Results will be called by Dr. Vanita Panda to Dr. Lillia Corporal to guide management based on aspirate.   Charlesetta Shanks, MD 06/07/15 [phone]

## 2015-06-03 NOTE — Discharge Instructions (Signed)
As discussed, your pain is likely due to inflammatory changes, creating a fluid deposition in the hip.  Physical medication as directed, but should follow up with your rheumatologist.  Return here for any concerning changes in your condition.

## 2015-06-03 NOTE — ED Notes (Signed)
Patient returned from IR

## 2015-06-03 NOTE — ED Notes (Signed)
Able to ambulate to toilet; Reports pain is still there, but improved. Limping gait.

## 2015-06-03 NOTE — ED Notes (Signed)
Patient transported to IR 

## 2015-06-04 LAB — PATHOLOGIST SMEAR REVIEW

## 2015-06-08 LAB — CULTURE, BODY FLUID-BOTTLE

## 2015-06-08 LAB — CULTURE, BODY FLUID W GRAM STAIN -BOTTLE: Culture: NO GROWTH

## 2015-09-18 ENCOUNTER — Ambulatory Visit
Admission: RE | Admit: 2015-09-18 | Discharge: 2015-09-18 | Disposition: A | Discharge status: Home / Self Care | Payer: BLUE CROSS/BLUE SHIELD | Source: Ambulatory Visit | Attending: Rheumatology | Admitting: Rheumatology

## 2015-09-18 ENCOUNTER — Other Ambulatory Visit: Payer: Self-pay | Admitting: Rheumatology

## 2015-09-18 DIAGNOSIS — Z9225 Personal history of immunosupression therapy: Secondary | ICD-10-CM

## 2017-03-18 DIAGNOSIS — H20022 Recurrent acute iridocyclitis, left eye: Secondary | ICD-10-CM | POA: Diagnosis not present

## 2017-03-27 DIAGNOSIS — H20022 Recurrent acute iridocyclitis, left eye: Secondary | ICD-10-CM | POA: Diagnosis not present

## 2017-03-29 ENCOUNTER — Emergency Department (HOSPITAL_COMMUNITY)
Admission: EM | Admit: 2017-03-29 | Discharge: 2017-03-29 | Disposition: A | Discharge status: Home / Self Care | Payer: BLUE CROSS/BLUE SHIELD | Attending: Emergency Medicine | Admitting: Emergency Medicine

## 2017-03-29 ENCOUNTER — Encounter (HOSPITAL_COMMUNITY): Payer: Self-pay | Admitting: Emergency Medicine

## 2017-03-29 DIAGNOSIS — H53142 Visual discomfort, left eye: Secondary | ICD-10-CM | POA: Insufficient documentation

## 2017-03-29 DIAGNOSIS — H209 Unspecified iridocyclitis: Secondary | ICD-10-CM

## 2017-03-29 DIAGNOSIS — Z79891 Long term (current) use of opiate analgesic: Secondary | ICD-10-CM | POA: Insufficient documentation

## 2017-03-29 DIAGNOSIS — Z79899 Other long term (current) drug therapy: Secondary | ICD-10-CM | POA: Insufficient documentation

## 2017-03-29 DIAGNOSIS — H5712 Ocular pain, left eye: Secondary | ICD-10-CM | POA: Insufficient documentation

## 2017-03-29 DIAGNOSIS — Z87891 Personal history of nicotine dependence: Secondary | ICD-10-CM | POA: Insufficient documentation

## 2017-03-29 MED ORDER — TETRACAINE HCL 0.5 % OP SOLN
2.0000 [drp] | Freq: Once | OPHTHALMIC | Status: AC
  Administered 2017-03-29: 2 [drp] via OPHTHALMIC
  Filled 2017-03-29: qty 2

## 2017-03-29 NOTE — ED Provider Notes (Signed)
[facility] DEPT Provider Note   CSN: [id number] Arrival date & time: 03/29/17  0548     History   Chief Complaint Chief Complaint  Patient presents with  . Eye Pain    HPI Kirsten Long is a 31 y.o. female.  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 Arthritis   . HSV (herpes simplex virus) infection   . Infection    Valtrex for HSV 2009   . Urinary tract infection     Patient Active Problem List   Diagnosis Date Noted  . Cholecystitis, acute 10/14/2012    Past Surgical History:  Procedure Laterality Date  . CHOLECYSTECTOMY  10/15/2012   Procedure: LAPAROSCOPIC CHOLECYSTECTOMY WITH INTRAOPERATIVE CHOLANGIOGRAM;  Surgeon: Shann Medal, MD;  Location: [facility];  Service: General;  Laterality: N/A;    OB History    Gravida Para Term Preterm AB Living   4 3 3  0 1 3   SAB TAB Ectopic Multiple Live Births   1 0 0 0 1       Home Medications    Prior to Admission medications   Medication Sig Start Date End Date Taking? Authorizing Provider  indomethacin (INDOCIN) 25 MG capsule Take 1 capsule (25 mg total) by mouth 3 (three) times daily as needed. 06/03/15   Carmin Muskrat, MD  levonorgestrel (MIRENA) 20 MCG/24HR IUD 1 each by Intrauterine route once.    [provider]  oxyCODONE-acetaminophen (PERCOCET) 5-325 MG per tablet Take 1-2 tablets by mouth every 6 (six) hours as needed for severe pain. 06/03/15   Carmin Muskrat, MD  valACYclovir (VALTREX) 500 MG tablet Take 500 mg by mouth daily as needed (break out).    [provider]    Family  History Family History  Problem Relation Age of Onset  . Hypertension Mother   . Hypertension Maternal Aunt   . Hypertension Maternal Uncle   . Cancer Maternal Grandmother   . Hypertension Maternal Grandmother   . Hypertension Maternal Grandfather   . Hypertension Paternal Grandmother   . Stroke Paternal Grandmother   . Hypertension Paternal Grandfather   . Stroke Paternal Grandfather     Social History Social History  Substance Use Topics  . Smoking status: Former Smoker    Years: 2.00    Quit date: 12/16/2011  . Smokeless tobacco: Never Used  . Alcohol use Yes     Comment: occassionally     Allergies   Patient has no known allergies.   Review of Systems Review of Systems  All other systems reviewed and are negative.    Physical Exam Updated Vital Signs BP (!) 137/99   Pulse 79   Temp 98.4 F (36.9 C) (Oral)   Ht 1.6 m (5\' 3" )   Wt 79.4 kg (175 lb)   SpO2 100%   BMI 31.00 kg/m   Physical Exam  Constitutional: She is oriented to person, place, and time. She appears well-developed and well-nourished.  Non-toxic appearance. No distress.  HENT:  Head: Normocephalic and atraumatic.  Eyes: Conjunctivae, EOM and  lids are normal. Pupils are equal, round, and reactive to light. Left eye exhibits no discharge and no exudate.  Left pupil 4 mm/ right 3 mm  Neck: Normal range of motion. Neck supple. No tracheal deviation present. No thyroid mass present.  Cardiovascular: Normal rate, regular rhythm and normal heart sounds.  Exam reveals no gallop.   No murmur heard. Pulmonary/Chest: Effort normal and breath sounds normal. No stridor. No respiratory distress. She has no decreased breath sounds. She has no wheezes. She has no rhonchi. She has no rales.  Abdominal: Soft. Normal appearance and bowel sounds are normal. She exhibits no distension. There is no tenderness. There is no rebound and no CVA tenderness.  Musculoskeletal: Normal range of motion. She exhibits no edema  or tenderness.  Neurological: She is alert and oriented to person, place, and time. She has normal strength. No cranial nerve deficit or sensory deficit. GCS eye subscore is 4. GCS verbal subscore is 5. GCS motor subscore is 6.  Skin: Skin is warm and dry. No abrasion and no rash noted.  Psychiatric: She has a normal mood and affect. Her speech is normal and behavior is normal.  Nursing note and vitals reviewed.    ED Treatments / Results  Labs (all labs ordered are listed, but only abnormal results are displayed) Labs Reviewed - No data to display  EKG  EKG Interpretation None       Radiology No results found.  Procedures Procedures (including critical care time)  Medications Ordered in ED Medications  tetracaine (PONTOCAINE) 0.5 % ophthalmic solution 2 drop (not administered)     Initial Impression / Assessment and Plan / ED Course  I have reviewed the triage vital signs and the nursing notes.  Pertinent labs & imaging results that were available during my care of the patient were reviewed by me and considered in my medical decision making (see chart for details).     pts left eye IOP is 14 Cornea is clear Visual acuity is noted Will have pt increase her steroid drops to q2 and have her f/u her opth in am   Final Clinical Impressions(s) / ED Diagnoses   Final diagnoses:  None    New Prescriptions New Prescriptions   No medications on file     Lacretia Leigh, MD 03/29/17 [phone]

## 2017-03-29 NOTE — ED Triage Notes (Signed)
Pt states she has left eye pain for several weeks now. Seen at eye doctor and dx with iritis but states the drops are making it worse

## 2017-03-29 NOTE — Discharge Instructions (Signed)
Increase your prednisolone drops to every 2 hours Follow up with your eye doctor tomorrow

## 2017-04-06 DIAGNOSIS — H20022 Recurrent acute iridocyclitis, left eye: Secondary | ICD-10-CM | POA: Diagnosis not present

## 2017-04-13 DIAGNOSIS — H20022 Recurrent acute iridocyclitis, left eye: Secondary | ICD-10-CM | POA: Diagnosis not present

## 2017-05-05 ENCOUNTER — Telehealth: Payer: Self-pay | Admitting: Radiology

## 2017-05-05 NOTE — Telephone Encounter (Signed)
Have gotten correspondence from [facility] regarding patients eye situation, she has recurrent uveitis. She has failed to make and keep appointments with [place], has not been seen in the office since 2016. The letter we received indicates patient has to pay $900 for Humira, but this will be covered by [facility], she can get this for $15, patient needs to make appt and come in for visit   Will you please call her to make appointment.

## 2017-05-20 NOTE — Telephone Encounter (Signed)
I left a message for patient to call, and schedule appt with Dr. Estanislado Pandy.

## 2017-08-09 NOTE — Progress Notes (Signed)
Office Visit Note  Patient: Kirsten Long             Date of Birth: December 29, 1985           MRN: [id number]             PCP: Gaynelle Arabian, MD Referring: Gaynelle Arabian, MD Visit Date: 08/20/2017 Occupation: '@GUAROCC' @    Subjective:   iritis episode 2 months ago, per patient.    History of Present Illness: Kirsten Long is a 31 y.o. female with history of HLA-B27 positive ankylosing spondylitis and iritis. Patient returns today after last visit in November 2016. She states she did really well on Humira while she was on it. She came off Humira about a year ago due to the cost. She's not had some joint discomfort but she has had recurrent iritis. Her last episode was 2 months ago. She was given topical prednisone for iritis by Dr. Ellie Lunch. Her SI joint pain is on and off according to patient is not unbearable. She denies any joint swelling. She reports some discomfort in her knee joints.  Activities of Daily Living:  Patient reports morning stiffness for 0 minute.   Patient Denies nocturnal pain.  Difficulty dressing/grooming: Denies Difficulty climbing stairs: Reports Difficulty getting out of chair: Denies Difficulty using hands for taps, buttons, cutlery, and/or writing: Denies   Review of Systems  Constitutional: Negative for fatigue, night sweats, weight gain, weight loss and weakness.  HENT: Negative for mouth sores, trouble swallowing, trouble swallowing, mouth dryness and nose dryness.   Eyes: Positive for redness. Negative for pain, visual disturbance and dryness.  Respiratory: Negative for cough, shortness of breath and difficulty breathing.   Cardiovascular: Positive for hypertension. Negative for chest pain, palpitations, irregular heartbeat and swelling in legs/feet.  Gastrointestinal: Negative for blood in stool, constipation and diarrhea.  Endocrine: Negative for increased urination.  Genitourinary: Negative for vaginal dryness.  Musculoskeletal:  Negative for arthralgias, joint pain, joint swelling, myalgias, muscle weakness, morning stiffness, muscle tenderness and myalgias.  Skin: Negative for color change, rash, hair loss, skin tightness, ulcers and sensitivity to sunlight.  Allergic/Immunologic: Negative for susceptible to infections.  Neurological: Negative for dizziness, memory loss and night sweats.  Hematological: Negative for swollen glands.  Psychiatric/Behavioral: Negative for depressed mood and sleep disturbance. The patient is not nervous/anxious.     PMFS History:  Patient Active Problem List   Diagnosis Date Noted  . Ankylosing spondylitis of multiple sites in spine ([place]) 08/20/2017  . Sacroiliitis ([place]) 08/20/2017  . Iritis 08/20/2017  . History of panic attacks 08/20/2017  . Essential hypertension 08/20/2017  . History of cholecystectomy 08/20/2017  . Ex-smoker 08/20/2017  . Cholecystitis, acute 10/14/2012    Past Medical History:  Diagnosis Date  . Arthritis   . HSV (herpes simplex virus) infection   . Infection    Valtrex for HSV 2009   . Urinary tract infection     Family History  Problem Relation Age of Onset  . Hypertension Mother   . Hypertension Maternal Aunt   . Hypertension Maternal Uncle   . Cancer Maternal Grandmother   . Hypertension Maternal Grandmother   . Hypertension Maternal Grandfather   . Hypertension Paternal Grandmother   . Stroke Paternal Grandmother   . Hypertension Paternal Grandfather   . Stroke Paternal Grandfather   . Hypertension Father    Past Surgical History:  Procedure Laterality Date  . CHOLECYSTECTOMY  10/15/2012   Procedure: LAPAROSCOPIC CHOLECYSTECTOMY WITH INTRAOPERATIVE CHOLANGIOGRAM;  Surgeon:  Shann Medal, MD;  Location: [facility];  Service: General;  Laterality: N/A;   Social History   Social History Narrative  . No narrative on file     Objective: Vital Signs: BP (!) 142/104 (BP Location: Left Arm, Patient Position: Sitting, Cuff Size: Normal)    Pulse 83   Ht '5\' 3"'  (1.6 m)   Wt 176 lb (79.8 kg)   BMI 31.18 kg/m    Physical Exam  Constitutional: She is oriented to person, place, and time. She appears well-developed and well-nourished.  HENT:  Head: Normocephalic and atraumatic.  Eyes: Conjunctivae and EOM are normal.  Mild conjunctival injection  Neck: Normal range of motion.  Cardiovascular: Normal rate, regular rhythm, normal heart sounds and intact distal pulses.   Pulmonary/Chest: Effort normal and breath sounds normal.  Abdominal: Soft. Bowel sounds are normal.  Lymphadenopathy:    She has no cervical adenopathy.  Neurological: She is alert and oriented to person, place, and time.  Skin: Skin is warm and dry. Capillary refill takes less than 2 seconds.  Psychiatric: She has a normal mood and affect. Her behavior is normal.  Nursing note and vitals reviewed.    Musculoskeletal Exam: C-spine and thoracic lumbar spine good range of motion. She had no SI joint tenderness. Shoulder joints although joints was joint MCPs PIPs DIPs with good range of motion with no synovitis. Hip joints knee joints ankles MTPs PIPs DIPs with good range of motion with no synovitis.  CDAI Exam: CDAI Homunculus Exam:   Tenderness:  RLE: tibiofemoral LLE: tibiofemoral  Joint Counts:  CDAI Tender Joint count: 2 CDAI Swollen Joint count: 0  Global Assessments:  Patient Global Assessment: 4 Provider Global Assessment: 4  CDAI Calculated Score: 10    Investigation: Findings:  Labs from July 03, 2015 show all autoimmune work-up is negative.  CMP is normal.  Acute hepatitis panel is negative.  G6PD is negative.  CK is negative.  TSH is negative.  HIV is negative.  IgG, IgA, and IgM are negative.  Rheumatoid factor is negative.  ANA is negative.  CCP is negative.  SPEP M-spike is negative.  Urinalysis is negative.  Microscopic urine is negative.  TB-Gold is negative.   August 2016:  Synovial fluid showed WBC count of 29,980 with 88%  neutrophil.  The culture was negative.  Her sed rate was 79.  C-reactive protein 10.6.  CBC was normal.  BMP normal.     MRI of the right hip joint was reviewed which showed synovitis and effusion and possible polycystic ovary.  MRI of the left lumbar spine was unremarkable.  I obtained x-ray of bilateral hands 2 views today, they were within normal without any joint space narrowing or erosive changes.  Bilateral feet x-rays were also unremarkable without any joint space narrowing or erosive changes.  Bilateral knee joint x-rays showed bilateral small fabella.  No chondrocalcinosis, no joint space narrowing.  Ferguson view pelvis showed right SI joint sclerosis.     Imaging: No results found.  Speciality Comments: No specialty comments available.    Procedures:  No procedures performed Allergies: Patient has no known allergies.   Assessment / Plan:     Visit Diagnoses: Ankylosing spondylitis of multiple sites in spine ([facility]) - +HLA B27, elevated ESR, CRP, positive synovitis and effusion and right hip joint, right SI joint sclerosis. Patient just reports intermittent discomfort in her knee joints and SI joints. She had no tenderness warmth or effusion today on examination.  Sacroiliitis (  [facility]): Not very symptomatic.  Iritis: She reports recurrent episodes of iritis in the last episode was 2 months ago.  High risk medication use - Off Humira due to the cost for 1 year. Started Humira in November 2016 and according to patient she took it for about 1 year. I believe patient did not keep on to get Humira and did not come for follow-up visits after initial 2 visits. We had detailed discussion regarding indications side effects contraindications of Humira. Handout was given consent was taken. I will obtain CBC CMP and TB gold today. Was we have labs available she can come in for her first Humira injection.  Medication counseling:   TB Test: Pending Chest x-ray: 2016 normal  Does patient have  diagnosis of heart failure?  No  Counseled patient that Humira is a TNF blocking agent.  Reviewed Humira dose of 40 mg every other week.  Counseled patient on purpose, proper use, and adverse effects of Humira.  Reviewed the most common adverse effects including infections, headache, and injection site reactions. Discussed that there is the possibility of an increased risk of malignancy but it is not well understood if this increased risk is due to the medication or the disease state.  Advised patient to get yearly dermatology exams due to risk of skin cancer.  Reviewed the importance of regular labs while on Humira therapy.  Advised patient to get standing labs one month after starting Humira then every 3 months.  Provided patient with standing lab orders.  Counseled patient that Humira should be held prior to scheduled surgery.  Counseled patient to avoid live vaccines while on Humira.  Advised patient to get annual influenza vaccine and the pneumococcal vaccine as needed.  Provided patient with medication education material and answered all questions.  Patient voiced understanding.  Patient consented to Humira.  Will upload consent into the media tab.  Reviewed storage instructions of Humira.  Advised initial injection must be administered in office.    Patient voiced understanding.     History of panic attacks: She is currently on no medications.  Essential hypertension: Her systolic and diastolic blood pressure more quite high. She states her blood pressure always stays high. I will advised to contact her PCP and started on some medication.  History of cholecystectomy  Ex-smoker    Orders: No orders of the defined types were placed in this encounter.  No orders of the defined types were placed in this encounter.   Face-to-face time spent with patient was 45 minutes. Greater than 50% of time was spent in counseling and coordination of care.  Follow-Up Instructions: Return for Ankylosing  spondylitis, iritis.   Bo Merino, MD  Note - This record has been created using Editor, commissioning.  Chart creation errors have been sought, but may not always  have been located. Such creation errors do not reflect on  the standard of medical care.

## 2017-08-20 ENCOUNTER — Ambulatory Visit (INDEPENDENT_AMBULATORY_CARE_PROVIDER_SITE_OTHER): Payer: 59 | Admitting: Rheumatology

## 2017-08-20 ENCOUNTER — Encounter: Payer: Self-pay | Admitting: Rheumatology

## 2017-08-20 ENCOUNTER — Telehealth: Payer: Self-pay | Admitting: Rheumatology

## 2017-08-20 ENCOUNTER — Telehealth: Payer: Self-pay

## 2017-08-20 VITALS — BP 142/104 | HR 83 | Ht 63.0 in | Wt 176.0 lb

## 2017-08-20 DIAGNOSIS — Z9049 Acquired absence of other specified parts of digestive tract: Secondary | ICD-10-CM | POA: Insufficient documentation

## 2017-08-20 DIAGNOSIS — Z8659 Personal history of other mental and behavioral disorders: Secondary | ICD-10-CM | POA: Diagnosis not present

## 2017-08-20 DIAGNOSIS — M450 Ankylosing spondylitis of multiple sites in spine: Secondary | ICD-10-CM | POA: Diagnosis not present

## 2017-08-20 DIAGNOSIS — H209 Unspecified iridocyclitis: Secondary | ICD-10-CM

## 2017-08-20 DIAGNOSIS — M461 Sacroiliitis, not elsewhere classified: Secondary | ICD-10-CM | POA: Diagnosis not present

## 2017-08-20 DIAGNOSIS — I10 Essential (primary) hypertension: Secondary | ICD-10-CM

## 2017-08-20 DIAGNOSIS — Z79899 Other long term (current) drug therapy: Secondary | ICD-10-CM

## 2017-08-20 DIAGNOSIS — Z87891 Personal history of nicotine dependence: Secondary | ICD-10-CM | POA: Diagnosis not present

## 2017-08-20 NOTE — Telephone Encounter (Signed)
Patient returning your call.

## 2017-08-20 NOTE — Addendum Note (Signed)
Addended by: Earnestine Mealing on: 08/20/2017 09:21 AM   Modules accepted: Orders

## 2017-08-20 NOTE — Telephone Encounter (Signed)
Attempted to contact the patient and left message for patient to call the office.  

## 2017-08-20 NOTE — Telephone Encounter (Signed)
A prior authorization for Humira 40mg /0.87ml was submitted to patients insurance via cover my meds.   Received a confirmation from cover my meds regarding a prior authorization approval for Humira 40mg /0.28ml through 08/20/2018.   Reference number: [id number] Phone number: [phone]  Will send document to scan center.  Patient was given co-pay card at visit to use at the pharmacy. She will need to be notified of the approval, have an Rx sent to her specialty pharmacy and schedule a nursing visit for her first injection. Thanks!  Travarius Lange, [place], CPhT 9:54 AM

## 2017-08-20 NOTE — Patient Instructions (Signed)
Adalimumab Injection What is this medicine? ADALIMUMAB (a dal AYE mu mab) is used to treat rheumatoid and psoriatic arthritis. It is also used to treat ankylosing spondylitis, Crohn's disease, ulcerative colitis, plaque psoriasis, hidradenitis suppurativa, and uveitis. This medicine may be used for other purposes; ask your health care provider or pharmacist if you have questions. COMMON BRAND NAME(S): CYLTEZO, Humira What should I tell my health care provider before I take this medicine? They need to know if you have any of these conditions: -diabetes -heart disease -hepatitis B or history of hepatitis B infection -immune system problems -infection or history of infections -multiple sclerosis -recently received or scheduled to receive a vaccine -scheduled to have surgery -tuberculosis, a positive skin test for tuberculosis or have recently been in close contact with someone who has tuberculosis -an unusual reaction to adalimumab, other medicines, mannitol, latex, rubber, foods, dyes, or preservatives -pregnant or trying to get pregnant -breast-feeding How should I use this medicine? This medicine is for injection under the skin. You will be taught how to prepare and give this medicine. Use exactly as directed. Take your medicine at regular intervals. Do not take your medicine more often than directed. A special MedGuide will be given to you by the pharmacist with each prescription and refill. Be sure to read this information carefully each time. It is important that you put your used needles and syringes in a special sharps container. Do not put them in a trash can. If you do not have a sharps container, call your pharmacist or healthcare provider to get one. Talk to your pediatrician regarding the use of this medicine in children. While this drug may be prescribed for children as young as 2 years for selected conditions, precautions do apply. The manufacturer of the medicine offers free  information to patients and their health care partners. Call 1-800-448-6472 for more information. Overdosage: If you think you have taken too much of this medicine contact a poison control center or emergency room at once. NOTE: This medicine is only for you. Do not share this medicine with others. What if I miss a dose? If you miss a dose, take it as soon as you can. If it is almost time for your next dose, take only that dose. Do not take double or extra doses. Give the next dose when your next scheduled dose is due. Call your doctor or health care professional if you are not sure how to handle a missed dose. What may interact with this medicine? Do not take this medicine with any of the following medications: -abatacept -anakinra -etanercept -infliximab -live virus vaccines -rilonacept This medicine may also interact with the following medications: -vaccines This list may not describe all possible interactions. Give your health care provider a list of all the medicines, herbs, non-prescription drugs, or dietary supplements you use. Also tell them if you smoke, drink alcohol, or use illegal drugs. Some items may interact with your medicine. What should I watch for while using this medicine? Visit your doctor or health care professional for regular checks on your progress. Tell your doctor or healthcare professional if your symptoms do not start to get better or if they get worse. You will be tested for tuberculosis (TB) before you start this medicine. If your doctor prescribes any medicine for TB, you should start taking the TB medicine before starting this medicine. Make sure to finish the full course of TB medicine. Call your doctor or health care professional if you get a cold   or other infection while receiving this medicine. Do not treat yourself. This medicine may decrease your body's ability to fight infection. Talk to your doctor about your risk of cancer. You may be more at risk for  certain types of cancers if you take this medicine. What side effects may I notice from receiving this medicine? Side effects that you should report to your doctor or health care professional as soon as possible: -allergic reactions like skin rash, itching or hives, swelling of the face, lips, or tongue -breathing problems -changes in vision -chest pain -fever, chills, or any other sign of infection -numbness or tingling -red, scaly patches or raised bumps on the skin -swelling of the ankles -swollen lymph nodes in the neck, underarm, or groin areas -unexplained weight loss -unusual bleeding or bruising -unusually weak or tired Side effects that usually do not require medical attention (report to your doctor or health care professional if they continue or are bothersome): -headache -nausea -redness, itching, swelling, or bruising at site where injected This list may not describe all possible side effects. Call your doctor for medical advice about side effects. You may report side effects to FDA at 1-800-FDA-1088. Where should I keep my medicine? Keep out of the reach of children. Store in the original container and in the refrigerator between 2 and 8 degrees C (36 and 46 degrees F). Do not freeze. The product may be stored in a cool carrier with an ice pack, if needed. Protect from light. Throw away any unused medicine after the expiration date. NOTE: This sheet is a summary. It may not cover all possible information. If you have questions about this medicine, talk to your doctor, pharmacist, or health care provider.  2018 Elsevier/Gold Standard (2015-04-25 11:11:43)  

## 2017-08-21 NOTE — Telephone Encounter (Signed)
Patient uses [facility].  Advised patient the prescription will be sent to the pharmacy once her labs are received.

## 2017-08-21 NOTE — Telephone Encounter (Signed)
Attempted to contact the patient and left message for patient to call the office.  

## 2017-08-21 NOTE — Telephone Encounter (Signed)
See previous phone note.  

## 2017-08-22 LAB — QUANTIFERON TB GOLD ASSAY (BLOOD)
QUANTIFERON(R)-TB GOLD: NEGATIVE
Quantiferon Nil Value: 0.03 IU/mL
Quantiferon Tb Ag Minus Nil Value: 0.14 IU/mL

## 2017-08-22 LAB — CBC WITH DIFFERENTIAL/PLATELET
BASOS ABS: 30 {cells}/uL (ref 0–200)
Basophils Relative: 0.6 %
EOS ABS: 100 {cells}/uL (ref 15–500)
EOS PCT: 2 %
HEMATOCRIT: 37.4 % (ref 35.0–45.0)
HEMOGLOBIN: 12.3 g/dL (ref 11.7–15.5)
LYMPHS ABS: 1680 {cells}/uL (ref 850–3900)
MCH: 29.1 pg (ref 27.0–33.0)
MCHC: 32.9 g/dL (ref 32.0–36.0)
MCV: 88.4 fL (ref 80.0–100.0)
MPV: 9.1 fL (ref 7.5–12.5)
Monocytes Relative: 5.8 %
NEUTROS ABS: 2900 {cells}/uL (ref 1500–7800)
NEUTROS PCT: 58 %
Platelets: 501 10*3/uL — ABNORMAL HIGH (ref 140–400)
RBC: 4.23 10*6/uL (ref 3.80–5.10)
RDW: 13.5 % (ref 11.0–15.0)
Total Lymphocyte: 33.6 %
WBC: 5 10*3/uL (ref 3.8–10.8)
WBCMIX: 290 {cells}/uL (ref 200–950)

## 2017-08-22 LAB — COMPLETE METABOLIC PANEL WITH GFR
AG RATIO: 1.4 (calc) (ref 1.0–2.5)
ALBUMIN MSPROF: 4.5 g/dL (ref 3.6–5.1)
ALKALINE PHOSPHATASE (APISO): 83 U/L (ref 33–115)
ALT: 10 U/L (ref 6–29)
AST: 14 U/L (ref 10–30)
BILIRUBIN TOTAL: 0.5 mg/dL (ref 0.2–1.2)
BUN: 8 mg/dL (ref 7–25)
CHLORIDE: 103 mmol/L (ref 98–110)
CO2: 29 mmol/L (ref 20–32)
Calcium: 9.9 mg/dL (ref 8.6–10.2)
Creat: 0.82 mg/dL (ref 0.50–1.10)
GFR, EST AFRICAN AMERICAN: 110 mL/min/{1.73_m2} (ref >=60)
GFR, Est Non African American: 95 mL/min/{1.73_m2} (ref >=60)
GLOBULIN: 3.3 g/dL (ref 1.9–3.7)
Glucose, Bld: 97 mg/dL (ref 65–99)
Potassium: 4.2 mmol/L (ref 3.5–5.3)
SODIUM: 140 mmol/L (ref 135–146)
Total Protein: 7.8 g/dL (ref 6.1–8.1)

## 2017-08-23 NOTE — Progress Notes (Signed)
WNL

## 2017-08-25 ENCOUNTER — Telehealth: Payer: Self-pay | Admitting: *Deleted

## 2017-08-25 MED ORDER — ADALIMUMAB 40 MG/0.4ML ~~LOC~~ PSKT: 40.0000 mg | PREFILLED_SYRINGE | SUBCUTANEOUS | 0 refills | Status: DC

## 2017-08-25 NOTE — Telephone Encounter (Signed)
-----   Message from Bo Merino, MD sent at 08/23/2017  8:10 PM EST ----- WNL

## 2017-10-04 ENCOUNTER — Other Ambulatory Visit: Payer: Self-pay

## 2017-10-04 ENCOUNTER — Emergency Department (HOSPITAL_COMMUNITY)
Admission: EM | Admit: 2017-10-04 | Discharge: 2017-10-05 | Disposition: A | Discharge status: Home / Self Care | Payer: 59 | Attending: Emergency Medicine | Admitting: Emergency Medicine

## 2017-10-04 ENCOUNTER — Emergency Department (HOSPITAL_COMMUNITY): Payer: 59

## 2017-10-04 ENCOUNTER — Encounter (HOSPITAL_COMMUNITY): Payer: Self-pay | Admitting: Emergency Medicine

## 2017-10-04 DIAGNOSIS — R079 Chest pain, unspecified: Secondary | ICD-10-CM | POA: Insufficient documentation

## 2017-10-04 DIAGNOSIS — I10 Essential (primary) hypertension: Secondary | ICD-10-CM | POA: Diagnosis not present

## 2017-10-04 DIAGNOSIS — Z79899 Other long term (current) drug therapy: Secondary | ICD-10-CM | POA: Insufficient documentation

## 2017-10-04 DIAGNOSIS — R0789 Other chest pain: Secondary | ICD-10-CM | POA: Diagnosis not present

## 2017-10-04 DIAGNOSIS — Z87891 Personal history of nicotine dependence: Secondary | ICD-10-CM | POA: Diagnosis not present

## 2017-10-04 HISTORY — DX: Essential (primary) hypertension: I10

## 2017-10-04 LAB — BASIC METABOLIC PANEL
ANION GAP: 9 (ref 5–15)
BUN: 9 mg/dL (ref 6–20)
CO2: 21 mmol/L — ABNORMAL LOW (ref 22–32)
Calcium: 9.2 mg/dL (ref 8.9–10.3)
Chloride: 106 mmol/L (ref 101–111)
Creatinine, Ser: 0.7 mg/dL (ref 0.44–1.00)
GFR calc Af Amer: >60 mL/min (ref >60)
Glucose, Bld: 91 mg/dL (ref 65–99)
POTASSIUM: 3.3 mmol/L — AB (ref 3.5–5.1)
SODIUM: 136 mmol/L (ref 135–145)

## 2017-10-04 LAB — I-STAT BETA HCG BLOOD, ED (MC, WL, AP ONLY)

## 2017-10-04 LAB — CBC
HEMATOCRIT: 35.9 % — AB (ref 36.0–46.0)
HEMOGLOBIN: 12.1 g/dL (ref 12.0–15.0)
MCH: 29.5 pg (ref 26.0–34.0)
MCHC: 33.7 g/dL (ref 30.0–36.0)
MCV: 87.6 fL (ref 78.0–100.0)
Platelets: 424 10*3/uL — ABNORMAL HIGH (ref 150–400)
RBC: 4.1 MIL/uL (ref 3.87–5.11)
RDW: 13.9 % (ref 11.5–15.5)
WBC: 6.8 10*3/uL (ref 4.0–10.5)

## 2017-10-04 LAB — I-STAT TROPONIN, ED: Troponin i, poc: 0 ng/mL (ref 0.00–0.08)

## 2017-10-04 NOTE — ED Triage Notes (Signed)
Pt began to experience left sided chest pain that radiated down her left arm.  Pt denies any SOB, n/v/d, dizziness.  Reports sometimes hurts more w/ deep breath.

## 2017-10-04 NOTE — ED Provider Notes (Signed)
[facility] EMERGENCY DEPARTMENT Provider Note   CSN: [id number] Arrival date & time: 10/04/17  1958     History   Chief Complaint Chief Complaint  Patient presents with  . Chest Pain    HPI Krystalynn Ridgeway is a 31 y.o. female.  The history is provided by the patient and medical records.  Chest Pain      31 year old female with history of arthritis, borderline hypertension not currently on medications, history of HSV, presenting to the ED with left-sided chest pain.  Reports onset this morning around 9 AM and has been persistent all day.  Pain on the left side of her chest described as a dull, aching sensation.  She does endorse some radiation of the pain into the left arm but denies any numbness, weakness, tingling.  She has not had any dizziness, diaphoresis, nausea, or vomiting.  No fever, chills, cough, or other URI symptoms.  She denies any shortness of breath, but does notice some pain with deep breathing.  She denies any known cardiac history.  No history of DVT or PE.  She does currently have Mirena in place.  No medications tried for symptoms prior to arrival.  Of note, patient hypertensive here.  Mother states her former physician was "monitoring this closely" but did not fully diagnosed her with hypertension and did not start on any medications.  Past Medical History:  Diagnosis Date  . Arthritis   . HSV (herpes simplex virus) infection   . Hypertension   . Infection    Valtrex for HSV 2009   . Urinary tract infection     Patient Active Problem List   Diagnosis Date Noted  . Ankylosing spondylitis of multiple sites in spine ([place]) 08/20/2017  . Sacroiliitis ([place]) 08/20/2017  . Iritis 08/20/2017  . History of panic attacks 08/20/2017  . Essential hypertension 08/20/2017  . History of cholecystectomy 08/20/2017  . Ex-smoker 08/20/2017  . Cholecystitis, acute 10/14/2012    Past Surgical History:  Procedure Laterality Date  .  CHOLECYSTECTOMY  10/15/2012   Procedure: LAPAROSCOPIC CHOLECYSTECTOMY WITH INTRAOPERATIVE CHOLANGIOGRAM;  Surgeon: Shann Medal, MD;  Location: [facility];  Service: General;  Laterality: N/A;    OB History    Gravida Para Term Preterm AB Living   4 3 3  0 1 3   SAB TAB Ectopic Multiple Live Births   1 0 0 0 1       Home Medications    Prior to Admission medications   Medication Sig Start Date End Date Taking? Authorizing Provider  Adalimumab (HUMIRA) 40 MG/0.4ML PSKT Inject 40 mg every 14 (fourteen) days into the skin. 08/25/17   Bo Merino, MD  indomethacin (INDOCIN) 25 MG capsule Take 1 capsule (25 mg total) by mouth 3 (three) times daily as needed. Patient not taking: Reported on 08/20/2017 06/03/15   Carmin Muskrat, MD  levonorgestrel [facility]) 20 MCG/24HR IUD 1 each by Intrauterine route once.    [provider]  oxyCODONE-acetaminophen (PERCOCET) 5-325 MG per tablet Take 1-2 tablets by mouth every 6 (six) hours as needed for severe pain. Patient not taking: Reported on 08/20/2017 06/03/15   Carmin Muskrat, MD  valACYclovir (VALTREX) 500 MG tablet Take 500 mg by mouth daily as needed (break out).    [provider]    Family History Family History  Problem Relation Age of Onset  . Hypertension Mother   . Hypertension Maternal Aunt   . Hypertension Maternal Uncle   . Cancer Maternal  Grandmother   . Hypertension Maternal Grandmother   . Hypertension Maternal Grandfather   . Hypertension Paternal Grandmother   . Stroke Paternal Grandmother   . Hypertension Paternal Grandfather   . Stroke Paternal Grandfather   . Hypertension Father     Social History Social History   Tobacco Use  . Smoking status: Former Smoker    Years: 2.00    Last attempt to quit: 12/16/2011    Years since quitting: 5.8  . Smokeless tobacco: Never Used  Substance Use Topics  . Alcohol use: Yes    Comment: occassionally  . Drug use: No     Allergies   Patient has no  known allergies.   Review of Systems Review of Systems  Cardiovascular: Positive for chest pain.  All other systems reviewed and are negative.    Physical Exam Updated Vital Signs BP (!) 151/108   Pulse 64   Temp 98.1 F (36.7 C) (Oral)   Resp 16   Ht 5\' 3"  (1.6 m)   Wt 79.4 kg (175 lb)   SpO2 100%   BMI 31.00 kg/m   Physical Exam  Constitutional: She is oriented to person, place, and time. She appears well-developed and well-nourished.  HENT:  Head: Normocephalic and atraumatic.  Mouth/Throat: Oropharynx is clear and moist.  Eyes: Conjunctivae and EOM are normal. Pupils are equal, round, and reactive to light.  Neck: Normal range of motion.  Cardiovascular: Normal rate, regular rhythm and normal heart sounds.  Pulmonary/Chest: Effort normal and breath sounds normal. No accessory muscle usage. No tachypnea. She has no decreased breath sounds.  Chest wall non-tender  Abdominal: Soft. Bowel sounds are normal.  Musculoskeletal: Normal range of motion.  Neurological: She is alert and oriented to person, place, and time.  Skin: Skin is warm and dry.  Psychiatric: She has a normal mood and affect.  Nursing note and vitals reviewed.    ED Treatments / Results  Labs (all labs ordered are listed, but only abnormal results are displayed) Labs Reviewed  BASIC METABOLIC PANEL - Abnormal; Notable for the following components:      Result Value   Potassium 3.3 (*)    CO2 21 (*)    All other components within normal limits  CBC - Abnormal; Notable for the following components:   HCT 35.9 (*)    Platelets 424 (*)    All other components within normal limits  D-DIMER, QUANTITATIVE (NOT AT [facility])  I-STAT TROPONIN, ED  I-STAT BETA HCG BLOOD, ED ([facility], [facility], [facility] ONLY)  I-STAT TROPONIN, ED    EKG  EKG Interpretation None       Radiology Dg Chest 2 View  Result Date: 10/04/2017 CLINICAL DATA:  Chest pain EXAM: CHEST  2 VIEW COMPARISON:  September 18, 2015 FINDINGS: There is  no edema or consolidation. The heart size and pulmonary vascularity are normal. No adenopathy. No evident bone lesions. Monitor leads are present over each upper lung zone. IMPRESSION: No edema or consolidation. Electronically Signed   By: Lowella Grip III M.D.   On: 10/04/2017 20:57    Procedures Procedures (including critical care time)  Medications Ordered in ED Medications - No data to display   Initial Impression / Assessment and Plan / ED Course  I have reviewed the triage vital signs and the nursing notes.  Pertinent labs & imaging results that were available during my care of the patient were reviewed by me and considered in my medical decision making (see chart for details).  31 year old female here with chest pain.  Onset 9 AM left upper chest.  Some radiation into the left arm.  EKG is nonischemic.  Screening lab work obtained from triage is overall reassuring, troponin negative.  Chest x-ray is clear.  Patient does endorse some pain with deep inspiration.  Does have Mirena in place.  No history of DVT or PE.  She is somewhat hypertensive as well, states she was told she was "borderline" in the past but is never been started on medication for this.  Will send Dr. troponin, and d-dimer.  If negative anticipate discharge.  Repeat troponin and d-dimer are both negative.  Patient's blood pressure has trended down without acute intervention here.  No signs of endorgan damage from her hypertension.  At this point given negative evaluation low suspicion for ACS, PE, dissection, acute cardiac event.  Recommended close PCP follow-up for recheck of chest pain as well as close monitoring of blood pressure.  Discussed plan with patient, she acknowledged understanding and agreed with plan of care.  Return precautions given for new or worsening symptoms.  Final Clinical Impressions(s) / ED Diagnoses   Final diagnoses:  Chest pain, unspecified type    ED Discharge Orders    None         Larene Pickett, PA-C 10/05/17 0239    Jola Schmidt, MD 10/05/17 [phone]

## 2017-10-05 LAB — I-STAT TROPONIN, ED: TROPONIN I, POC: 0 ng/mL (ref 0.00–0.08)

## 2017-10-05 LAB — D-DIMER, QUANTITATIVE (NOT AT ARMC): D DIMER QUANT: 0.41 ug{FEU}/mL (ref 0.00–0.50)

## 2017-10-05 NOTE — Discharge Instructions (Signed)
Work-up today did not show any acute cardiac or pulmonary abnormalities. Recommend close follow-up with your primary care doctor-- specifically need to monitor your blood pressure. I have attached copies of your lab tests on back for doctor review. Return to the ED for new or worsening symptoms.

## 2017-10-09 ENCOUNTER — Ambulatory Visit (INDEPENDENT_AMBULATORY_CARE_PROVIDER_SITE_OTHER): Payer: 59 | Admitting: Family Medicine

## 2017-10-09 ENCOUNTER — Encounter: Payer: Self-pay | Admitting: Family Medicine

## 2017-10-09 VITALS — BP 150/90 | HR 86 | Temp 98.3°F | Wt 176.1 lb

## 2017-10-09 DIAGNOSIS — Z7689 Persons encountering health services in other specified circumstances: Secondary | ICD-10-CM

## 2017-10-09 DIAGNOSIS — E876 Hypokalemia: Secondary | ICD-10-CM | POA: Diagnosis not present

## 2017-10-09 DIAGNOSIS — M450 Ankylosing spondylitis of multiple sites in spine: Secondary | ICD-10-CM

## 2017-10-09 DIAGNOSIS — I10 Essential (primary) hypertension: Secondary | ICD-10-CM | POA: Diagnosis not present

## 2017-10-09 MED ORDER — AMLODIPINE BESYLATE 5 MG PO TABS: 5.0000 mg | ORAL_TABLET | Freq: Every day | ORAL | 3 refills | Status: DC

## 2017-10-09 MED ORDER — POTASSIUM CHLORIDE CRYS ER 20 MEQ PO TBCR: 20.0000 meq | EXTENDED_RELEASE_TABLET | Freq: Every day | ORAL | 0 refills | Status: DC

## 2017-10-09 NOTE — Progress Notes (Signed)
Patient presents to clinic today to discuss chronic issues and establish care.  Pt Is accompanied by her mother.  SUBJECTIVE: PMH:  Pt is a 31 yo with pmh sig for HTN, ankylosing spondylitis of spine, sacroilitis, panic attacks.  She was formerly seen in town but had concerns about her health she feels were not addressed.  Pt had a recent ED visit 12/16 for CP which has since resolved.  F/u with a pcp was advised.   HTN: -ongoing concern x 1 yr -pt displeased that her previous provider did not start her on meds -Pt had recent ED visit 12/16 for chest pain, bp was 151/108. -Pt is not currently checking her bp at home. -She is open to the idea of medication -Pt denies HA, neck pain, changes in vision   Ankylosing spondylitis of spine: -Dx'd 3 yrs ago -followed by Dr. Dora Sims, Rheumatology -pt formerly on Humira, to restart soon -currently stable -also has a h/o iritis  Allergies:  NKDA  P Surg Hx: Cholecystectomy 2013  Social Hx: Pt is married.  She has 3 kids, ages 41, 68, and 64.  She works in Therapist, art.  Pt is a former smoker, she quit smoking black and milds in 11/2011.  Pt endorses drinking EtOH.  She denies drug use.  LMP 10/05/17.  Family medical hx: Mom-alive, HLD, HTN Dad-alive, HLD, HTN MGM-desc, breast cancer, HTN PGM- desc, lung ca, smoker, HTN   Health Maintenance: Dental --[employer] [facility] --Dr. Ellie Lunch PAP -- 2014  Past Medical History:  Diagnosis Date  . Arthritis   . HSV (herpes simplex virus) infection   . Hypertension   . Infection    Valtrex for HSV 2009   . Urinary tract infection     Past Surgical History:  Procedure Laterality Date  . CHOLECYSTECTOMY  10/15/2012   Procedure: LAPAROSCOPIC CHOLECYSTECTOMY WITH INTRAOPERATIVE CHOLANGIOGRAM;  Surgeon: Shann Medal, MD;  Location: [facility];  Service: General;  Laterality: N/A;    Current Outpatient Medications on File Prior to Visit  Medication Sig Dispense Refill  .  levonorgestrel (MIRENA) 20 MCG/24HR IUD 1 each by Intrauterine route once.    . Adalimumab (HUMIRA) 40 MG/0.4ML PSKT Inject 40 mg every 14 (fourteen) days into the skin. (Patient not taking: Reported on 10/09/2017) 3 each 0   No current facility-administered medications on file prior to visit.     No Known Allergies  Family History  Problem Relation Age of Onset  . Hypertension Mother   . Hypertension Maternal Aunt   . Hypertension Maternal Uncle   . Cancer Maternal Grandmother   . Hypertension Maternal Grandmother   . Hypertension Maternal Grandfather   . Hypertension Paternal Grandmother   . Stroke Paternal Grandmother   . Hypertension Paternal Grandfather   . Stroke Paternal Grandfather   . Hypertension Father     Social History   Socioeconomic History  . Marital status: Married    Spouse name: Not on file  . Number of children: Not on file  . Years of education: Not on file  . Highest education level: Not on file  Social Needs  . Financial resource strain: Not on file  . Food insecurity - worry: Not on file  . Food insecurity - inability: Not on file  . Transportation needs - medical: Not on file  . Transportation needs - non-medical: Not on file  Occupational History  . Not on file  Tobacco Use  . Smoking status: Former Smoker  Years: 2.00    Last attempt to quit: 12/16/2011    Years since quitting: 5.8  . Smokeless tobacco: Never Used  Substance and Sexual Activity  . Alcohol use: Yes    Comment: occassionally  . Drug use: No  . Sexual activity: Yes    Birth control/protection: IUD  Other Topics Concern  . Not on file  Social History Narrative  . Not on file    ROS General: Denies fever, chills, night sweats, changes in weight, changes in appetite HEENT: Denies headaches, ear pain, changes in vision, rhinorrhea, sore throat CV: Denies CP, palpitations, SOB, orthopnea Pulm: Denies SOB, cough, wheezing GI: Denies abdominal pain, nausea, vomiting,  diarrhea, constipation GU: Denies dysuria, hematuria, frequency, vaginal discharge Msk: Denies muscle cramps   +joint pain Neuro: Denies weakness, numbness, tingling Skin: Denies rashes, bruising Psych: Denies depression, anxiety, hallucinations  +h/o panic attacks  BP (!) 150/90 (BP Location: Right Arm, Patient Position: Sitting, Cuff Size: Large)   Pulse 86   Temp 98.3 F (36.8 C) (Oral)   Wt 176 lb 1.6 oz (79.9 kg)   BMI 31.19 kg/m   Physical Exam Gen. Pleasant, well developed, well-nourished, in NAD HEENT - [place]/AT, PERRL, no scleral icterus, no nasal drainage, pharynx without erythema or exudate. Neck: No JVD, no thyromegaly Lungs: no use of accessory muscles, CTAB, no wheezes, rales or rhonchi Cardiovascular: RRR, No r/g/m, no peripheral edema Abdomen: BS present, soft, nontender,nondistended Musculoskeletal: No deformities, moves all four extremities, no cyanosis or clubbing, normal tone Neuro:  A&Ox3, CN II-XII intact, normal gait Skin:  Warm, dry, intact, no lesions Psych: normal affect, mood appropriate  Recent Results (from the past 2160 hour(s))  COMPLETE METABOLIC PANEL WITH GFR     Status: None   Collection Time: 08/20/17  9:15 AM  Result Value Ref Range   Glucose, Bld 97 65 - 99 mg/dL    Comment: .            Fasting reference interval .    BUN 8 7 - 25 mg/dL   Creat 0.82 0.50 - 1.10 mg/dL   GFR, Est Non African American 95 > OR = 60 mL/min/1.66m   GFR, Est African American 110 > OR = 60 mL/min/1.762m  BUN/Creatinine Ratio NOT APPLICABLE 6 - 22 (calc)   Sodium 140 135 - 146 mmol/L   Potassium 4.2 3.5 - 5.3 mmol/L   Chloride 103 98 - 110 mmol/L   CO2 29 20 - 32 mmol/L   Calcium 9.9 8.6 - 10.2 mg/dL   Total Protein 7.8 6.1 - 8.1 g/dL   Albumin 4.5 3.6 - 5.1 g/dL   Globulin 3.3 1.9 - 3.7 g/dL (calc)   AG Ratio 1.4 1.0 - 2.5 (calc)   Total Bilirubin 0.5 0.2 - 1.2 mg/dL   Alkaline phosphatase (APISO) 83 33 - 115 U/L   AST 14 10 - 30 U/L   ALT 10 6 - 29  U/L  CBC with Differential/Platelet     Status: Abnormal   Collection Time: 08/20/17  9:15 AM  Result Value Ref Range   WBC 5.0 3.8 - 10.8 Thousand/uL   RBC 4.23 3.80 - 5.10 Million/uL   Hemoglobin 12.3 11.7 - 15.5 g/dL   HCT 37.4 35.0 - 45.0 %   MCV 88.4 80.0 - 100.0 fL   MCH 29.1 27.0 - 33.0 pg   MCHC 32.9 32.0 - 36.0 g/dL   RDW 13.5 11.0 - 15.0 %   Platelets 501 (H) 140 - 400  Thousand/uL   MPV 9.1 7.5 - 12.5 fL   Neutro Abs 2,900 1,500 - 7,800 cells/uL   Lymphs Abs 1,680 850 - 3,900 cells/uL   WBC mixed population 290 200 - 950 cells/uL   Eosinophils Absolute 100 15 - 500 cells/uL   Basophils Absolute 30 0 - 200 cells/uL   Neutrophils Relative % 58 %   Total Lymphocyte 33.6 %   Monocytes Relative 5.8 %   Eosinophils Relative 2.0 %   Basophils Relative 0.6 %  Quantiferon tb gold assay (blood)     Status: None   Collection Time: 08/20/17  9:15 AM  Result Value Ref Range   QUANTIFERON(R)-TB GOLD NEGATIVE NEGATIVE    Comment: Negative test result. M. tuberculosis complex  infection unlikely.    Quantiferon Nil Value 0.03 IU/mL   Mitogen-Nil >10.00 IU/mL   Quantiferon Tb Ag Minus Nil Value 0.14 IU/mL    Comment: The Nil tube value is used to determine if the patient has a preexisting immune response which could cause a  false-positive reading on the test. In order for a  test to be valid, the Nil tube must have a value of  less than or equal to 8.0 IU/mL. Marland Kitchen The mitogen control tube is used to assure the patient  has a healthy immune status and also serves as a  control for correct blood handling and incubation. It  is used to detect false-negative readings. The mitogen  tube must have a gamma interferon value of greater  than or equal to 0.5 IU/mL higher than the value of  the Nil tube. . The TB antigen tube is coated with the M. tuberculosis specific antigens. For a test to be considered  positive, the TB antigen tube value minus the Nil tube  value must be greater  than or equal to 0.35 IU/mL. Marland Kitchen For additional information, please refer to  http://education.questdiagnostics.com/faq/QFT (This link is being provided for informational/ educational purposes only.) .   D-dimer, quantitative (not at [facility])     Status: None   Collection Time: 10/04/17 12:21 AM  Result Value Ref Range   D-Dimer, Quant 0.41 0.00 - 0.50 ug/mL-FEU    Comment: (NOTE) At the manufacturer cut-off of 0.50 ug/mL FEU, this assay has been documented to exclude PE with a sensitivity and negative predictive value of 97 to 99%.  At this time, this assay has not been approved by the FDA to exclude DVT/VTE. Results should be correlated with clinical presentation.   Basic metabolic panel     Status: Abnormal   Collection Time: 10/04/17  8:19 PM  Result Value Ref Range   Sodium 136 135 - 145 mmol/L   Potassium 3.3 (L) 3.5 - 5.1 mmol/L   Chloride 106 101 - 111 mmol/L   CO2 21 (L) 22 - 32 mmol/L   Glucose, Bld 91 65 - 99 mg/dL   BUN 9 6 - 20 mg/dL   Creatinine, Ser 0.70 0.44 - 1.00 mg/dL   Calcium 9.2 8.9 - 10.3 mg/dL   GFR calc non Af Amer >60 >60 mL/min   GFR calc Af Amer >60 >60 mL/min    Comment: (NOTE) The eGFR has been calculated using the CKD EPI equation. This calculation has not been validated in all clinical situations. eGFR's persistently <60 mL/min signify possible Chronic Kidney Disease.    Anion gap 9 5 - 15  CBC     Status: Abnormal   Collection Time: 10/04/17  8:19 PM  Result Value Ref Range  WBC 6.8 4.0 - 10.5 K/uL   RBC 4.10 3.87 - 5.11 MIL/uL   Hemoglobin 12.1 12.0 - 15.0 g/dL   HCT 35.9 (L) 36.0 - 46.0 %   MCV 87.6 78.0 - 100.0 fL   MCH 29.5 26.0 - 34.0 pg   MCHC 33.7 30.0 - 36.0 g/dL   RDW 13.9 11.5 - 15.5 %   Platelets 424 (H) 150 - 400 K/uL  I-stat troponin, ED     Status: None   Collection Time: 10/04/17  8:45 PM  Result Value Ref Range   Troponin i, poc 0.00 0.00 - 0.08 ng/mL   Comment 3            Comment: Due to the release kinetics of  cTnI, a negative result within the first hours of the onset of symptoms does not rule out myocardial infarction with certainty. If myocardial infarction is still suspected, repeat the test at appropriate intervals.   I-Stat beta hCG blood, ED     Status: None   Collection Time: 10/04/17  8:45 PM  Result Value Ref Range   I-stat hCG, quantitative <5.0 <5 mIU/mL   Comment 3            Comment:   GEST. AGE      CONC.  (mIU/mL)   <=1 WEEK        5 - 50     2 WEEKS       50 - 500     3 WEEKS       100 - 10,000     4 WEEKS     1,000 - 30,000        FEMALE AND NON-PREGNANT FEMALE:     LESS THAN 5 mIU/mL   I-stat troponin, ED     Status: None   Collection Time: 10/05/17 12:28 AM  Result Value Ref Range   Troponin i, poc 0.00 0.00 - 0.08 ng/mL   Comment 3            Comment: Due to the release kinetics of cTnI, a negative result within the first hours of the onset of symptoms does not rule out myocardial infarction with certainty. If myocardial infarction is still suspected, repeat the test at appropriate intervals.     Assessment/Plan: Essential hypertension  -Discussed lifestyle modifications including limiting sodium intake, increasing po intake of water, increasing physical activity -Pt to obtain bp cuff for home monitoring - Plan: amLODipine (NORVASC) 5 MG tablet -RTC or ED precautions given. -f/u in 1-2 wks  Ankylosing spondylitis of multiple sites in spine ([place]) -currently stable -pt encouraged to reach out to Rheumatologist in regards to re-start date for Humira.  Hypokalemia  -labs from ED visit reviewed.  K+ noted at 3.3 -will replete.  Plan to recheck at next [facility] in 1-2 wks. - Plan: potassium chloride SA (K-DUR,KLOR-CON) 20 MEQ tablet  Encounter to establish care -We reviewed the PMH, PSH, FH, SH, Meds and Allergies. -We provided refills for any medications we will prescribe as needed. -We addressed current concerns per orders and patient instructions. -We have  asked for records for pertinent exams, studies, vaccines and notes from previous providers. -We have advised patient to follow up per instructions below.     F/u in 1-2 wks for BP, sooner if needed.    Grier Mitts, MD

## 2017-10-09 NOTE — Patient Instructions (Addendum)
How to Take Your Blood Pressure  Blood pressure is a measurement of how strongly your blood is pressing against the walls of your arteries. Arteries are blood vessels that carry blood from your heart throughout your body. Your health care provider takes your blood pressure at each office visit. You can also take your own blood pressure at home with a blood pressure machine. You may need to take your own blood pressure:   To confirm a diagnosis of high blood pressure (hypertension).   To monitor your blood pressure over time.   To make sure your blood pressure medicine is working.    Supplies needed:  To take your blood pressure, you will need a blood pressure machine. You can buy a blood pressure machine, or blood pressure monitor, at most drugstores or online. There are several types of home blood pressure monitors. When choosing one, consider the following:   Choose a monitor that has an arm cuff.   Choose a monitor that wraps snugly around your upper arm. You should be able to fit only one finger between your arm and the cuff.   Do not choose a monitor that measures your blood pressure from your wrist or finger.    Your health care provider can suggest a reliable monitor that will meet your needs.  How to prepare  To get the most accurate reading, avoid the following for 30 minutes before you check your blood pressure:   Drinking caffeine.   Drinking alcohol.   Eating.   Smoking.   Exercising.    Five minutes before you check your blood pressure:   Empty your bladder.   Sit quietly without talking in a dining chair, rather than in a soft couch or armchair.    How to take your blood pressure  To check your blood pressure, follow the instructions in the manual that came with your blood pressure monitor. If you have a digital blood pressure monitor, the instructions may be as follows:  1. Sit up straight.  2. Place your feet on the floor. Do not cross your ankles or legs.  3. Rest your left arm at the  level of your heart on a table or desk or on the arm of a chair.  4. Pull up your shirt sleeve.  5. Wrap the blood pressure cuff around the upper part of your left arm, 1 inch (2.5 cm) above your elbow. It is best to wrap the cuff around bare skin.  6. Fit the cuff snugly around your arm. You should be able to place only one finger between the cuff and your arm.  7. Position the cord inside the groove of your elbow.  8. Press the power button.  9. Sit quietly while the cuff inflates and deflates.  10. Read the digital reading on the monitor screen and write it down (record it).  11. Wait 2-3 minutes, then repeat the steps, starting at step 1.    What does my blood pressure reading mean?  A blood pressure reading consists of a higher number over a lower number. Ideally, your blood pressure should be below 120/80. The first ("top") number is called the systolic pressure. It is a measure of the pressure in your arteries as your heart beats. The second ("bottom") number is called the diastolic pressure. It is a measure of the pressure in your arteries as the heart relaxes.  Blood pressure is classified into four stages. The following are the stages for adults who do   pressure: below 80. Hypertension stage 1  Systolic pressure: 161-096.  Diastolic pressure: 04-54. Hypertension stage 2  Systolic pressure: 098 or above.  Diastolic pressure: 90 or above. You can have prehypertension or hypertension even if only the systolic or only the diastolic number in your reading is higher than normal. Follow these instructions at home:  Check your blood pressure as often as recommended by your health care provider.  Take your monitor to the next appointment  with your health care provider to make sure: ? That you are using it correctly. ? That it provides accurate readings.  Be sure you understand what your goal blood pressure numbers are.  Tell your health care provider if you are having any side effects from blood pressure medicine. Contact a health care provider if:  Your blood pressure is consistently high. Get help right away if:  Your systolic blood pressure is higher than 180.  Your diastolic blood pressure is higher than 110. This information is not intended to replace advice given to you by your health care provider. Make sure you discuss any questions you have with your health care provider. Document Released: 03/14/2016 Document Revised: 05/27/2016 Document Reviewed: 03/14/2016 Elsevier Interactive Patient Education  2018 [employer].  Managing Your Hypertension Hypertension is commonly called high blood pressure. This is when the force of your blood pressing against the walls of your arteries is too strong. Arteries are blood vessels that carry blood from your heart throughout your body. Hypertension forces the heart to work harder to pump blood, and may cause the arteries to become narrow or stiff. Having untreated or uncontrolled hypertension can cause heart attack, stroke, kidney disease, and other problems. What are blood pressure readings? A blood pressure reading consists of a higher number over a lower number. Ideally, your blood pressure should be below 120/80. The first ("top") number is called the systolic pressure. It is a measure of the pressure in your arteries as your heart beats. The second ("bottom") number is called the diastolic pressure. It is a measure of the pressure in your arteries as the heart relaxes. What does my blood pressure reading mean? Blood pressure is classified into four stages. Based on your blood pressure reading, your health care provider may use the following stages to determine what type of  treatment you need, if any. Systolic pressure and diastolic pressure are measured in a unit called mm Hg. Normal  Systolic pressure: below 119.  Diastolic pressure: below 80. Elevated  Systolic pressure: 147-829.  Diastolic pressure: below 80. Hypertension stage 1  Systolic pressure: 562-130.  Diastolic pressure: 86-57. Hypertension stage 2  Systolic pressure: 846 or above.  Diastolic pressure: 90 or above. What health risks are associated with hypertension? Managing your hypertension is an important responsibility. Uncontrolled hypertension can lead to:  A heart attack.  A stroke.  A weakened blood vessel (aneurysm).  Heart failure.  Kidney damage.  Eye damage.  Metabolic syndrome.  Memory and concentration problems.  What changes can I make to manage my hypertension? Hypertension can be managed by making lifestyle changes and possibly by taking medicines. Your health care provider will help you make a plan to bring your blood pressure within a normal range. Eating and drinking  Eat a diet that is high in fiber and potassium, and low in salt (sodium), added sugar, and fat. An example eating plan is called the DASH (Dietary Approaches to Stop Hypertension) diet. To eat this way: ? Eat plenty of fresh fruits and  vegetables. Try to fill half of your plate at each meal with fruits and vegetables. ? Eat whole grains, such as whole wheat pasta, brown rice, or whole grain bread. Fill about one quarter of your plate with whole grains. ? Eat low-fat diary products. ? Avoid fatty cuts of meat, processed or cured meats, and poultry with skin. Fill about one quarter of your plate with lean proteins such as fish, chicken without skin, beans, eggs, and tofu. ? Avoid premade and processed foods. These tend to be higher in sodium, added sugar, and fat.  Reduce your daily sodium intake. Most people with hypertension should eat less than 1,500 mg of sodium a day.  Limit alcohol  intake to no more than 1 drink a day for nonpregnant women and 2 drinks a day for men. One drink equals 12 oz of beer, 5 oz of wine, or 1 oz of hard liquor. Lifestyle  Work with your health care provider to maintain a healthy body weight, or to lose weight. Ask what an ideal weight is for you.  Get at least 30 minutes of exercise that causes your heart to beat faster (aerobic exercise) most days of the week. Activities may include walking, swimming, or biking.  Include exercise to strengthen your muscles (resistance exercise), such as weight lifting, as part of your weekly exercise routine. Try to do these types of exercises for 30 minutes at least 3 days a week.  Do not use any products that contain nicotine or tobacco, such as cigarettes and e-cigarettes. If you need help quitting, ask your health care provider.  Control any long-term (chronic) conditions you have, such as high cholesterol or diabetes. Monitoring  Monitor your blood pressure at home as told by your health care provider. Your personal target blood pressure may vary depending on your medical conditions, your age, and other factors.  Have your blood pressure checked regularly, as often as told by your health care provider. Working with your health care provider  Review all the medicines you take with your health care provider because there may be side effects or interactions.  Talk with your health care provider about your diet, exercise habits, and other lifestyle factors that may be contributing to hypertension.  Visit your health care provider regularly. Your health care provider can help you create and adjust your plan for managing hypertension. Will I need medicine to control my blood pressure? Your health care provider may prescribe medicine if lifestyle changes are not enough to get your blood pressure under control, and if:  Your systolic blood pressure is 130 or higher.  Your diastolic blood pressure is 80 or  higher.  Take medicines only as told by your health care provider. Follow the directions carefully. Blood pressure medicines must be taken as prescribed. The medicine does not work as well when you skip doses. Skipping doses also puts you at risk for problems. Contact a health care provider if:  You think you are having a reaction to medicines you have taken.  You have repeated (recurrent) headaches.  You feel dizzy.  You have swelling in your ankles.  You have trouble with your vision. Get help right away if:  You develop a severe headache or confusion.  You have unusual weakness or numbness, or you feel faint.  You have severe pain in your chest or abdomen.  You vomit repeatedly.  You have trouble breathing. Summary  Hypertension is when the force of blood pumping through your arteries is too strong.  If this condition is not controlled, it may put you at risk for serious complications.  Your personal target blood pressure may vary depending on your medical conditions, your age, and other factors. For most people, a normal blood pressure is less than 120/80.  Hypertension is managed by lifestyle changes, medicines, or both. Lifestyle changes include weight loss, eating a healthy, low-sodium diet, exercising more, and limiting alcohol. This information is not intended to replace advice given to you by your health care provider. Make sure you discuss any questions you have with your health care provider. Document Released: 06/30/2012 Document Revised: 09/03/2016 Document Reviewed: 09/03/2016 Elsevier Interactive Patient Education  [employer].

## 2017-10-11 ENCOUNTER — Encounter: Payer: Self-pay | Admitting: Family Medicine

## 2017-10-19 ENCOUNTER — Other Ambulatory Visit: Payer: Self-pay | Admitting: *Deleted

## 2017-10-19 MED ORDER — ADALIMUMAB 40 MG/0.4ML ~~LOC~~ PSKT: 40.0000 mg | PREFILLED_SYRINGE | SUBCUTANEOUS | 0 refills | Status: DC

## 2017-10-23 ENCOUNTER — Encounter: Payer: Self-pay | Admitting: Family Medicine

## 2017-10-23 ENCOUNTER — Ambulatory Visit (INDEPENDENT_AMBULATORY_CARE_PROVIDER_SITE_OTHER): Payer: 59 | Admitting: Family Medicine

## 2017-10-23 VITALS — BP 122/82 | HR 103 | Temp 98.5°F | Wt 173.3 lb

## 2017-10-23 DIAGNOSIS — E876 Hypokalemia: Secondary | ICD-10-CM

## 2017-10-23 DIAGNOSIS — I10 Essential (primary) hypertension: Secondary | ICD-10-CM

## 2017-10-23 DIAGNOSIS — R0789 Other chest pain: Secondary | ICD-10-CM

## 2017-10-23 NOTE — Progress Notes (Signed)
Subjective:    Patient ID: Kirsten Long, female    DOB: 06-09-1986, 32 y.o.   MRN: [id number]  No chief complaint on file. Patient is accompanied by her husband.  HPI Patient was seen today for follow-up on blood pressure and hypokalemia.  During last office visit patient was just started on Norvasc 5 mg for elevated BP by the ED on 10/04/17.  Pt was also started on K-dur as her potassium was noted at 3.3 on labs from ED.    Since last OFV, patient states she has been doing well.  She denies headaches, blurred vision, shortness of breath.  She does endorse experiencing an episode of chest wall pain last night.  It felt like a knot was in her left chest throbbing.  She was laying in bed at the time of this incident.  Patient states that may have lasted 45 minutes and then stop.  She has not had any pain since then.  Patient cannot recall any strenuous activity that may have caused the pain.  Cardiac workup was negative in the ED on 10/04/17.  Patient has been trying to drink more water at home.  She states she has not made changes to her diet.  Past Medical History:  Diagnosis Date  . Arthritis   . HSV (herpes simplex virus) infection   . Hypertension   . Infection    Valtrex for HSV 2009   . Urinary tract infection     No Known Allergies  ROS General: Denies fever, chills, night sweats, changes in weight, changes in appetite HEENT: Denies headaches, ear pain, changes in vision, rhinorrhea, sore throat CV: Denies palpitations, SOB, orthopnea  +CP Pulm: Denies SOB, cough, wheezing GI: Denies abdominal pain, nausea, vomiting, diarrhea, constipation GU: Denies dysuria, hematuria, frequency, vaginal discharge Msk: Denies muscle cramps, joint pains  +CP Neuro: Denies weakness, numbness, tingling Skin: Denies rashes, bruising Psych: Denies depression, anxiety, hallucinations     Objective:    Blood pressure 122/82, pulse (!) 103, temperature 98.5 F (36.9 C),  temperature source Oral, weight 173 lb 4.8 oz (78.6 kg).   Gen. Pleasant, well-nourished, in no distress, normal affect   HEENT: [place]/AT, face symmetric, no scleral icterus, PERRLA, nares patent without drainage, pharynx without erythema or exudate. Lungs: no accessory muscle use, CTAB, no wheezes or rales Cardiovascular: RRR, no m/r/g, no peripheral edema MSK: TTP of left upper chest.  No TTP of right sided chest Neuro:  A&Ox3, CN II-XII intact, normal gait    Wt Readings from Last 3 Encounters:  10/09/17 176 lb 1.6 oz (79.9 kg)  10/04/17 175 lb (79.4 kg)  08/20/17 176 lb (79.8 kg)    Lab Results  Component Value Date   WBC 6.8 10/04/2017   HGB 12.1 10/04/2017   HCT 35.9 (L) 10/04/2017   PLT 424 (H) 10/04/2017   GLUCOSE 91 10/04/2017   ALT 10 08/20/2017   AST 14 08/20/2017   NA 136 10/04/2017   K 3.3 (L) 10/04/2017   CL 106 10/04/2017   CREATININE 0.70 10/04/2017   BUN 9 10/04/2017   CO2 21 (L) 10/04/2017    Assessment/Plan:  Essential hypertension  -improving -readings at home elevated, but controlled in office -will continue Norvasc 5 mg daily. -continue daily bp readings -continue lifestyle modifications.  Pt's husband willing to make changes to encourage pt. -BP continues to be elevated will consider increasing Norvasc at next OFV. -f/u in 1 month, sooner if needed.  Hypokalemia  -elevated in ED.  Pt started on Kdur. - Plan: Recheck potassium  Chest wall pain -likely MSK in nature as reproducible on exam and testing in ED reassuring. -Ok to take Tylenol or Ibuprofen prn for pain/discomfort -ok to use warm compress for discomfort -if continues will consider Holter monitor.     Grier Mitts, MD

## 2017-10-23 NOTE — Patient Instructions (Addendum)
DASH Eating Plan DASH stands for "Dietary Approaches to Stop Hypertension." The DASH eating plan is a healthy eating plan that has been shown to reduce high blood pressure (hypertension). It may also reduce your risk for type 2 diabetes, heart disease, and stroke. The DASH eating plan may also help with weight loss. What are tips for following this plan? General guidelines  Avoid eating more than 2,300 mg (milligrams) of salt (sodium) a day. If you have hypertension, you may need to reduce your sodium intake to 1,500 mg a day.  Limit alcohol intake to no more than 1 drink a day for nonpregnant women and 2 drinks a day for men. One drink equals 12 oz of beer, 5 oz of wine, or 1 oz of hard liquor.  Work with your health care provider to maintain a healthy body weight or to lose weight. Ask what an ideal weight is for you.  Get at least 30 minutes of exercise that causes your heart to beat faster (aerobic exercise) most days of the week. Activities may include walking, swimming, or biking.  Work with your health care provider or diet and nutrition specialist (dietitian) to adjust your eating plan to your individual calorie needs. Reading food labels  Check food labels for the amount of sodium per serving. Choose foods with less than 5 percent of the Daily Value of sodium. Generally, foods with less than 300 mg of sodium per serving fit into this eating plan.  To find whole grains, look for the word "whole" as the first word in the ingredient list. Shopping  Buy products labeled as "low-sodium" or "no salt added."  Buy fresh foods. Avoid canned foods and premade or frozen meals. Cooking  Avoid adding salt when cooking. Use salt-free seasonings or herbs instead of table salt or sea salt. Check with your health care provider or pharmacist before using salt substitutes.  Do not fry foods. Cook foods using healthy methods such as baking, boiling, grilling, and broiling instead.  Cook with  heart-healthy oils, such as olive, canola, soybean, or sunflower oil. Meal planning   Eat a balanced diet that includes: ? 5 or more servings of fruits and vegetables each day. At each meal, try to fill half of your plate with fruits and vegetables. ? Up to 6-8 servings of whole grains each day. ? Less than 6 oz of lean meat, poultry, or fish each day. A 3-oz serving of meat is about the same size as a deck of cards. One egg equals 1 oz. ? 2 servings of low-fat dairy each day. ? A serving of nuts, seeds, or beans 5 times each week. ? Heart-healthy fats. Healthy fats called Omega-3 fatty acids are found in foods such as flaxseeds and coldwater fish, like sardines, salmon, and mackerel.  Limit how much you eat of the following: ? Canned or prepackaged foods. ? Food that is high in trans fat, such as fried foods. ? Food that is high in saturated fat, such as fatty meat. ? Sweets, desserts, sugary drinks, and other foods with added sugar. ? Full-fat dairy products.  Do not salt foods before eating.  Try to eat at least 2 vegetarian meals each week.  Eat more home-cooked food and less restaurant, buffet, and fast food.  When eating at a restaurant, ask that your food be prepared with less salt or no salt, if possible. What foods are recommended? The items listed may not be a complete list. Talk with your dietitian about what   dietary choices are best for you. Grains Whole-grain or whole-wheat bread. Whole-grain or whole-wheat pasta. Brown rice. Oatmeal. Quinoa. Bulgur. Whole-grain and low-sodium cereals. Pita bread. Low-fat, low-sodium crackers. Whole-wheat flour tortillas. Vegetables Fresh or frozen vegetables (raw, steamed, roasted, or grilled). Low-sodium or reduced-sodium tomato and vegetable juice. Low-sodium or reduced-sodium tomato sauce and tomato paste. Low-sodium or reduced-sodium canned vegetables. Fruits All fresh, dried, or frozen fruit. Canned fruit in natural juice (without  added sugar). Meat and other protein foods Skinless chicken or turkey. Ground chicken or turkey. Pork with fat trimmed off. Fish and seafood. Egg whites. Dried beans, peas, or lentils. Unsalted nuts, nut butters, and seeds. Unsalted canned beans. Lean cuts of beef with fat trimmed off. Low-sodium, lean deli meat. Dairy Low-fat (1%) or fat-free (skim) milk. Fat-free, low-fat, or reduced-fat cheeses. Nonfat, low-sodium ricotta or cottage cheese. Low-fat or nonfat yogurt. Low-fat, low-sodium cheese. Fats and oils Soft margarine without trans fats. Vegetable oil. Low-fat, reduced-fat, or light mayonnaise and salad dressings (reduced-sodium). Canola, safflower, olive, soybean, and sunflower oils. Avocado. Seasoning and other foods Herbs. Spices. Seasoning mixes without salt. Unsalted popcorn and pretzels. Fat-free sweets. What foods are not recommended? The items listed may not be a complete list. Talk with your dietitian about what dietary choices are best for you. Grains Baked goods made with fat, such as croissants, muffins, or some breads. Dry pasta or rice meal packs. Vegetables Creamed or fried vegetables. Vegetables in a cheese sauce. Regular canned vegetables (not low-sodium or reduced-sodium). Regular canned tomato sauce and paste (not low-sodium or reduced-sodium). Regular tomato and vegetable juice (not low-sodium or reduced-sodium). Pickles. Olives. Fruits Canned fruit in a light or heavy syrup. Fried fruit. Fruit in cream or butter sauce. Meat and other protein foods Fatty cuts of meat. Ribs. Fried meat. Bacon. Sausage. Bologna and other processed lunch meats. Salami. Fatback. Hotdogs. Bratwurst. Salted nuts and seeds. Canned beans with added salt. Canned or smoked fish. Whole eggs or egg yolks. Chicken or turkey with skin. Dairy Whole or 2% milk, cream, and half-and-half. Whole or full-fat cream cheese. Whole-fat or sweetened yogurt. Full-fat cheese. Nondairy creamers. Whipped toppings.  Processed cheese and cheese spreads. Fats and oils Butter. Stick margarine. Lard. Shortening. Ghee. Bacon fat. Tropical oils, such as coconut, palm kernel, or palm oil. Seasoning and other foods Salted popcorn and pretzels. Onion salt, garlic salt, seasoned salt, table salt, and sea salt. Worcestershire sauce. Tartar sauce. Barbecue sauce. Teriyaki sauce. Soy sauce, including reduced-sodium. Steak sauce. Canned and packaged gravies. Fish sauce. Oyster sauce. Cocktail sauce. Horseradish that you find on the shelf. Ketchup. Mustard. Meat flavorings and tenderizers. Bouillon cubes. Hot sauce and Tabasco sauce. Premade or packaged marinades. Premade or packaged taco seasonings. Relishes. Regular salad dressings. Where to find more information:  National Heart, Lung, and Blood Institute: www.nhlbi.nih.gov  American Heart Association: www.heart.org Summary  The DASH eating plan is a healthy eating plan that has been shown to reduce high blood pressure (hypertension). It may also reduce your risk for type 2 diabetes, heart disease, and stroke.  With the DASH eating plan, you should limit salt (sodium) intake to 2,300 mg a day. If you have hypertension, you may need to reduce your sodium intake to 1,500 mg a day.  When on the DASH eating plan, aim to eat more fresh fruits and vegetables, whole grains, lean proteins, low-fat dairy, and heart-healthy fats.  Work with your health care provider or diet and nutrition specialist (dietitian) to adjust your eating plan to your individual   calorie needs. This information is not intended to replace advice given to you by your health care provider. Make sure you discuss any questions you have with your health care provider. Document Released: 09/25/2011 Document Revised: 09/29/2016 Document Reviewed: 09/29/2016 Elsevier Interactive Patient Education  2018 [employer].  Managing Your Hypertension Hypertension is commonly called high blood pressure. This is when  the force of your blood pressing against the walls of your arteries is too strong. Arteries are blood vessels that carry blood from your heart throughout your body. Hypertension forces the heart to work harder to pump blood, and may cause the arteries to become narrow or stiff. Having untreated or uncontrolled hypertension can cause heart attack, stroke, kidney disease, and other problems. What are blood pressure readings? A blood pressure reading consists of a higher number over a lower number. Ideally, your blood pressure should be below 120/80. The first ("top") number is called the systolic pressure. It is a measure of the pressure in your arteries as your heart beats. The second ("bottom") number is called the diastolic pressure. It is a measure of the pressure in your arteries as the heart relaxes. What does my blood pressure reading mean? Blood pressure is classified into four stages. Based on your blood pressure reading, your health care provider may use the following stages to determine what type of treatment you need, if any. Systolic pressure and diastolic pressure are measured in a unit called mm Hg. Normal  Systolic pressure: below 474.  Diastolic pressure: below 80. Elevated  Systolic pressure: 259-563.  Diastolic pressure: below 80. Hypertension stage 1  Systolic pressure: 875-643.  Diastolic pressure: 32-95. Hypertension stage 2  Systolic pressure: 188 or above.  Diastolic pressure: 90 or above. What health risks are associated with hypertension? Managing your hypertension is an important responsibility. Uncontrolled hypertension can lead to:  A heart attack.  A stroke.  A weakened blood vessel (aneurysm).  Heart failure.  Kidney damage.  Eye damage.  Metabolic syndrome.  Memory and concentration problems.  What changes can I make to manage my hypertension? Hypertension can be managed by making lifestyle changes and possibly by taking medicines. Your  health care provider will help you make a plan to bring your blood pressure within a normal range. Eating and drinking  Eat a diet that is high in fiber and potassium, and low in salt (sodium), added sugar, and fat. An example eating plan is called the DASH (Dietary Approaches to Stop Hypertension) diet. To eat this way: ? Eat plenty of fresh fruits and vegetables. Try to fill half of your plate at each meal with fruits and vegetables. ? Eat whole grains, such as whole wheat pasta, brown rice, or whole grain bread. Fill about one quarter of your plate with whole grains. ? Eat low-fat diary products. ? Avoid fatty cuts of meat, processed or cured meats, and poultry with skin. Fill about one quarter of your plate with lean proteins such as fish, chicken without skin, beans, eggs, and tofu. ? Avoid premade and processed foods. These tend to be higher in sodium, added sugar, and fat.  Reduce your daily sodium intake. Most people with hypertension should eat less than 1,500 mg of sodium a day.  Limit alcohol intake to no more than 1 drink a day for nonpregnant women and 2 drinks a day for men. One drink equals 12 oz of beer, 5 oz of wine, or 1 oz of hard liquor. Lifestyle  Work with your health care provider  to maintain a healthy body weight, or to lose weight. Ask what an ideal weight is for you.  Get at least 30 minutes of exercise that causes your heart to beat faster (aerobic exercise) most days of the week. Activities may include walking, swimming, or biking.  Include exercise to strengthen your muscles (resistance exercise), such as weight lifting, as part of your weekly exercise routine. Try to do these types of exercises for 30 minutes at least 3 days a week.  Do not use any products that contain nicotine or tobacco, such as cigarettes and e-cigarettes. If you need help quitting, ask your health care provider.  Control any long-term (chronic) conditions you have, such as high cholesterol  or diabetes. Monitoring  Monitor your blood pressure at home as told by your health care provider. Your personal target blood pressure may vary depending on your medical conditions, your age, and other factors.  Have your blood pressure checked regularly, as often as told by your health care provider. Working with your health care provider  Review all the medicines you take with your health care provider because there may be side effects or interactions.  Talk with your health care provider about your diet, exercise habits, and other lifestyle factors that may be contributing to hypertension.  Visit your health care provider regularly. Your health care provider can help you create and adjust your plan for managing hypertension. Will I need medicine to control my blood pressure? Your health care provider may prescribe medicine if lifestyle changes are not enough to get your blood pressure under control, and if:  Your systolic blood pressure is 130 or higher.  Your diastolic blood pressure is 80 or higher.  Take medicines only as told by your health care provider. Follow the directions carefully. Blood pressure medicines must be taken as prescribed. The medicine does not work as well when you skip doses. Skipping doses also puts you at risk for problems. Contact a health care provider if:  You think you are having a reaction to medicines you have taken.  You have repeated (recurrent) headaches.  You feel dizzy.  You have swelling in your ankles.  You have trouble with your vision. Get help right away if:  You develop a severe headache or confusion.  You have unusual weakness or numbness, or you feel faint.  You have severe pain in your chest or abdomen.  You vomit repeatedly.  You have trouble breathing. Summary  Hypertension is when the force of blood pumping through your arteries is too strong. If this condition is not controlled, it may put you at risk for serious  complications.  Your personal target blood pressure may vary depending on your medical conditions, your age, and other factors. For most people, a normal blood pressure is less than 120/80.  Hypertension is managed by lifestyle changes, medicines, or both. Lifestyle changes include weight loss, eating a healthy, low-sodium diet, exercising more, and limiting alcohol. This information is not intended to replace advice given to you by your health care provider. Make sure you discuss any questions you have with your health care provider. Document Released: 06/30/2012 Document Revised: 09/03/2016 Document Reviewed: 09/03/2016 Elsevier Interactive Patient Education  2018 [employer]  

## 2017-10-24 LAB — POTASSIUM: Potassium: 4 mmol/L (ref 3.5–5.3)

## 2017-11-23 ENCOUNTER — Ambulatory Visit: Payer: 59 | Admitting: Family Medicine

## 2017-12-04 ENCOUNTER — Ambulatory Visit (INDEPENDENT_AMBULATORY_CARE_PROVIDER_SITE_OTHER): Payer: 59 | Admitting: Family Medicine

## 2017-12-04 ENCOUNTER — Encounter: Payer: Self-pay | Admitting: Family Medicine

## 2017-12-04 VITALS — BP 130/90 | HR 76 | Temp 98.6°F | Wt 169.6 lb

## 2017-12-04 DIAGNOSIS — E876 Hypokalemia: Secondary | ICD-10-CM

## 2017-12-04 DIAGNOSIS — I10 Essential (primary) hypertension: Secondary | ICD-10-CM

## 2017-12-04 LAB — POTASSIUM: Potassium: 4.3 mEq/L (ref 3.5–5.1)

## 2017-12-04 NOTE — Patient Instructions (Addendum)
Congratulations on your weight loss!  You have lost 4 pounds since her last visit on 10/23/17.  Realistically mild loss a little more because I think her skills are off.  Continues to keep up the great work!  We will recheck her blood pressure in the next few months.  At that time we can also recheck your potassium, if it continues to be normal we can probably stop the K [place].  DASH Eating Plan DASH stands for "Dietary Approaches to Stop Hypertension." The DASH eating plan is a healthy eating plan that has been shown to reduce high blood pressure (hypertension). It may also reduce your risk for type 2 diabetes, heart disease, and stroke. The DASH eating plan may also help with weight loss. What are tips for following this plan? General guidelines  Avoid eating more than 2,300 mg (milligrams) of salt (sodium) a day. If you have hypertension, you may need to reduce your sodium intake to 1,500 mg a day.  Limit alcohol intake to no more than 1 drink a day for nonpregnant women and 2 drinks a day for men. One drink equals 12 oz of beer, 5 oz of wine, or 1 oz of hard liquor.  Work with your health care provider to maintain a healthy body weight or to lose weight. Ask what an ideal weight is for you.  Get at least 30 minutes of exercise that causes your heart to beat faster (aerobic exercise) most days of the week. Activities may include walking, swimming, or biking.  Work with your health care provider or diet and nutrition specialist (dietitian) to adjust your eating plan to your individual calorie needs. Reading food labels  Check food labels for the amount of sodium per serving. Choose foods with less than 5 percent of the Daily Value of sodium. Generally, foods with less than 300 mg of sodium per serving fit into this eating plan.  To find whole grains, look for the word "whole" as the first word in the ingredient list. Shopping  Buy products labeled as "low-sodium" or "no salt added."  Buy  fresh foods. Avoid canned foods and premade or frozen meals. Cooking  Avoid adding salt when cooking. Use salt-free seasonings or herbs instead of table salt or sea salt. Check with your health care provider or pharmacist before using salt substitutes.  Do not fry foods. Cook foods using healthy methods such as baking, boiling, grilling, and broiling instead.  Cook with heart-healthy oils, such as olive, canola, soybean, or sunflower oil. Meal planning   Eat a balanced diet that includes: ? 5 or more servings of fruits and vegetables each day. At each meal, try to fill half of your plate with fruits and vegetables. ? Up to 6-8 servings of whole grains each day. ? Less than 6 oz of lean meat, poultry, or fish each day. A 3-oz serving of meat is about the same size as a deck of cards. One egg equals 1 oz. ? 2 servings of low-fat dairy each day. ? A serving of nuts, seeds, or beans 5 times each week. ? Heart-healthy fats. Healthy fats called Omega-3 fatty acids are found in foods such as flaxseeds and coldwater fish, like sardines, salmon, and mackerel.  Limit how much you eat of the following: ? Canned or prepackaged foods. ? Food that is high in trans fat, such as fried foods. ? Food that is high in saturated fat, such as fatty meat. ? Sweets, desserts, sugary drinks, and other foods with  added sugar. ? Full-fat dairy products.  Do not salt foods before eating.  Try to eat at least 2 vegetarian meals each week.  Eat more home-cooked food and less restaurant, buffet, and fast food.  When eating at a restaurant, ask that your food be prepared with less salt or no salt, if possible. What foods are recommended? The items listed may not be a complete list. Talk with your dietitian about what dietary choices are best for you. Grains Whole-grain or whole-wheat bread. Whole-grain or whole-wheat pasta. Brown rice. [place]. Bulgur. Whole-grain and low-sodium cereals. Pita bread.  Low-fat, low-sodium crackers. Whole-wheat flour tortillas. Vegetables Fresh or frozen vegetables (raw, steamed, roasted, or grilled). Low-sodium or reduced-sodium tomato and vegetable juice. Low-sodium or reduced-sodium tomato sauce and tomato paste. Low-sodium or reduced-sodium canned vegetables. Fruits All fresh, dried, or frozen fruit. Canned fruit in natural juice (without added sugar). Meat and other protein foods Skinless chicken or [place]. Ground chicken or [place]. Pork with fat trimmed off. Fish and seafood. Egg whites. Dried beans, peas, or lentils. Unsalted nuts, nut butters, and seeds. Unsalted canned beans. Lean cuts of beef with fat trimmed off. Low-sodium, lean deli meat. Dairy Low-fat (1%) or fat-free (skim) milk. Fat-free, low-fat, or reduced-fat cheeses. Nonfat, low-sodium ricotta or cottage cheese. Low-fat or nonfat yogurt. Low-fat, low-sodium cheese. Fats and oils Soft margarine without trans fats. Vegetable oil. Low-fat, reduced-fat, or light mayonnaise and salad dressings (reduced-sodium). Canola, safflower, olive, soybean, and sunflower oils. Avocado. Seasoning and other foods Herbs. Spices. Seasoning mixes without salt. Unsalted popcorn and pretzels. Fat-free sweets. What foods are not recommended? The items listed may not be a complete list. Talk with your dietitian about what dietary choices are best for you. Grains Baked goods made with fat, such as croissants, muffins, or some breads. Dry pasta or rice meal packs. Vegetables Creamed or fried vegetables. Vegetables in a cheese sauce. Regular canned vegetables (not low-sodium or reduced-sodium). Regular canned tomato sauce and paste (not low-sodium or reduced-sodium). Regular tomato and vegetable juice (not low-sodium or reduced-sodium). Angie Fava. Olives. Fruits Canned fruit in a light or heavy syrup. Fried fruit. Fruit in cream or butter sauce. Meat and other protein foods Fatty cuts of meat. Ribs. Fried meat. Berniece Salines.  Sausage. Bologna and other processed lunch meats. Salami. Fatback. Hotdogs. Bratwurst. Salted nuts and seeds. Canned beans with added salt. Canned or smoked fish. Whole eggs or egg yolks. Chicken or [place] with skin. Dairy Whole or 2% milk, cream, and half-and-half. Whole or full-fat cream cheese. Whole-fat or sweetened yogurt. Full-fat cheese. Nondairy creamers. Whipped toppings. Processed cheese and cheese spreads. Fats and oils Butter. Stick margarine. Lard. Shortening. Ghee. Bacon fat. Tropical oils, such as coconut, palm kernel, or palm oil. Seasoning and other foods Salted popcorn and pretzels. Onion salt, garlic salt, seasoned salt, table salt, and sea salt. Worcestershire sauce. Tartar sauce. Barbecue sauce. Teriyaki sauce. Soy sauce, including reduced-sodium. Steak sauce. Canned and packaged gravies. Fish sauce. Oyster sauce. Cocktail sauce. Horseradish that you find on the shelf. Ketchup. Mustard. Meat flavorings and tenderizers. Bouillon cubes. Hot sauce and Tabasco sauce. Premade or packaged marinades. Premade or packaged taco seasonings. Relishes. Regular salad dressings. Where to find more information:  National Heart, Lung, and [place]: https://wilson-eaton.com/  American Heart Association: www.heart.org Summary  The DASH eating plan is a healthy eating plan that has been shown to reduce high blood pressure (hypertension). It may also reduce your risk for type 2 diabetes, heart disease, and stroke.  With the DASH  eating plan, you should limit salt (sodium) intake to 2,300 mg a day. If you have hypertension, you may need to reduce your sodium intake to 1,500 mg a day.  When on the DASH eating plan, aim to eat more fresh fruits and vegetables, whole grains, lean proteins, low-fat dairy, and heart-healthy fats.  Work with your health care provider or diet and nutrition specialist (dietitian) to adjust your eating plan to your individual calorie needs. This information is not intended  to replace advice given to you by your health care provider. Make sure you discuss any questions you have with your health care provider. Document Released: 09/25/2011 Document Revised: 09/29/2016 Document Reviewed: 09/29/2016 Elsevier Interactive Patient Education  2018 [employer].  Managing Your Hypertension Hypertension is commonly called high blood pressure. This is when the force of your blood pressing against the walls of your arteries is too strong. Arteries are blood vessels that carry blood from your heart throughout your body. Hypertension forces the heart to work harder to pump blood, and may cause the arteries to become narrow or stiff. Having untreated or uncontrolled hypertension can cause heart attack, stroke, kidney disease, and other problems. What are blood pressure readings? A blood pressure reading consists of a higher number over a lower number. Ideally, your blood pressure should be below 120/80. The first ("top") number is called the systolic pressure. It is a measure of the pressure in your arteries as your heart beats. The second ("bottom") number is called the diastolic pressure. It is a measure of the pressure in your arteries as the heart relaxes. What does my blood pressure reading mean? Blood pressure is classified into four stages. Based on your blood pressure reading, your health care provider may use the following stages to determine what type of treatment you need, if any. Systolic pressure and diastolic pressure are measured in a unit called mm Hg. Normal  Systolic pressure: below 630.  Diastolic pressure: below 80. Elevated  Systolic pressure: 160-109.  Diastolic pressure: below 80. Hypertension stage 1  Systolic pressure: 323-557.  Diastolic pressure: 32-20. Hypertension stage 2  Systolic pressure: 254 or above.  Diastolic pressure: 90 or above. What health risks are associated with hypertension? Managing your hypertension is an important  responsibility. Uncontrolled hypertension can lead to:  A heart attack.  A stroke.  A weakened blood vessel (aneurysm).  Heart failure.  Kidney damage.  Eye damage.  Metabolic syndrome.  Memory and concentration problems.  What changes can I make to manage my hypertension? Hypertension can be managed by making lifestyle changes and possibly by taking medicines. Your health care provider will help you make a plan to bring your blood pressure within a normal range. Eating and drinking  Eat a diet that is high in fiber and potassium, and low in salt (sodium), added sugar, and fat. An example eating plan is called the DASH (Dietary Approaches to Stop Hypertension) diet. To eat this way: ? Eat plenty of fresh fruits and vegetables. Try to fill half of your plate at each meal with fruits and vegetables. ? Eat whole grains, such as whole wheat pasta, brown rice, or whole grain bread. Fill about one quarter of your plate with whole grains. ? Eat low-fat diary products. ? Avoid fatty cuts of meat, processed or cured meats, and poultry with skin. Fill about one quarter of your plate with lean proteins such as fish, chicken without skin, beans, eggs, and tofu. ? Avoid premade and processed foods. These tend to  be higher in sodium, added sugar, and fat.  Reduce your daily sodium intake. Most people with hypertension should eat less than 1,500 mg of sodium a day.  Limit alcohol intake to no more than 1 drink a day for nonpregnant women and 2 drinks a day for men. One drink equals 12 oz of beer, 5 oz of wine, or 1 oz of hard liquor. Lifestyle  Work with your health care provider to maintain a healthy body weight, or to lose weight. Ask what an ideal weight is for you.  Get at least 30 minutes of exercise that causes your heart to beat faster (aerobic exercise) most days of the week. Activities may include walking, swimming, or biking.  Include exercise to strengthen your muscles (resistance  exercise), such as weight lifting, as part of your weekly exercise routine. Try to do these types of exercises for 30 minutes at least 3 days a week.  Do not use any products that contain nicotine or tobacco, such as cigarettes and e-cigarettes. If you need help quitting, ask your health care provider.  Control any long-term (chronic) conditions you have, such as high cholesterol or diabetes. Monitoring  Monitor your blood pressure at home as told by your health care provider. Your personal target blood pressure may vary depending on your medical conditions, your age, and other factors.  Have your blood pressure checked regularly, as often as told by your health care provider. Working with your health care provider  Review all the medicines you take with your health care provider because there may be side effects or interactions.  Talk with your health care provider about your diet, exercise habits, and other lifestyle factors that may be contributing to hypertension.  Visit your health care provider regularly. Your health care provider can help you create and adjust your plan for managing hypertension. Will I need medicine to control my blood pressure? Your health care provider may prescribe medicine if lifestyle changes are not enough to get your blood pressure under control, and if:  Your systolic blood pressure is 130 or higher.  Your diastolic blood pressure is 80 or higher.  Take medicines only as told by your health care provider. Follow the directions carefully. Blood pressure medicines must be taken as prescribed. The medicine does not work as well when you skip doses. Skipping doses also puts you at risk for problems. Contact a health care provider if:  You think you are having a reaction to medicines you have taken.  You have repeated (recurrent) headaches.  You feel dizzy.  You have swelling in your ankles.  You have trouble with your vision. Get help right away  if:  You develop a severe headache or confusion.  You have unusual weakness or numbness, or you feel faint.  You have severe pain in your chest or abdomen.  You vomit repeatedly.  You have trouble breathing. Summary  Hypertension is when the force of blood pumping through your arteries is too strong. If this condition is not controlled, it may put you at risk for serious complications.  Your personal target blood pressure may vary depending on your medical conditions, your age, and other factors. For most people, a normal blood pressure is less than 120/80.  Hypertension is managed by lifestyle changes, medicines, or both. Lifestyle changes include weight loss, eating a healthy, low-sodium diet, exercising more, and limiting alcohol. This information is not intended to replace advice given to you by your health care provider. Make sure you  discuss any questions you have with your health care provider. Document Released: 06/30/2012 Document Revised: 09/03/2016 Document Reviewed: 09/03/2016 Elsevier Interactive Patient Education  [employer].

## 2017-12-04 NOTE — Progress Notes (Signed)
Subjective:    Patient ID: Kirsten Long, female    DOB: 10-Mar-1986, 32 y.o.   MRN: [id number]  Chief Complaint  Patient presents with  . Follow-up    HPI Patient was seen today for follow-up on BP.  Patient has been taking Norvasc 5 mg daily without side effects.  Patient denies headache, chest pain, changes in vision, nausea, vomiting, lower extremity edema.  Patient endorses increasing physical activity, increasing p.o. intake of water, eating better.  Patient is excited about her 4 pound weight loss since last OFV.  Past Medical History:  Diagnosis Date  . Arthritis   . HSV (herpes simplex virus) infection   . Hypertension   . Infection    Valtrex for HSV 2009   . Urinary tract infection     No Known Allergies  ROS General: Denies fever, chills, night sweats, changes in weight, changes in appetite HEENT: Denies headaches, ear pain, changes in vision, rhinorrhea, sore throat CV: Denies CP, palpitations, SOB, orthopnea Pulm: Denies SOB, cough, wheezing GI: Denies abdominal pain, nausea, vomiting, diarrhea, constipation GU: Denies dysuria, hematuria, frequency, vaginal discharge Msk: Denies muscle cramps, joint pains Neuro: Denies weakness, numbness, tingling Skin: Denies rashes, bruising Psych: Denies depression, anxiety, hallucinations     Objective:    Blood pressure 130/90, pulse 76, temperature 98.6 F (37 C), temperature source Oral, weight 169 lb 9.6 oz (76.9 kg). bp recheck 136/78  Gen. Pleasant, well-nourished, in no distress, normal affect   HEENT: [place]/AT, face symmetric, no scleral icterus, PERRLA, nares patent without drainage Lungs: no accessory muscle use, CTAB, no wheezes or rales Cardiovascular: RRR, no m/r/g, no peripheral edema Abdomen: BS present, soft, NT/ND Neuro:  A&Ox3, CN II-XII intact, normal gait    Wt Readings from Last 3 Encounters:  12/04/17 169 lb 9.6 oz (76.9 kg)  10/23/17 173 lb 4.8 oz (78.6 kg)  10/09/17 176 lb 1.6  oz (79.9 kg)    Lab Results  Component Value Date   WBC 6.8 10/04/2017   HGB 12.1 10/04/2017   HCT 35.9 (L) 10/04/2017   PLT 424 (H) 10/04/2017   GLUCOSE 91 10/04/2017   ALT 10 08/20/2017   AST 14 08/20/2017   NA 136 10/04/2017   K 4.0 10/23/2017   CL 106 10/04/2017   CREATININE 0.70 10/04/2017   BUN 9 10/04/2017   CO2 21 (L) 10/04/2017    Assessment/Plan:  Essential hypertension  -improving  -bp recheck 136/78 -pt congratulated on lifestyle modifications.  Patient has lost 4 pounds since his visit on 10/23/17. -Confident pt can control blood pressure with diet and exercise.  If BP is still mildly elevated will increase Norvasc to 10 mg daily at next visit. -continue Norvasc 5 mg daily - Plan: Potassium  Hypokalemia - Plan: Potassium -will recheck potassium.  Was noted at 3.3 on 12/16, improved to 4.0 on 10/23/17 -if normal range, will stop K-dur 20 mEq  F/u in the next few months for bp.  Sooner if needed.  Grier Mitts, MD

## 2017-12-30 NOTE — Progress Notes (Deleted)
Office Visit Note  Patient: Kirsten Long             Date of Birth: 1986-06-03           MRN: [id number]             PCP: Billie Ruddy, MD Referring: Gaynelle Arabian, MD Visit Date: 01/13/2018 Occupation: @GUAROCC @    Subjective:  No chief complaint on file.   History of Present Illness: Kirsten Long is a 32 y.o. female ***   Activities of Daily Living:  Patient reports morning stiffness for *** {minute/hour:19697}.   Patient {ACTIONS;DENIES/REPORTS:21021675::"Denies"} nocturnal pain.  Difficulty dressing/grooming: {ACTIONS;DENIES/REPORTS:21021675::"Denies"} Difficulty climbing stairs: {ACTIONS;DENIES/REPORTS:21021675::"Denies"} Difficulty getting out of chair: {ACTIONS;DENIES/REPORTS:21021675::"Denies"} Difficulty using hands for taps, buttons, cutlery, and/or writing: {ACTIONS;DENIES/REPORTS:21021675::"Denies"}   No Rheumatology ROS completed.   PMFS History:  Patient Active Problem List   Diagnosis Date Noted  . Ankylosing spondylitis of multiple sites in spine ([place]) 08/20/2017  . Sacroiliitis ([place]) 08/20/2017  . Iritis 08/20/2017  . History of panic attacks 08/20/2017  . Essential hypertension 08/20/2017  . History of cholecystectomy 08/20/2017  . Ex-smoker 08/20/2017  . Cholecystitis, acute 10/14/2012    Past Medical History:  Diagnosis Date  . Arthritis   . HSV (herpes simplex virus) infection   . Hypertension   . Infection    Valtrex for HSV 2009   . Urinary tract infection     Family History  Problem Relation Age of Onset  . Hypertension Mother   . Hypertension Maternal Aunt   . Hypertension Maternal Uncle   . Cancer Maternal Grandmother   . Hypertension Maternal Grandmother   . Hypertension Maternal Grandfather   . Hypertension Paternal Grandmother   . Stroke Paternal Grandmother   . Hypertension Paternal Grandfather   . Stroke Paternal Grandfather   . Hypertension Father    Past Surgical History:    Procedure Laterality Date  . CHOLECYSTECTOMY  10/15/2012   Procedure: LAPAROSCOPIC CHOLECYSTECTOMY WITH INTRAOPERATIVE CHOLANGIOGRAM;  Surgeon: Shann Medal, MD;  Location: [facility];  Service: General;  Laterality: N/A;   Social History   Social History Narrative  . Not on file     Objective: Vital Signs: There were no vitals taken for this visit.   Physical Exam   Musculoskeletal Exam: ***  CDAI Exam: No CDAI exam completed.    Investigation: No additional findings.  TB Gold: 08/20/2017 Negative  CBC Latest Ref Rng & Units 10/04/2017 08/20/2017 06/03/2015  WBC 4.0 - 10.5 K/uL 6.8 5.0 8.3  Hemoglobin 12.0 - 15.0 g/dL 12.1 12.3 12.6  Hematocrit 36.0 - 46.0 % 35.9(L) 37.4 37.1  Platelets 150 - 400 K/uL 424(H) 501(H) 395   CMP Latest Ref Rng & Units 12/04/2017 10/23/2017 10/04/2017  Glucose 65 - 99 mg/dL - - 91  BUN 6 - 20 mg/dL - - 9  Creatinine 0.44 - 1.00 mg/dL - - 0.70  Sodium 135 - 145 mmol/L - - 136  Potassium 3.5 - 5.1 mEq/L 4.3 4.0 3.3(L)  Chloride 101 - 111 mmol/L - - 106  CO2 22 - 32 mmol/L - - 21(L)  Calcium 8.9 - 10.3 mg/dL - - 9.2  Total Protein 6.1 - 8.1 g/dL - - -  Total Bilirubin 0.2 - 1.2 mg/dL - - -  Alkaline Phos 39 - 117 U/L - - -  AST 10 - 30 U/L - - -  ALT 6 - 29 U/L - - -    Imaging: No results found.  Speciality Comments:  No specialty comments available.    Procedures:  No procedures performed Allergies: Patient has no known allergies.   Assessment / Plan:     Visit Diagnoses: No diagnosis found.    Orders: No orders of the defined types were placed in this encounter.  No orders of the defined types were placed in this encounter.   Face-to-face time spent with patient was *** minutes. 50% of time was spent in counseling and coordination of care.  Follow-Up Instructions: No Follow-up on file.   Earnestine Mealing, CMA  Note - This record has been created using Editor, commissioning.  Chart creation errors have been sought, but may  not always  have been located. Such creation errors do not reflect on  the standard of medical care.

## 2018-01-13 ENCOUNTER — Ambulatory Visit: Payer: 59 | Admitting: Rheumatology

## 2018-02-09 ENCOUNTER — Other Ambulatory Visit (HOSPITAL_COMMUNITY)
Admission: RE | Admit: 2018-02-09 | Discharge: 2018-02-09 | Disposition: A | Discharge status: Home / Self Care | Payer: 59 | Source: Ambulatory Visit | Attending: Obstetrics and Gynecology | Admitting: Obstetrics and Gynecology

## 2018-02-09 ENCOUNTER — Other Ambulatory Visit: Payer: Self-pay | Admitting: Obstetrics and Gynecology

## 2018-02-09 DIAGNOSIS — Z789 Other specified health status: Secondary | ICD-10-CM | POA: Diagnosis not present

## 2018-02-09 DIAGNOSIS — Z01411 Encounter for gynecological examination (general) (routine) with abnormal findings: Secondary | ICD-10-CM | POA: Insufficient documentation

## 2018-02-11 LAB — CYTOLOGY - PAP
Diagnosis: NEGATIVE
HPV (WINDOPATH): NOT DETECTED

## 2018-02-19 ENCOUNTER — Encounter: Payer: 59 | Admitting: Family Medicine

## 2018-10-06 ENCOUNTER — Other Ambulatory Visit: Payer: Self-pay

## 2018-10-06 ENCOUNTER — Encounter (HOSPITAL_COMMUNITY): Payer: Self-pay

## 2018-10-06 ENCOUNTER — Observation Stay (HOSPITAL_COMMUNITY)
Admission: EM | Admit: 2018-10-06 | Discharge: 2018-10-09 | DRG: 872 | Disposition: A | Discharge status: Home / Self Care | Payer: 59 | Attending: Internal Medicine | Admitting: Internal Medicine

## 2018-10-06 ENCOUNTER — Encounter: Payer: Self-pay | Admitting: Family Medicine

## 2018-10-06 ENCOUNTER — Emergency Department (HOSPITAL_COMMUNITY): Payer: 59

## 2018-10-06 ENCOUNTER — Ambulatory Visit (INDEPENDENT_AMBULATORY_CARE_PROVIDER_SITE_OTHER): Payer: 59 | Admitting: Family Medicine

## 2018-10-06 VITALS — BP 140/92 | HR 124 | Temp 102.0°F | Wt 170.0 lb

## 2018-10-06 DIAGNOSIS — R51 Headache: Secondary | ICD-10-CM | POA: Diagnosis not present

## 2018-10-06 DIAGNOSIS — M542 Cervicalgia: Secondary | ICD-10-CM

## 2018-10-06 DIAGNOSIS — M459 Ankylosing spondylitis of unspecified sites in spine: Secondary | ICD-10-CM | POA: Diagnosis not present

## 2018-10-06 DIAGNOSIS — R000 Tachycardia, unspecified: Secondary | ICD-10-CM

## 2018-10-06 DIAGNOSIS — R509 Fever, unspecified: Secondary | ICD-10-CM

## 2018-10-06 DIAGNOSIS — I10 Essential (primary) hypertension: Secondary | ICD-10-CM

## 2018-10-06 DIAGNOSIS — A419 Sepsis, unspecified organism: Secondary | ICD-10-CM | POA: Diagnosis not present

## 2018-10-06 DIAGNOSIS — R519 Headache, unspecified: Secondary | ICD-10-CM

## 2018-10-06 DIAGNOSIS — N390 Urinary tract infection, site not specified: Secondary | ICD-10-CM | POA: Diagnosis not present

## 2018-10-06 DIAGNOSIS — Z87891 Personal history of nicotine dependence: Secondary | ICD-10-CM

## 2018-10-06 DIAGNOSIS — Z9114 Patient's other noncompliance with medication regimen: Secondary | ICD-10-CM | POA: Diagnosis not present

## 2018-10-06 DIAGNOSIS — R319 Hematuria, unspecified: Secondary | ICD-10-CM

## 2018-10-06 DIAGNOSIS — Z79899 Other long term (current) drug therapy: Secondary | ICD-10-CM | POA: Diagnosis not present

## 2018-10-06 DIAGNOSIS — G43909 Migraine, unspecified, not intractable, without status migrainosus: Secondary | ICD-10-CM | POA: Diagnosis present

## 2018-10-06 DIAGNOSIS — G4453 Primary thunderclap headache: Secondary | ICD-10-CM | POA: Diagnosis not present

## 2018-10-06 DIAGNOSIS — N3001 Acute cystitis with hematuria: Secondary | ICD-10-CM

## 2018-10-06 DIAGNOSIS — Z8739 Personal history of other diseases of the musculoskeletal system and connective tissue: Secondary | ICD-10-CM | POA: Diagnosis not present

## 2018-10-06 LAB — URINALYSIS, ROUTINE W REFLEX MICROSCOPIC
Bilirubin Urine: NEGATIVE
Glucose, UA: NEGATIVE mg/dL
KETONES UR: 5 mg/dL — AB
NITRITE: NEGATIVE
Protein, ur: NEGATIVE mg/dL
Specific Gravity, Urine: 1.021 (ref 1.005–1.030)
pH: 7 (ref 5.0–8.0)

## 2018-10-06 LAB — COMPREHENSIVE METABOLIC PANEL
ALT: 13 U/L (ref 0–44)
AST: 18 U/L (ref 15–41)
Albumin: 4.3 g/dL (ref 3.5–5.0)
Alkaline Phosphatase: 64 U/L (ref 38–126)
Anion gap: 9 (ref 5–15)
BUN: 9 mg/dL (ref 6–20)
CO2: 26 mmol/L (ref 22–32)
Calcium: 9.2 mg/dL (ref 8.9–10.3)
Chloride: 99 mmol/L (ref 98–111)
Creatinine, Ser: 0.93 mg/dL (ref 0.44–1.00)
GFR calc non Af Amer: >60 mL/min (ref >60)
Glucose, Bld: 100 mg/dL — ABNORMAL HIGH (ref 70–99)
POTASSIUM: 3.5 mmol/L (ref 3.5–5.1)
Sodium: 134 mmol/L — ABNORMAL LOW (ref 135–145)
Total Bilirubin: 0.9 mg/dL (ref 0.3–1.2)
Total Protein: 8.4 g/dL — ABNORMAL HIGH (ref 6.5–8.1)

## 2018-10-06 LAB — CBC WITH DIFFERENTIAL/PLATELET
ABS IMMATURE GRANULOCYTES: 0.04 10*3/uL (ref 0.00–0.07)
Basophils Absolute: 0 10*3/uL (ref 0.0–0.1)
Basophils Relative: 0 %
Eosinophils Absolute: 0 10*3/uL (ref 0.0–0.5)
Eosinophils Relative: 0 %
HCT: 40 % (ref 36.0–46.0)
Hemoglobin: 13.1 g/dL (ref 12.0–15.0)
Immature Granulocytes: 0 %
Lymphocytes Relative: 10 %
Lymphs Abs: 1.1 10*3/uL (ref 0.7–4.0)
MCH: 29.4 pg (ref 26.0–34.0)
MCHC: 32.8 g/dL (ref 30.0–36.0)
MCV: 89.9 fL (ref 80.0–100.0)
Monocytes Absolute: 0.9 10*3/uL (ref 0.1–1.0)
Monocytes Relative: 9 %
NEUTROS ABS: 8.3 10*3/uL — AB (ref 1.7–7.7)
NRBC: 0 % (ref 0.0–0.2)
Neutrophils Relative %: 81 %
Platelets: 398 10*3/uL (ref 150–400)
RBC: 4.45 MIL/uL (ref 3.87–5.11)
RDW: 13.8 % (ref 11.5–15.5)
WBC: 10.4 10*3/uL (ref 4.0–10.5)

## 2018-10-06 LAB — I-STAT BETA HCG BLOOD, ED (MC, WL, AP ONLY): I-stat hCG, quantitative: 53.3 m[IU]/mL — ABNORMAL HIGH (ref <5)

## 2018-10-06 LAB — POC URINALSYSI DIPSTICK (AUTOMATED)
Bilirubin, UA: NEGATIVE
GLUCOSE UA: NEGATIVE
Nitrite, UA: NEGATIVE
Protein, UA: POSITIVE — AB
Spec Grav, UA: 1.02 (ref 1.010–1.025)
Urobilinogen, UA: 0.2 E.U./dL
pH, UA: 6 (ref 5.0–8.0)

## 2018-10-06 LAB — I-STAT CG4 LACTIC ACID, ED
Lactic Acid, Venous: 0.66 mmol/L (ref 0.5–1.9)
Lactic Acid, Venous: 0.74 mmol/L (ref 0.5–1.9)

## 2018-10-06 LAB — INFLUENZA PANEL BY PCR (TYPE A & B)
Influenza A By PCR: NEGATIVE
Influenza B By PCR: NEGATIVE

## 2018-10-06 LAB — GROUP A STREP BY PCR: Group A Strep by PCR: NOT DETECTED

## 2018-10-06 MED ORDER — SODIUM CHLORIDE 0.9 % IV BOLUS (SEPSIS)
1000.0000 mL | Freq: Once | INTRAVENOUS | Status: AC
  Administered 2018-10-06: 1000 mL via INTRAVENOUS

## 2018-10-06 MED ORDER — SODIUM CHLORIDE 0.9 % IV SOLN
1000.0000 mL | INTRAVENOUS | Status: DC
  Administered 2018-10-06 – 2018-10-08 (×4): 1000 mL via INTRAVENOUS

## 2018-10-06 MED ORDER — KETOROLAC TROMETHAMINE 30 MG/ML IJ SOLN
30.0000 mg | Freq: Once | INTRAMUSCULAR | Status: AC
  Administered 2018-10-06: 30 mg via INTRAVENOUS
  Filled 2018-10-06: qty 1

## 2018-10-06 MED ORDER — AMOXICILLIN-POT CLAVULANATE 875-125 MG PO TABS: 1.0000 | ORAL_TABLET | Freq: Once | ORAL | Status: DC

## 2018-10-06 MED ORDER — GADOBUTROL 1 MMOL/ML IV SOLN
7.5000 mL | Freq: Once | INTRAVENOUS | Status: AC | PRN
  Administered 2018-10-06: 7 mL via INTRAVENOUS

## 2018-10-06 MED ORDER — SULFAMETHOXAZOLE-TRIMETHOPRIM 800-160 MG PO TABS: 1.0000 | ORAL_TABLET | Freq: Two times a day (BID) | ORAL | 0 refills | Status: DC

## 2018-10-06 MED ORDER — ACETAMINOPHEN 325 MG PO TABS
650.0000 mg | ORAL_TABLET | Freq: Once | ORAL | Status: AC
  Administered 2018-10-06: 650 mg via ORAL
  Filled 2018-10-06: qty 2

## 2018-10-06 MED ORDER — METRONIDAZOLE 500 MG PO TABS: 500.0000 mg | ORAL_TABLET | Freq: Once | ORAL | Status: DC

## 2018-10-06 MED ORDER — SODIUM CHLORIDE 0.9 % IV SOLN: 1.0000 g | Freq: Once | INTRAVENOUS | Status: DC

## 2018-10-06 NOTE — ED Provider Notes (Addendum)
[facility] DEPT Provider Note   CSN: [id number] Arrival date & time: 10/06/18  1604     History   Chief Complaint Chief Complaint  Patient presents with  . Headache  . Fever    HPI Kirsten Long is a 32 y.o. female.  HPI Patient reports she is had a headache for approximately 6 days.  She reports the headache is mostly behind her eyes and forehead.  She reports she also has an area that is uncomfortable on the right side of her neck.  She reports that area feels uncomfortable if she turns her chin to the side and increases the pressure along the right side of the neck.  She does not have any pain in the back of the neck.  She was not aware that she had any fever.  Patient reports she has not actually felt sick per se.  She reports this headache has just persisted now for almost 6 days.  She reports that is there almost every day.  She reports she think she will feel better when she lays down, but at night if she is laying down and then when she tries to get up in the morning it just feels like an intense pressure pushing her back down on her head.  She denies she has had gait incoordination, weakness, numbness, tingling of extremities.  She denies any visual problems.  She denies photophobia.  She denies stiffness of her neck.  No nausea or vomiting.  She denies any sinus congestion or drainage.  No earache.  No dental pain.  Patient did have a induced abortion at approximately 11 weeks on 11\27\2019.  She reports that seemed uncomplicated.  She did not have problems with significant pain.  She reports for a week or so she had some bleeding and discharge but that is nearly tapered off now.  She denies any pelvic pain.  No pain burning or urgency with urination.  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  She denies she has had any activation or lesions for a long time.  Patient does have history of ankylosing spondylitis.  She reports that  she is not on any medications or immune suppressants.  She has not been on Humira for a year.  The only medication she takes is her blood pressure medication. Past Medical History:  Diagnosis Date  . Arthritis   . HSV (herpes simplex virus) infection   . Hypertension   . Infection    Valtrex for HSV 2009   . Urinary tract infection     Patient Active Problem List   Diagnosis Date Noted  . Ankylosing spondylitis of multiple sites in spine ([place]) 08/20/2017  . Sacroiliitis ([place]) 08/20/2017  . Iritis 08/20/2017  . History of panic attacks 08/20/2017  . Essential hypertension 08/20/2017  . History of cholecystectomy 08/20/2017  . Ex-smoker 08/20/2017  . Cholecystitis, acute 10/14/2012    Past Surgical History:  Procedure Laterality Date  . CHOLECYSTECTOMY  10/15/2012   Procedure: LAPAROSCOPIC CHOLECYSTECTOMY WITH INTRAOPERATIVE CHOLANGIOGRAM;  Surgeon: Shann Medal, MD;  Location: [facility];  Service: General;  Laterality: N/A;     OB History    Gravida  4   Para  3   Term  3   Preterm  0   AB  1   Living  3     SAB  1   TAB  0   Ectopic  0   Multiple  0   Live  Births  1            Home Medications    Prior to Admission medications   Medication Sig Start Date End Date Taking? Authorizing Provider  amLODipine (NORVASC) 5 MG tablet Take 1 tablet (5 mg total) by mouth daily. 10/09/17  Yes Billie Ruddy, MD  aspirin-acetaminophen-caffeine (EXCEDRIN MIGRAINE) [phone] MG tablet Take 1 tablet by mouth every 6 (six) hours as needed for headache or migraine.   Yes [provider]  potassium chloride SA (K-DUR,KLOR-CON) 20 MEQ tablet Take 1 tablet (20 mEq total) by mouth daily. Patient not taking: Reported on 10/06/2018 10/09/17   Billie Ruddy, MD  sulfamethoxazole-trimethoprim (BACTRIM DS,SEPTRA DS) 800-160 MG tablet Take 1 tablet by mouth 2 (two) times daily for 5 days. 10/06/18 10/11/18  Billie Ruddy, MD    Family History Family History   Problem Relation Age of Onset  . Hypertension Mother   . Hypertension Maternal Aunt   . Hypertension Maternal Uncle   . Cancer Maternal Grandmother   . Hypertension Maternal Grandmother   . Hypertension Maternal Grandfather   . Hypertension Paternal Grandmother   . Stroke Paternal Grandmother   . Hypertension Paternal Grandfather   . Stroke Paternal Grandfather   . Hypertension Father     Social History Social History   Tobacco Use  . Smoking status: Former Smoker    Years: 2.00    Last attempt to quit: 12/16/2011    Years since quitting: 6.8  . Smokeless tobacco: Never Used  Substance Use Topics  . Alcohol use: Yes    Comment: occassionally  . Drug use: No     Allergies   Patient has no known allergies.   Review of Systems Review of Systems 10 Systems reviewed and are negative for acute change except as noted in the HPI.   Physical Exam Updated Vital Signs BP (!) 135/92   Pulse (!) 114   Temp (!) 100.9 F (38.3 C) (Oral)   Resp 16   Ht '5\' 3"'  (1.6 m)   Wt 78.1 kg   LMP 07/02/2018 Comment: Pt states abortion performed on 11/29  SpO2 97%   BMI 30.50 kg/m   Physical Exam Constitutional:      General: She is not in acute distress.    Appearance: Normal appearance. She is not ill-appearing or toxic-appearing.  HENT:     Head: Normocephalic and atraumatic.     Right Ear: Tympanic membrane normal.     Left Ear: Tympanic membrane normal.     Nose: Nose normal.     Mouth/Throat:     Mouth: Mucous membranes are moist.     Pharynx: Oropharynx is clear.     Comments: Dentition is in good condition.  There is no areas of decay or tenderness.  The mucous membranes are in good condition.  Posterior oropharynx is widely patent.  No erythema no exudates. Eyes:     Extraocular Movements: Extraocular movements intact.     Pupils: Pupils are equal, round, and reactive to light.  Neck:     Comments: Neck is supple.  Patient does not have any meningismus.  She has  some tenderness along the anterior aspect of the sternocleidomastoid down to about the clavicle and just a little bit in the submandibular area on the right.  This is not exquisitely tender and I cannot appreciate mass or fullness.  This does not impact the patient's range of motion but she does feel increased discomfort and pressure  sensation turning her chin towards the right SCM. Cardiovascular:     Rate and Rhythm: Regular rhythm. Tachycardia present.     Heart sounds: Normal heart sounds.  Pulmonary:     Effort: Pulmonary effort is normal.     Breath sounds: Normal breath sounds.  Abdominal:     General: Abdomen is flat. There is no distension.     Palpations: Abdomen is soft.     Tenderness: There is no abdominal tenderness. There is no guarding.  Musculoskeletal: Normal range of motion.        General: No swelling, tenderness, deformity or signs of injury.     Right lower leg: No edema.     Left lower leg: No edema.  Skin:    General: Skin is warm and dry.     Findings: No rash.  Neurological:     General: No focal deficit present.     Mental Status: She is alert and oriented to person, place, and time.     Cranial Nerves: No cranial nerve deficit.     Sensory: No sensory deficit.     Motor: No weakness.     Coordination: Coordination normal.     Gait: Gait normal.     Comments: Patient is alert with normal mental status.  Speech is clear.  Cognitive function is normal and good.  All movements are coordinated purposeful and symmetric.  Strength normal through all extremities.  Psychiatric:        Mood and Affect: Mood normal.      ED Treatments / Results  Labs (all labs ordered are listed, but only abnormal results are displayed) Labs Reviewed  COMPREHENSIVE METABOLIC PANEL - Abnormal; Notable for the following components:      Result Value   Sodium 134 (*)    Glucose, Bld 100 (*)    Total Protein 8.4 (*)    All other components within normal limits  CBC WITH  DIFFERENTIAL/PLATELET - Abnormal; Notable for the following components:   Neutro Abs 8.3 (*)    All other components within normal limits  URINALYSIS, ROUTINE W REFLEX MICROSCOPIC - Abnormal; Notable for the following components:   Hgb urine dipstick MODERATE (*)    Ketones, ur 5 (*)    Leukocytes, UA MODERATE (*)    Bacteria, UA RARE (*)    All other components within normal limits  I-STAT BETA HCG BLOOD, ED ([facility], [facility], [facility] ONLY) - Abnormal; Notable for the following components:   I-stat hCG, quantitative 53.3 (*)    All other components within normal limits  GROUP A STREP BY PCR  CULTURE, BLOOD (ROUTINE X 2)  CULTURE, BLOOD (ROUTINE X 2)  INFLUENZA PANEL BY PCR (TYPE A & B)  I-STAT CG4 LACTIC ACID, ED  I-STAT CG4 LACTIC ACID, ED    EKG None  Radiology Dg Chest 2 View  Result Date: 10/06/2018 CLINICAL DATA:  Fever of unknown origin. EXAM: CHEST - 2 VIEW COMPARISON:  10/04/2017 FINDINGS: The heart size and mediastinal contours are within normal limits. Both lungs are clear. The visualized skeletal structures are unremarkable. IMPRESSION: Negative.  No active cardiopulmonary disease. Electronically Signed   By: Earle Gell M.D.   On: 10/06/2018 18:27    Procedures .Lumbar Puncture Date/Time: 10/07/2018 1:50 AM Performed by: Charlesetta Shanks, MD Authorized by: Charlesetta Shanks, MD   Consent:    Consent obtained:  Verbal   Consent given by:  Patient   Risks discussed:  Bleeding, infection, pain, repeat procedure, nerve damage  and headache   Alternatives discussed:  Delayed treatment Pre-procedure details:    Procedure purpose:  Diagnostic   Preparation: Patient was prepped and draped in usual sterile fashion   Anesthesia (see MAR for exact dosages):    Anesthesia method:  Local infiltration   Local anesthetic:  Lidocaine 1% w/o epi Procedure details:    Lumbar space:  L3-L4 interspace   Patient position:  Sitting   Needle gauge:  18   Needle type:  Spinal needle - Quincke  tip   Needle length (in):  3.5   Ultrasound guidance: no     Number of attempts:  2   Fluid appearance:  Clear   Tubes of fluid:  4 Post-procedure:    Puncture site:  Adhesive bandage applied   Patient tolerance of procedure:  Tolerated well, no immediate complications Comments:     First attempt at LP made in lateral decubitus at the L for L5 interspace.  Goal was to obtain opening pressure and lateral decubitus.  Unable to access spinal canal with patient in lying down position.  Patient then transitioned to sitting position and new LP kit used.  Patient was re-prepped and draped.  Spinal fluid on first attempt at the L3-L4 interspace.  Fluid was clear.   (including critical care time)  Medications Ordered in ED Medications  sodium chloride 0.9 % bolus 1,000 mL (1,000 mLs Intravenous New Bag/Given 10/06/18 1902)    Followed by  0.9 %  sodium chloride infusion (has no administration in time range)  acetaminophen (TYLENOL) tablet 650 mg (650 mg Oral Given 10/06/18 1720)  ketorolac (TORADOL) 30 MG/ML injection 30 mg (30 mg Intravenous Given 10/06/18 1858)     Initial Impression / Assessment and Plan / ED Course  I have reviewed the triage vital signs and the nursing notes.  Pertinent labs & imaging results that were available during my care of the patient were reviewed by me and considered in my medical decision making (see chart for details).  Clinical Course as of Oct 07 148  Wed Oct 06, 2018  [place] Consult:Neurology Dr. Lorraine Lax. Rec MRI with without.   [MP]  1935 Recheck:patient feels much better. No headache. Updated on MRI and possible long wait to get MRI.   [MP]  Thu Oct 07, 2018  0007 MRI has returned negative.  Patient however has had rebound of her headache.  She still has frontal headache with a lot of pressure and pain.  Heart rate remains tachycardic in the 120s to 130s.  Have reviewed this with neurology.  We will proceed with LP and empiric treatment with Rocephin and  acyclovir.  Neurology will consult.  Plan for admission observation.  Patient's mental status remains clear.  She has no signs of confusion or somnolence.  No focal neurologic deficits.   [MP]    Clinical Course User Index [MP] Charlesetta Shanks, MD   Patient presented as outlined above.  Despite hydration and pain management, she remained tachycardic.  Patient is mental status is normal.  Patient was not aware that she had febrile illness.  She reported that aside from the headache she did not actually feel sick.  However was fever, induced abortion within the past 3 weeks, had concern for possible infectious source.  Lumbar puncture obtained.  Patient to be admitted for continued diagnostic imaging and empiric treatment for meningitis until CSF results returned.  Final Clinical Impressions(s) / ED Diagnoses   Final diagnoses:  Bad headache Fever Persistent tachycardia  ED Discharge Orders    None       Charlesetta Shanks, MD 10/07/18 5400    Charlesetta Shanks, MD 10/10/18 1538

## 2018-10-06 NOTE — Patient Instructions (Signed)
Please proceed to nearest emergency department for further evaluation.  Your urine did show signs of urinary tract infection which could be contributing to your temperature.

## 2018-10-06 NOTE — Progress Notes (Signed)
Subjective:    Patient ID: Kirsten Long, female    DOB: 02/02/1986, 32 y.o.   MRN: [id number]  No chief complaint on file. Patient is accompanied by her husband.  HPI Patient was seen today for acute concern.  Pt endorses headaches, elevated BP x5 days.  Pt also endorses right neck swelling x1 week.  Pt tried Excedrin, ibuprofen, other NSAIDs for her headache with no relief.  Pain is continuous in the frontal area of her head.  BP has been 189/110, 139/70.  Pt states she has not been taking her Norvasc 5 mg regularly.  Last seen in February, given 30 pills with 3 refills.  States just got her 1st refill.  Pt denies sick contacts.  Past Medical History:  Diagnosis Date  . Arthritis   . HSV (herpes simplex virus) infection   . Hypertension   . Infection    Valtrex for HSV 2009   . Urinary tract infection     No Known Allergies  ROS General: Denies fever, chills, night sweats, changes in weight, changes in appetite HEENT: Denies ear pain, changes in vision, rhinorrhea, sore throat  +HA, R neck edema and tenderness CV: Denies CP, palpitations, SOB, orthopnea Pulm: Denies SOB, cough, wheezing GI: Denies abdominal pain, nausea, vomiting, diarrhea, constipation GU: Denies dysuria, hematuria, frequency, vaginal discharge Msk: Denies muscle cramps, joint pains Neuro: Denies weakness, numbness, tingling Skin: Denies rashes, bruising Psych: Denies depression, anxiety, hallucinations     Objective:    Blood pressure (!) 140/92, pulse (!) 124, temperature (!) 102 F (38.9 C), temperature source Oral, weight 170 lb (77.1 kg), SpO2 97 %.  Pulse initially 132.  Urine obtained.  Gen. Pleasant, well-nourished, appears sick/uncomfortable but not toxic, normal affect   HEENT: /AT, face symmetric, no scleral icterus, PERRLA, nares patent without drainage, pharynx without erythema or exudate.  TMs full bilaterally.  Cervical lymphadenopathy.  Right neck TTP.  Negative Brudzinski  sign. Lungs: no accessory muscle use, CTAB, no wheezes or rales Cardiovascular: Tachycardia, no m/r/g, no peripheral edema Musculoskeletal: TTP of right neck.  no deformities, no cyanosis or clubbing, normal tone Neuro:  A&Ox3, CN II-XII intact, normal gait Skin:  Warm, no lesions/ rash  Wt Readings from Last 3 Encounters:  10/06/18 170 lb (77.1 kg)  12/04/17 169 lb 9.6 oz (76.9 kg)  10/23/17 173 lb 4.8 oz (78.6 kg)    Lab Results  Component Value Date   WBC 6.8 10/04/2017   HGB 12.1 10/04/2017   HCT 35.9 (L) 10/04/2017   PLT 424 (H) 10/04/2017   GLUCOSE 91 10/04/2017   ALT 10 08/20/2017   AST 14 08/20/2017   NA 136 10/04/2017   K 4.3 12/04/2017   CL 106 10/04/2017   CREATININE 0.70 10/04/2017   BUN 9 10/04/2017   CO2 21 (L) 10/04/2017    Assessment/Plan: Given below symptoms and pt meeting SIRS criteria, advised to proceed to the nearest emergency department for further evaluation including labs, fluids, blood culture, etc.  Patient's husband will drive her to the ED.  Upon pt leaving for ED, UA results positive for UTI (pt aware).  Fever, unspecified fever cause -Possibly secondary to UTI  Tachycardia -HR 120s-130s -likely 2/2 fever and pain -EKG deferred as pt going to ED.  Essential hypertension -elevated -discussed the importance or taking medication on a consistent basis -Discussed need to restart Norvasc 5 mg daily. -Patient encouraged to check BP at home and keep a log to bring with her to clinic -Lifestyle  modifications encouraged  Daily headache -new -will send to ED for further eval including labs, hydration, etc. -Brudzinski sign negative however cannot rule out  meningitis  Neck pain - 2/2 lymphadenopathy from ongoing infection.   -Ddx: tension from ongoing HA, strep pharyngitis- though no exudate or edema noted on exam,  peritonsillar abscess- voice normal at this time, msk, other infectious cause such as mumps.  Acute cystitis with hematuria   -UA with 1+ ketones, 2+ blood, 2+ leuks, SG 1.020 -We will send for UCx -Rx for Bactrim sent to pharmacy.  Follow-up PRN  Grier Mitts, MD

## 2018-10-06 NOTE — ED Notes (Signed)
Patient transported to MRI 

## 2018-10-06 NOTE — ED Triage Notes (Signed)
Pt states that she has had a headache since Friday and has had a changes in her BP. Pt was seen at PCP and was told to come for fluids and CXR.

## 2018-10-07 ENCOUNTER — Emergency Department (HOSPITAL_COMMUNITY): Payer: 59

## 2018-10-07 ENCOUNTER — Encounter (HOSPITAL_COMMUNITY): Payer: Self-pay

## 2018-10-07 ENCOUNTER — Other Ambulatory Visit: Payer: Self-pay

## 2018-10-07 DIAGNOSIS — I10 Essential (primary) hypertension: Secondary | ICD-10-CM | POA: Diagnosis not present

## 2018-10-07 DIAGNOSIS — R519 Headache, unspecified: Secondary | ICD-10-CM | POA: Diagnosis present

## 2018-10-07 DIAGNOSIS — Z8739 Personal history of other diseases of the musculoskeletal system and connective tissue: Secondary | ICD-10-CM | POA: Diagnosis not present

## 2018-10-07 DIAGNOSIS — R509 Fever, unspecified: Secondary | ICD-10-CM

## 2018-10-07 DIAGNOSIS — R51 Headache: Secondary | ICD-10-CM

## 2018-10-07 DIAGNOSIS — N390 Urinary tract infection, site not specified: Secondary | ICD-10-CM | POA: Diagnosis present

## 2018-10-07 DIAGNOSIS — M542 Cervicalgia: Secondary | ICD-10-CM | POA: Diagnosis present

## 2018-10-07 DIAGNOSIS — A419 Sepsis, unspecified organism: Secondary | ICD-10-CM | POA: Diagnosis present

## 2018-10-07 LAB — RAPID URINE DRUG SCREEN, HOSP PERFORMED
Amphetamines: NOT DETECTED
Barbiturates: NOT DETECTED
Benzodiazepines: NOT DETECTED
Cocaine: NOT DETECTED
Opiates: NOT DETECTED
TETRAHYDROCANNABINOL: NOT DETECTED

## 2018-10-07 LAB — BASIC METABOLIC PANEL
Anion gap: 11 (ref 5–15)
BUN: 7 mg/dL (ref 6–20)
CO2: 23 mmol/L (ref 22–32)
Calcium: 8.2 mg/dL — ABNORMAL LOW (ref 8.9–10.3)
Chloride: 102 mmol/L (ref 98–111)
Creatinine, Ser: 0.93 mg/dL (ref 0.44–1.00)
GFR calc Af Amer: >60 mL/min (ref >60)
Glucose, Bld: 143 mg/dL — ABNORMAL HIGH (ref 70–99)
POTASSIUM: 3.4 mmol/L — AB (ref 3.5–5.1)
Sodium: 136 mmol/L (ref 135–145)

## 2018-10-07 LAB — CBC
HCT: 35.4 % — ABNORMAL LOW (ref 36.0–46.0)
Hemoglobin: 11.7 g/dL — ABNORMAL LOW (ref 12.0–15.0)
MCH: 29.9 pg (ref 26.0–34.0)
MCHC: 33.1 g/dL (ref 30.0–36.0)
MCV: 90.5 fL (ref 80.0–100.0)
NRBC: 0 % (ref 0.0–0.2)
Platelets: 360 10*3/uL (ref 150–400)
RBC: 3.91 MIL/uL (ref 3.87–5.11)
RDW: 13.9 % (ref 11.5–15.5)
WBC: 17.3 10*3/uL — ABNORMAL HIGH (ref 4.0–10.5)

## 2018-10-07 LAB — RESPIRATORY PANEL BY PCR
Adenovirus: NOT DETECTED
Bordetella pertussis: NOT DETECTED
CHLAMYDOPHILA PNEUMONIAE-RVPPCR: NOT DETECTED
Coronavirus 229E: NOT DETECTED
Coronavirus HKU1: NOT DETECTED
Coronavirus NL63: NOT DETECTED
Coronavirus OC43: NOT DETECTED
Influenza A: NOT DETECTED
Influenza B: NOT DETECTED
MYCOPLASMA PNEUMONIAE-RVPPCR: NOT DETECTED
Metapneumovirus: NOT DETECTED
PARAINFLUENZA VIRUS 3-RVPPCR: NOT DETECTED
Parainfluenza Virus 1: NOT DETECTED
Parainfluenza Virus 2: NOT DETECTED
Parainfluenza Virus 4: NOT DETECTED
Respiratory Syncytial Virus: NOT DETECTED
Rhinovirus / Enterovirus: NOT DETECTED

## 2018-10-07 LAB — SEDIMENTATION RATE: Sed Rate: 39 mm/hr — ABNORMAL HIGH (ref 0–22)

## 2018-10-07 LAB — C-REACTIVE PROTEIN: CRP: 9.5 mg/dL — ABNORMAL HIGH (ref <1.0)

## 2018-10-07 LAB — CSF CELL COUNT WITH DIFFERENTIAL
RBC Count, CSF: 0 /mm3
RBC Count, CSF: 12 /mm3 — ABNORMAL HIGH
TUBE #: 1
Tube #: 4
WBC, CSF: 1 /mm3 (ref 0–5)
WBC, CSF: 3 /mm3 (ref 0–5)

## 2018-10-07 LAB — PROTEIN AND GLUCOSE, CSF
GLUCOSE CSF: 54 mg/dL (ref 40–70)
Total  Protein, CSF: 23 mg/dL (ref 15–45)

## 2018-10-07 LAB — URINE CULTURE
MICRO NUMBER:: 91515050
SPECIMEN QUALITY:: ADEQUATE

## 2018-10-07 LAB — PREGNANCY, URINE: Preg Test, Ur: POSITIVE — AB

## 2018-10-07 LAB — TSH: TSH: 0.514 u[IU]/mL (ref 0.350–4.500)

## 2018-10-07 LAB — HIV ANTIBODY (ROUTINE TESTING W REFLEX): HIV Screen 4th Generation wRfx: NONREACTIVE

## 2018-10-07 MED ORDER — IOPAMIDOL (ISOVUE-370) INJECTION 76%
INTRAVENOUS | Status: AC
  Filled 2018-10-07: qty 100

## 2018-10-07 MED ORDER — DIPHENHYDRAMINE HCL 50 MG/ML IJ SOLN: 25.0000 mg | Freq: Four times a day (QID) | INTRAMUSCULAR | Status: DC | PRN

## 2018-10-07 MED ORDER — HYDRALAZINE HCL 20 MG/ML IJ SOLN
10.0000 mg | INTRAMUSCULAR | Status: DC | PRN
  Filled 2018-10-07: qty 0.5

## 2018-10-07 MED ORDER — SODIUM CHLORIDE 0.9 % IV SOLN
2.0000 g | Freq: Two times a day (BID) | INTRAVENOUS | Status: DC
  Administered 2018-10-07: 2 g via INTRAVENOUS
  Filled 2018-10-07: qty 20

## 2018-10-07 MED ORDER — DEXAMETHASONE SODIUM PHOSPHATE 10 MG/ML IJ SOLN
10.0000 mg | Freq: Once | INTRAMUSCULAR | Status: AC
  Administered 2018-10-07: 10 mg via INTRAVENOUS
  Filled 2018-10-07: qty 1

## 2018-10-07 MED ORDER — ENOXAPARIN SODIUM 40 MG/0.4ML ~~LOC~~ SOLN
40.0000 mg | SUBCUTANEOUS | Status: DC
  Administered 2018-10-07 – 2018-10-08 (×2): 40 mg via SUBCUTANEOUS
  Filled 2018-10-07 (×3): qty 0.4

## 2018-10-07 MED ORDER — DIPHENHYDRAMINE HCL 50 MG/ML IJ SOLN
25.0000 mg | Freq: Once | INTRAMUSCULAR | Status: AC
  Administered 2018-10-07: 25 mg via INTRAVENOUS
  Filled 2018-10-07: qty 1

## 2018-10-07 MED ORDER — BUTALBITAL-APAP-CAFFEINE 50-325-40 MG PO TABS: 1.0000 | ORAL_TABLET | Freq: Four times a day (QID) | ORAL | Status: DC | PRN

## 2018-10-07 MED ORDER — ACETAMINOPHEN 500 MG PO TABS
1000.0000 mg | ORAL_TABLET | ORAL | Status: AC
  Administered 2018-10-07: 1000 mg via ORAL
  Filled 2018-10-07: qty 2

## 2018-10-07 MED ORDER — KETOROLAC TROMETHAMINE 15 MG/ML IJ SOLN
15.0000 mg | Freq: Four times a day (QID) | INTRAMUSCULAR | Status: DC | PRN
  Administered 2018-10-07: 15 mg via INTRAVENOUS
  Filled 2018-10-07 (×2): qty 1

## 2018-10-07 MED ORDER — IOPAMIDOL (ISOVUE-370) INJECTION 76%
100.0000 mL | Freq: Once | INTRAVENOUS | Status: AC | PRN
  Administered 2018-10-07: 100 mL via INTRAVENOUS

## 2018-10-07 MED ORDER — DEXTROSE 5 % IV SOLN
780.0000 mg | Freq: Once | INTRAVENOUS | Status: AC
  Administered 2018-10-07: 780 mg via INTRAVENOUS
  Filled 2018-10-07: qty 15.6

## 2018-10-07 MED ORDER — AMLODIPINE BESYLATE 5 MG PO TABS
5.0000 mg | ORAL_TABLET | Freq: Every day | ORAL | Status: DC
  Administered 2018-10-07 – 2018-10-09 (×3): 5 mg via ORAL
  Filled 2018-10-07 (×3): qty 1

## 2018-10-07 MED ORDER — ACETAMINOPHEN 325 MG PO TABS: 650.0000 mg | ORAL_TABLET | Freq: Four times a day (QID) | ORAL | Status: DC | PRN

## 2018-10-07 MED ORDER — LIDOCAINE HCL (PF) 1 % IJ SOLN
INTRAMUSCULAR | Status: AC
  Administered 2018-10-07: 02:00:00
  Filled 2018-10-07: qty 30

## 2018-10-07 MED ORDER — SODIUM CHLORIDE 0.9% FLUSH
3.0000 mL | Freq: Two times a day (BID) | INTRAVENOUS | Status: DC
  Administered 2018-10-07 – 2018-10-09 (×5): 3 mL via INTRAVENOUS

## 2018-10-07 MED ORDER — SODIUM CHLORIDE (PF) 0.9 % IJ SOLN
INTRAMUSCULAR | Status: AC
  Filled 2018-10-07: qty 50

## 2018-10-07 MED ORDER — METOCLOPRAMIDE HCL 5 MG/ML IJ SOLN
10.0000 mg | Freq: Once | INTRAMUSCULAR | Status: AC
  Administered 2018-10-07: 10 mg via INTRAVENOUS
  Filled 2018-10-07: qty 2

## 2018-10-07 MED ORDER — KETOROLAC TROMETHAMINE 30 MG/ML IJ SOLN
30.0000 mg | Freq: Once | INTRAMUSCULAR | Status: AC
  Administered 2018-10-07: 30 mg via INTRAVENOUS
  Filled 2018-10-07: qty 1

## 2018-10-07 MED ORDER — POTASSIUM CHLORIDE CRYS ER 20 MEQ PO TBCR
30.0000 meq | EXTENDED_RELEASE_TABLET | ORAL | Status: AC
  Administered 2018-10-07: 30 meq via ORAL
  Filled 2018-10-07: qty 1

## 2018-10-07 MED ORDER — VANCOMYCIN HCL 10 G IV SOLR
1500.0000 mg | Freq: Once | INTRAVENOUS | Status: AC
  Administered 2018-10-07: 1500 mg via INTRAVENOUS
  Filled 2018-10-07: qty 1500

## 2018-10-07 MED ORDER — SODIUM CHLORIDE 0.9 % IV SOLN
2.0000 g | INTRAVENOUS | Status: DC
  Administered 2018-10-08 (×2): 2 g via INTRAVENOUS
  Filled 2018-10-07: qty 2
  Filled 2018-10-07: qty 20
  Filled 2018-10-07: qty 2
  Filled 2018-10-07: qty 20

## 2018-10-07 MED ORDER — LACTATED RINGERS IV BOLUS
1000.0000 mL | Freq: Once | INTRAVENOUS | Status: AC
  Administered 2018-10-07: 1000 mL via INTRAVENOUS

## 2018-10-07 NOTE — ED Notes (Signed)
Pt ambulating to bathroom.

## 2018-10-07 NOTE — ED Notes (Signed)
Pt care delayed to multiple LP attempts, and then transport to CT scan

## 2018-10-07 NOTE — Progress Notes (Signed)
CTA,/CTV head unremarkable for dissection or CVST.  CSF results not indicative of infectious process. Will await HSV PCR. OP not documented (prelim ER note mentions LP being done in sitting position, so pressure, even if taken sitting is not of any useful information)      Recs: From a neurological standpoint, does not need bacterial meningitis coverage antiobiotics - probably needs for urosepsis - per primary team. Continue Acyclovir for HSV coverage till PCR results are negative. Symptomatic headache management per primary team. Can use migraine cocktail.  -- Amie Portland, MD [facility] Pager: [phone] If 7pm to 7am, please call on call as listed on AMION.

## 2018-10-07 NOTE — ED Notes (Signed)
Lab contacted RN with CSF gram stain results. -organisms -WBCs

## 2018-10-07 NOTE — Progress Notes (Signed)
Pharmacy - Brief Note  IV acyclovir restricted to ID d/t national backorder.  Given acyclovir x 1 overnight.    Discussed case with ID and hospitalist, agree to hold acyclovir at this time.    Doreene Eland, PharmD, BCPS.   Work Cell: [phone] 10/07/2018 10:36 AM

## 2018-10-07 NOTE — Progress Notes (Signed)
PROGRESS NOTE    Kirsten Long  ZGY:[id number] DOB: 12-30-1985 DOA: 10/06/2018 PCP: Billie Ruddy, MD    Brief Narrative:  33 y.o. female with medical history significant of HTN, HSV, and HLA-B27 positive ankylosing spondylitis with iritis; who presents with complaints of a progressively worsening headache for the last 5 days.  Symptoms initially started with complains of right sided neck swelling and stiffness and then progressed into a headache.  Pain is severe and most notably in the back and front of her head.  She has had some sensitivity to light.  Denies having any recent sick contacts, vomiting, diarrhea, abdominal pain, joint pain, shortness of breath, cough, recent travel, tick exposure, changes in vision, or focal weakness.  Recently she had an induced abortion on 09/15/2018 that was reported to be uncomplicated.  Patient admits to not taking her antihypertensive medications amlodipine.  Patient had gone and seen her primary care provider earlier in the day and due to her symptoms have been advised to come to the emergency department for further evaluation.  She does not routinely get headaches and reports symptoms do not feel like previous flare of ankylosing spondylitis.   ED Course: On admission into the emergency department patient found to be febrile up to 103 F, heart rate 114-136, blood pressure up to 159/107, and other vital signs maintained.  Labs revealed WBC 10.4, sodium 134, lactic acid 0.66, and all other labs relatively within normal limits.  Influenza screen was negative.  Urinalysis was positive for moderate leukocytes, moderate hemoglobin, rare bacteria, and 11-20 WBCs.  MRI of the brain showed no acute abnormalities.  Lumbar puncture was obtained.  Neurology Dr. Rory Percy had been consulted due to patient's symptoms and recommended CT angiogram of the head and neck with and without contrast with CT venogram.  Assessment & Plan:   Principal Problem:  Sepsis ([place]) Active Problems:   Essential hypertension   H/O ankylosing spondylitis   Headache   Neck pain   Urinary tract infection  Sepsis secondary to urinary tract infection  - Patient presented with fevers and UA suggestive of UTI - also had headaches with concern for possible meningitis. Pt now s/p LP, meningitis ruled out per Neurology. Hold further abx - Continue on rocephin for UTI - Urine culture not obtained. Will order now - WBC up to 17k this AM. Currently afebrile - Repeat CBC in AM  Headache and right-sided neck pain, likely migraine:  - Patient reports having severe headache.   - continue Toradol and Benadryl provided temporary relief.  - Fioricet as needed for headache  Essential hypertension: Uncontrolled.   -Patient's blood pressures elevated up to 159/107 on admission.  Patient reports noncompliance with amlodipine. - Continued amlodipine - Blood pressure now much controlled   History of ankylosing spondylitis:  - Patient followed by rheumatology for symptoms.  She reports right-sided neck swelling, but does not report similar symptoms with previous flares in the past. - Stable at present  History of HSV -currently stable  DVT prophylaxis: Lovenox subQ Code Status: Full Family Communication: Pt in room, family not at bedside Disposition Plan: Uncertain at this time  Consultants:   Neurology  Procedures:   LP 12/18  Antimicrobials: Anti-infectives (From admission, onward)   Start     Dose/Rate Route Frequency Ordered Stop   10/07/18 2200  cefTRIAXone (ROCEPHIN) 2 g in sodium chloride 0.9 % 100 mL IVPB     2 g 200 mL/hr over 30 Minutes Intravenous Every 24 hours 10/07/18  0601     10/07/18 0145  vancomycin (VANCOCIN) 1,500 mg in sodium chloride 0.9 % 500 mL IVPB     1,500 mg 250 mL/hr over 120 Minutes Intravenous  Once 10/07/18 0131 10/07/18 0614   10/07/18 0015  cefTRIAXone (ROCEPHIN) 2 g in sodium chloride 0.9 % 100 mL IVPB  Status:   Discontinued     2 g 200 mL/hr over 30 Minutes Intravenous 2 times daily 10/07/18 0006 10/07/18 0601   10/07/18 0015  acyclovir (ZOVIRAX) 780 mg in dextrose 5 % 150 mL IVPB     780 mg 165.6 mL/hr over 60 Minutes Intravenous  Once 10/07/18 0010 10/07/18 0339   10/07/18 0000  cefTRIAXone (ROCEPHIN) 1 g in sodium chloride 0.9 % 100 mL IVPB  Status:  Discontinued     1 g 200 mL/hr over 30 Minutes Intravenous  Once 10/06/18 2358 10/07/18 0005   10/06/18 2345  amoxicillin-clavulanate (AUGMENTIN) 875-125 MG per tablet 1 tablet  Status:  Discontinued     1 tablet Oral  Once 10/06/18 2343 10/06/18 2348   10/06/18 2345  metroNIDAZOLE (FLAGYL) tablet 500 mg  Status:  Discontinued     500 mg Oral  Once 10/06/18 2343 10/06/18 2348       Subjective: Complaining of headache this AM  Objective: Vitals:   10/07/18 1019 10/07/18 1506 10/07/18 1623 10/07/18 1626  BP: 119/84 119/87  128/86  Pulse: 95 96  93  Resp: _0 Temp:    98.2 F (36.8 C)  TempSrc:   Oral Oral  SpO2: 98% 98% 98% 100%  Weight:      Height:        Intake/Output Summary (Last 24 hours) at 10/07/2018 1627 Last data filed at 10/07/2018 0431 Gross per 24 hour  Intake 3313.56 ml  Output -  Net 3313.56 ml   Filed Weights   10/06/18 1612  Weight: 78.1 kg    Examination:  General exam: laying on side, appearing uncomfortable Respiratory system: Clear to auscultation. Respiratory effort normal. Cardiovascular system: S1 & S2 heard, RRR Gastrointestinal system: Abdomen is nondistended, soft and nontender. No organomegaly or masses felt. Normal bowel sounds heard. Central nervous system: Alert and oriented. No focal neurological deficits. Extremities: Symmetric 5 x 5 power. Skin: No rashes, lesions  Psychiatry: Judgement and insight appear normal. Mood & affect appropriate.   Data Reviewed: I have personally reviewed following labs and imaging studies  CBC: Recent Labs  Lab 10/06/18 1649 10/07/18 0500    WBC 10.4 17.3*  NEUTROABS 8.3*  --   HGB 13.1 11.7*  HCT 40.0 35.4*  MCV 89.9 90.5  PLT 398 673   Basic Metabolic Panel: Recent Labs  Lab 10/06/18 1649 10/07/18 0500  NA 134* 136  K 3.5 3.4*  CL 99 102  CO2 26 23  GLUCOSE 100* 143*  BUN 9 7  CREATININE 0.93 0.93  CALCIUM 9.2 8.2*   GFR: Estimated Creatinine Clearance: 85.2 mL/min (by C-G formula based on SCr of 0.93 mg/dL). Liver Function Tests: Recent Labs  Lab 10/06/18 1649  AST 18  ALT 13  ALKPHOS 64  BILITOT 0.9  PROT 8.4*  ALBUMIN 4.3   No results for input(s): LIPASE, AMYLASE in the last 168 hours. No results for input(s): AMMONIA in the last 168 hours. Coagulation Profile: No results for input(s): INR, PROTIME in the last 168 hours. Cardiac Enzymes: No results for input(s): CKTOTAL, CKMB, CKMBINDEX, TROPONINI in the last 168 hours. BNP (last 3 results) No  results for input(s): PROBNP in the last 8760 hours. HbA1C: No results for input(s): HGBA1C in the last 72 hours. CBG: No results for input(s): GLUCAP in the last 168 hours. Lipid Profile: No results for input(s): CHOL, HDL, LDLCALC, TRIG, CHOLHDL, LDLDIRECT in the last 72 hours. Thyroid Function Tests: Recent Labs    10/06/18 1732  TSH 0.514   Anemia Panel: No results for input(s): VITAMINB12, FOLATE, FERRITIN, TIBC, IRON, RETICCTPCT in the last 72 hours. Sepsis Labs: Recent Labs  Lab 10/06/18 1656 10/06/18 1855  LATICACIDVEN 0.66 0.74    Recent Results (from the past 240 hour(s))  Group A Strep by PCR     Status: None   Collection Time: 10/06/18  6:27 PM  Result Value Ref Range Status   Group A Strep by PCR NOT DETECTED NOT DETECTED Final    Comment: Performed at [facility], [place] [address] [place], [place] [address]  Respiratory Panel by PCR     Status: None   Collection Time: 10/06/18  6:27 PM  Result Value Ref Range Status   Adenovirus NOT DETECTED NOT DETECTED Final   Coronavirus 229E NOT DETECTED NOT  DETECTED Final   Coronavirus HKU1 NOT DETECTED NOT DETECTED Final   Coronavirus NL63 NOT DETECTED NOT DETECTED Final   Coronavirus OC43 NOT DETECTED NOT DETECTED Final   Metapneumovirus NOT DETECTED NOT DETECTED Final   Rhinovirus / Enterovirus NOT DETECTED NOT DETECTED Final   Influenza A NOT DETECTED NOT DETECTED Final   Influenza B NOT DETECTED NOT DETECTED Final   Parainfluenza Virus 1 NOT DETECTED NOT DETECTED Final   Parainfluenza Virus 2 NOT DETECTED NOT DETECTED Final   Parainfluenza Virus 3 NOT DETECTED NOT DETECTED Final   Parainfluenza Virus 4 NOT DETECTED NOT DETECTED Final   Respiratory Syncytial Virus NOT DETECTED NOT DETECTED Final   Bordetella pertussis NOT DETECTED NOT DETECTED Final   Chlamydophila pneumoniae NOT DETECTED NOT DETECTED Final   Mycoplasma pneumoniae NOT DETECTED NOT DETECTED Final    Comment: Performed at [facility], [place] [address] [place], [place] [address]  Culture, blood (routine x 2)     Status: None (Preliminary result)   Collection Time: 10/06/18  6:28 PM  Result Value Ref Range Status   Specimen Description   Final    BLOOD RIGHT ANTECUBITAL Performed at [facility] [address], [place], [place] [address]    Special Requests   Final    BOTTLES DRAWN AEROBIC AND ANAEROBIC Blood Culture adequate volume Performed at [facility] [address] [place], [place] [address]    Culture   Final    NO GROWTH < 12 HOURS Performed at [facility] [address] [place], [place] [address]    Report Status PENDING  Incomplete  Culture, blood (routine x 2)     Status: None (Preliminary result)   Collection Time: 10/06/18  6:28 PM  Result Value Ref Range Status   Specimen Description   Final    BLOOD LEFT ANTECUBITAL Performed at [facility] [address], [place], [place] [address]    Special Requests   Final    BOTTLES DRAWN AEROBIC AND ANAEROBIC Blood Culture  adequate volume Performed at [facility] [address] [place], [place] [address]    Culture   Final    NO GROWTH < 12 HOURS Performed at [facility] [address], [place], [place] [address]  Report Status PENDING  Incomplete  CSF culture     Status: None (Preliminary result)   Collection Time: 10/07/18  1:43 AM  Result Value Ref Range Status   Specimen Description CSF  Final   Special Requests NONE  Final   Gram Stain   Final    NO WBC SEEN NO ORGANISMS SEEN CYTOSPIN SMEAR RESULT CALLED TO, READ BACK BY AND VERIFIED WITH: [facility] RN AT 2979 10/07/18 BY TIBBITTS,K Performed at [facility], [place] [address] [place], [place] [address]    Culture PENDING  Incomplete   Report Status PENDING  Incomplete     Radiology Studies: Ct Angio Head W Or Wo Contrast  Result Date: 10/07/2018 CLINICAL DATA:  Fever and headache. EXAM: CT ANGIOGRAPHY HEAD AND NECK TECHNIQUE: Multidetector CT imaging of the head and neck was performed using the standard protocol during bolus administration of intravenous contrast. Multiplanar CT image reconstructions and MIPs were obtained to evaluate the vascular anatomy. Carotid stenosis measurements (when applicable) are obtained utilizing NASCET criteria, using the distal internal carotid diameter as the denominator. CONTRAST:  150m ISOVUE-370 IOPAMIDOL (ISOVUE-370) INJECTION 76% COMPARISON:  None. FINDINGS: CT HEAD FINDINGS Brain: There is no mass, hemorrhage or extra-axial collection. The size and configuration of the ventricles and extra-axial CSF spaces are normal. There is no acute or chronic infarction. The brain parenchyma is normal. Skull: The visualized skull base, calvarium and extracranial soft tissues are normal. Sinuses/Orbits: No fluid levels or advanced mucosal thickening of the visualized paranasal sinuses. No mastoid or middle ear effusion. The orbits are normal. CTA NECK FINDINGS SKELETON: There  is no bony spinal canal stenosis. No lytic or blastic lesion. OTHER NECK: Normal pharynx, larynx and major salivary glands. No cervical lymphadenopathy. Unremarkable thyroid gland. UPPER CHEST: No pneumothorax or pleural effusion. No nodules or masses. AORTIC ARCH: There is no calcific atherosclerosis of the aortic arch. There is no aneurysm, dissection or hemodynamically significant stenosis of the visualized ascending aorta and aortic arch. Conventional 3 vessel aortic branching pattern. The visualized proximal subclavian arteries are widely patent. RIGHT CAROTID SYSTEM: --Common carotid artery: Widely patent origin without common carotid artery dissection or aneurysm. --Internal carotid artery: Normal without aneurysm, dissection or stenosis. --External carotid artery: No acute abnormality. LEFT CAROTID SYSTEM: --Common carotid artery: Widely patent origin without common carotid artery dissection or aneurysm. --Internal carotid artery: Normal without aneurysm, dissection or stenosis. --External carotid artery: No acute abnormality. VERTEBRAL ARTERIES: Left dominant configuration. Both origins are normal. No dissection, occlusion or flow-limiting stenosis to the vertebrobasilar confluence. CTA HEAD FINDINGS ANTERIOR CIRCULATION: --Intracranial internal carotid arteries: Normal. --Anterior cerebral arteries: Normal. Both A1 segments are present. Patent anterior communicating artery. --Middle cerebral arteries: Normal. POSTERIOR CIRCULATION: --Basilar artery: Normal. --Posterior cerebral arteries: Normal. --Superior cerebellar arteries: Normal. --Inferior cerebellar arteries: Normal anterior and posterior inferior cerebellar arteries. ANATOMIC VARIANTS: None DELAYED PHASE: No parenchymal contrast enhancement. Review of the MIP images confirms the above findings. IMPRESSION: Normal CTA of the head and neck. Electronically Signed   By: KUlyses JarredM.D.   On: 10/07/2018 02:38   Dg Chest 2 View  Result Date:  10/06/2018 CLINICAL DATA:  Fever of unknown origin. EXAM: CHEST - 2 VIEW COMPARISON:  10/04/2017 FINDINGS: The heart size and mediastinal contours are within normal limits. Both lungs are clear. The visualized skeletal structures are unremarkable. IMPRESSION: Negative.  No active cardiopulmonary disease. Electronically Signed   By: JEarle GellM.D.   On: 10/06/2018 18:27   Ct Angio Neck  W Or Wo Contrast  Result Date: 10/07/2018 CLINICAL DATA:  Fever and headache. EXAM: CT ANGIOGRAPHY HEAD AND NECK TECHNIQUE: Multidetector CT imaging of the head and neck was performed using the standard protocol during bolus administration of intravenous contrast. Multiplanar CT image reconstructions and MIPs were obtained to evaluate the vascular anatomy. Carotid stenosis measurements (when applicable) are obtained utilizing NASCET criteria, using the distal internal carotid diameter as the denominator. CONTRAST:  160m ISOVUE-370 IOPAMIDOL (ISOVUE-370) INJECTION 76% COMPARISON:  None. FINDINGS: CT HEAD FINDINGS Brain: There is no mass, hemorrhage or extra-axial collection. The size and configuration of the ventricles and extra-axial CSF spaces are normal. There is no acute or chronic infarction. The brain parenchyma is normal. Skull: The visualized skull base, calvarium and extracranial soft tissues are normal. Sinuses/Orbits: No fluid levels or advanced mucosal thickening of the visualized paranasal sinuses. No mastoid or middle ear effusion. The orbits are normal. CTA NECK FINDINGS SKELETON: There is no bony spinal canal stenosis. No lytic or blastic lesion. OTHER NECK: Normal pharynx, larynx and major salivary glands. No cervical lymphadenopathy. Unremarkable thyroid gland. UPPER CHEST: No pneumothorax or pleural effusion. No nodules or masses. AORTIC ARCH: There is no calcific atherosclerosis of the aortic arch. There is no aneurysm, dissection or hemodynamically significant stenosis of the visualized ascending aorta and  aortic arch. Conventional 3 vessel aortic branching pattern. The visualized proximal subclavian arteries are widely patent. RIGHT CAROTID SYSTEM: --Common carotid artery: Widely patent origin without common carotid artery dissection or aneurysm. --Internal carotid artery: Normal without aneurysm, dissection or stenosis. --External carotid artery: No acute abnormality. LEFT CAROTID SYSTEM: --Common carotid artery: Widely patent origin without common carotid artery dissection or aneurysm. --Internal carotid artery: Normal without aneurysm, dissection or stenosis. --External carotid artery: No acute abnormality. VERTEBRAL ARTERIES: Left dominant configuration. Both origins are normal. No dissection, occlusion or flow-limiting stenosis to the vertebrobasilar confluence. CTA HEAD FINDINGS ANTERIOR CIRCULATION: --Intracranial internal carotid arteries: Normal. --Anterior cerebral arteries: Normal. Both A1 segments are present. Patent anterior communicating artery. --Middle cerebral arteries: Normal. POSTERIOR CIRCULATION: --Basilar artery: Normal. --Posterior cerebral arteries: Normal. --Superior cerebellar arteries: Normal. --Inferior cerebellar arteries: Normal anterior and posterior inferior cerebellar arteries. ANATOMIC VARIANTS: None DELAYED PHASE: No parenchymal contrast enhancement. Review of the MIP images confirms the above findings. IMPRESSION: Normal CTA of the head and neck. Electronically Signed   By: KUlyses JarredM.D.   On: 10/07/2018 02:38   Mr BJeri CosAnd Wo Contrast  Result Date: 10/06/2018 CLINICAL DATA:  Thunderclap headache EXAM: MRI HEAD WITHOUT AND WITH CONTRAST TECHNIQUE: Multiplanar, multiecho pulse sequences of the brain and surrounding structures were obtained without and with intravenous contrast. CONTRAST:  7 mL Gadavist COMPARISON:  None. FINDINGS: BRAIN: There is no acute infarct, acute hemorrhage, hydrocephalus or extra-axial collection. The midline structures are normal. No midline  shift or other mass effect. There are no old infarcts. The white matter signal is normal for the patient's age. The cerebral and cerebellar volume are age-appropriate. Susceptibility-sensitive sequences show no chronic microhemorrhage or superficial siderosis. No abnormal contrast enhancement. VASCULAR: Major intracranial arterial and venous sinus flow voids are normal. SKULL AND UPPER CERVICAL SPINE: Calvarial bone marrow signal is normal. There is no skull base mass. Visualized upper cervical spine and soft tissues are normal. SINUSES/ORBITS: No fluid levels or advanced mucosal thickening. No mastoid or middle ear effusion. The orbits are normal. IMPRESSION: Normal brain MRI. Electronically Signed   By: KUlyses JarredM.D.   On: 10/06/2018 21:59   Ct  Venogram Head  Result Date: 10/07/2018 CLINICAL DATA:  Headache and fever EXAM: CT VENOGRAM HEAD TECHNIQUE: Following contrast administration, vena graphic images of the head were obtained. Multiplanar reconstructions were provided. CONTRAST:  139m ISOVUE-370 IOPAMIDOL (ISOVUE-370) INJECTION 76% COMPARISON:  None. FINDINGS: Superior sagittal sinus: Normal. Straight sinus: Normal. Inferior sagittal sinus, vein of Galen and internal cerebral veins: Normal. Transverse sinuses: Normal. Sigmoid sinuses: Normal. Visualized jugular veins: Normal. IMPRESSION: No venous thrombosis. Electronically Signed   By: KUlyses JarredM.D.   On: 10/07/2018 03:13    Scheduled Meds: . amLODipine  5 mg Oral Daily  . enoxaparin (LOVENOX) injection  40 mg Subcutaneous Q24H  . sodium chloride flush  3 mL Intravenous Q12H   Continuous Infusions: . sodium chloride 1,000 mL (10/07/18 1016)  . cefTRIAXone (ROCEPHIN)  IV       LOS: 0 days   SMarylu Lund MD [facility] Pager On Amion  If 7PM-7AM, please contact night-coverage 10/07/2018, 4:27 PM

## 2018-10-07 NOTE — Progress Notes (Signed)
Discussed case with Dr. Rory Percy who reviewed CSF studies and does not feel symptoms are infectious in nature.  Recommended de-escalation of antibiotic coverage as appropriate.  Will discontinue vancomycin, decrease Rocephin frequency to once daily for urinary tract infection, and continue acyclovir until HSV PCR results come back.

## 2018-10-07 NOTE — ED Notes (Signed)
Lab called to add on UDS and U-preg.

## 2018-10-07 NOTE — H&P (Addendum)
History and Physical    Kirsten Long BTD:[id number] DOB: 11-29-1985 DOA: 10/06/2018  Referring MD/NP/PA: Colen Darling,  MD  PCP: Billie Ruddy, MD  Patient coming from: PCP office  Chief Complaint: headache  I have personally briefly reviewed patient's old medical records in [place]   HPI: Kirsten Long is a 32 y.o. female with medical history significant of HTN, HSV, and HLA-B27 positive ankylosing spondylitis with iritis; who presents with complaints of a progressively worsening headache for the last 5 days.  Symptoms initially started with complains of right sided neck swelling and stiffness and then progressed into a headache.  Pain is severe and most notably in the back and front of her head.  She has had some sensitivity to light.  Denies having any recent sick contacts, vomiting, diarrhea, abdominal pain, joint pain, shortness of breath, cough, recent travel, tick exposure, changes in vision, or focal weakness.  Recently she had an induced abortion on 09/15/2018 that was reported to be uncomplicated.  Patient admits to not taking her antihypertensive medications amlodipine.  Patient had gone and seen her primary care provider earlier in the day and due to her symptoms have been advised to come to the emergency department for further evaluation.  She does not routinely get headaches and reports symptoms do not feel like previous flare of ankylosing spondylitis.    ED Course: On admission into the emergency department patient found to be febrile up to 103 F, heart rate 114-136, blood pressure up to 159/107, and other vital signs maintained.  Labs revealed WBC 10.4, sodium 134, lactic acid 0.66, and all other labs relatively within normal limits.  Influenza screen was negative.  Urinalysis was positive for moderate leukocytes, moderate hemoglobin, rare bacteria, and 11-20 WBCs.  MRI of the brain showed no acute abnormalities.  Lumbar puncture was  obtained.  Neurology Dr. Rory Percy had been consulted due to patient's symptoms and recommended CT angiogram of the head and neck with and without contrast with CT venogram.  Patient was given Toradol, Benadryl, 1 L normal saline IV fluids, and  empiric antibiotics of Rocephin and acyclovir.    Review of Systems  Constitutional: Positive for fever and malaise/fatigue.  HENT: Negative for ear discharge and nosebleeds.   Eyes: Positive for photophobia. Negative for pain.  Respiratory: Negative for cough and shortness of breath.   Cardiovascular: Negative for chest pain and leg swelling.  Gastrointestinal: Positive for nausea. Negative for abdominal pain, diarrhea and vomiting.  Genitourinary: Negative for dysuria and frequency.  Musculoskeletal: Positive for neck pain. Negative for joint pain and myalgias.  Skin: Negative for itching and rash.  Neurological: Positive for headaches. Negative for loss of consciousness.  Psychiatric/Behavioral: Negative for substance abuse. The patient is not nervous/anxious.     Past Medical History:  Diagnosis Date  . Arthritis   . HSV (herpes simplex virus) infection   . Hypertension   . Infection    Valtrex for HSV 2009   . Urinary tract infection     Past Surgical History:  Procedure Laterality Date  . CHOLECYSTECTOMY  10/15/2012   Procedure: LAPAROSCOPIC CHOLECYSTECTOMY WITH INTRAOPERATIVE CHOLANGIOGRAM;  Surgeon: Shann Medal, MD;  Location: [facility];  Service: General;  Laterality: N/A;     reports that she quit smoking about 6 years ago. She quit after 2.00 years of use. She has never used smokeless tobacco. She reports current alcohol use. She reports that she does not use drugs.  No Known Allergies  Family History  Problem Relation Age of Onset  . Hypertension Mother   . Hypertension Maternal Aunt   . Hypertension Maternal Uncle   . Cancer Maternal Grandmother   . Hypertension Maternal Grandmother   . Hypertension Maternal Grandfather     . Hypertension Paternal Grandmother   . Stroke Paternal Grandmother   . Hypertension Paternal Grandfather   . Stroke Paternal Grandfather   . Hypertension Father     Prior to Admission medications   Medication Sig Start Date End Date Taking? Authorizing Provider  amLODipine (NORVASC) 5 MG tablet Take 1 tablet (5 mg total) by mouth daily. 10/09/17  Yes Billie Ruddy, MD  aspirin-acetaminophen-caffeine (EXCEDRIN MIGRAINE) [id number] MG tablet Take 1 tablet by mouth every 6 (six) hours as needed for headache or migraine.   Yes [provider]  potassium chloride SA (K-DUR,KLOR-CON) 20 MEQ tablet Take 1 tablet (20 mEq total) by mouth daily. Patient not taking: Reported on 10/06/2018 10/09/17   Billie Ruddy, MD  sulfamethoxazole-trimethoprim (BACTRIM DS,SEPTRA DS) 800-160 MG tablet Take 1 tablet by mouth 2 (two) times daily for 5 days. 10/06/18 10/11/18  Billie Ruddy, MD    Physical Exam:  Constitutional: Female who appears to be acutely ill, but nontoxic in appearance Vitals:   10/06/18 1911 10/06/18 1912 10/06/18 2054 10/06/18 2250  BP:   (!) 146/96 (!) 154/96  Pulse: (!) 115 (!) 114 (!) 114 (!) 125  Resp:   18 20  Temp:    100.2 F (37.9 C)  TempSrc:    Oral  SpO2: 97% 97% 97% 98%  Weight:      Height:       Eyes: PERRL, lids and conjunctivae normal ENMT: Mucous membranes are dry. Posterior pharynx clear of any exudate or lesions.  Neck: Mild tenderness palpation of the lateral aspect of the right side of the neck. Respiratory: clear to auscultation bilaterally, no wheezing, no crackles. Normal respiratory effort. No accessory muscle use.  Cardiovascular: Tachycardic, no murmurs / rubs / gallops. No extremity edema. 2+ pedal pulses. No carotid bruits.  Abdomen: no tenderness, no masses palpated. No hepatosplenomegaly. Bowel sounds positive.  Musculoskeletal: no clubbing / cyanosis. No joint deformity upper and lower extremities. Good ROM, no contractures.  Normal muscle tone.  Skin: no rashes, lesions, ulcers. No induration Neurologic: CN 2-12 grossly intact. Sensation intact, DTR normal. Strength 5/5 in all 4.  Psychiatric: Normal judgment and insight. Alert and oriented x 3. Normal mood.     Labs on Admission: I have personally reviewed following labs and imaging studies  CBC: Recent Labs  Lab 10/06/18 1649  WBC 10.4  NEUTROABS 8.3*  HGB 13.1  HCT 40.0  MCV 89.9  PLT 030   Basic Metabolic Panel: Recent Labs  Lab 10/06/18 1649  NA 134*  K 3.5  CL 99  CO2 26  GLUCOSE 100*  BUN 9  CREATININE 0.93  CALCIUM 9.2   GFR: Estimated Creatinine Clearance: 85.2 mL/min (by C-G formula based on SCr of 0.93 mg/dL). Liver Function Tests: Recent Labs  Lab 10/06/18 1649  AST 18  ALT 13  ALKPHOS 64  BILITOT 0.9  PROT 8.4*  ALBUMIN 4.3   No results for input(s): LIPASE, AMYLASE in the last 168 hours. No results for input(s): AMMONIA in the last 168 hours. Coagulation Profile: No results for input(s): INR, PROTIME in the last 168 hours. Cardiac Enzymes: No results for input(s): CKTOTAL, CKMB, CKMBINDEX, TROPONINI in the last 168 hours. BNP (last 3 results)  No results for input(s): PROBNP in the last 8760 hours. HbA1C: No results for input(s): HGBA1C in the last 72 hours. CBG: No results for input(s): GLUCAP in the last 168 hours. Lipid Profile: No results for input(s): CHOL, HDL, LDLCALC, TRIG, CHOLHDL, LDLDIRECT in the last 72 hours. Thyroid Function Tests: No results for input(s): TSH, T4TOTAL, FREET4, T3FREE, THYROIDAB in the last 72 hours. Anemia Panel: No results for input(s): VITAMINB12, FOLATE, FERRITIN, TIBC, IRON, RETICCTPCT in the last 72 hours. Urine analysis:    Component Value Date/Time   COLORURINE YELLOW 10/06/2018 [place] 10/06/2018 1724   LABSPEC 1.021 10/06/2018 1724   PHURINE 7.0 10/06/2018 1724   GLUCOSEU NEGATIVE 10/06/2018 1724   HGBUR MODERATE (A) 10/06/2018 1724    BILIRUBINUR NEGATIVE 10/06/2018 1724   BILIRUBINUR neg 10/06/2018 1625   KETONESUR 5 (A) 10/06/2018 1724   PROTEINUR NEGATIVE 10/06/2018 1724   UROBILINOGEN 0.2 10/06/2018 1625   UROBILINOGEN 1.0 03/18/2015 1826   NITRITE NEGATIVE 10/06/2018 1724   LEUKOCYTESUR MODERATE (A) 10/06/2018 1724   Sepsis Labs: Recent Results (from the past 240 hour(s))  Group A Strep by PCR     Status: None   Collection Time: 10/06/18  6:27 PM  Result Value Ref Range Status   Group A Strep by PCR NOT DETECTED NOT DETECTED Final    Comment: Performed at [facility], [place] [address], [place], [place] [address]     Radiological Exams on Admission: Dg Chest 2 View  Result Date: 10/06/2018 CLINICAL DATA:  Fever of unknown origin. EXAM: CHEST - 2 VIEW COMPARISON:  10/04/2017 FINDINGS: The heart size and mediastinal contours are within normal limits. Both lungs are clear. The visualized skeletal structures are unremarkable. IMPRESSION: Negative.  No active cardiopulmonary disease. Electronically Signed   By: Earle Gell M.D.   On: 10/06/2018 18:27   Mr Brain W And Wo Contrast  Result Date: 10/06/2018 CLINICAL DATA:  Thunderclap headache EXAM: MRI HEAD WITHOUT AND WITH CONTRAST TECHNIQUE: Multiplanar, multiecho pulse sequences of the brain and surrounding structures were obtained without and with intravenous contrast. CONTRAST:  7 mL Gadavist COMPARISON:  None. FINDINGS: BRAIN: There is no acute infarct, acute hemorrhage, hydrocephalus or extra-axial collection. The midline structures are normal. No midline shift or other mass effect. There are no old infarcts. The white matter signal is normal for the patient's age. The cerebral and cerebellar volume are age-appropriate. Susceptibility-sensitive sequences show no chronic microhemorrhage or superficial siderosis. No abnormal contrast enhancement. VASCULAR: Major intracranial arterial and venous sinus flow voids are normal. SKULL AND UPPER CERVICAL  SPINE: Calvarial bone marrow signal is normal. There is no skull base mass. Visualized upper cervical spine and soft tissues are normal. SINUSES/ORBITS: No fluid levels or advanced mucosal thickening. No mastoid or middle ear effusion. The orbits are normal. IMPRESSION: Normal brain MRI. Electronically Signed   By: Ulyses Jarred M.D.   On: 10/06/2018 21:59    EKG: Independently reviewed.  Sinus tachycardia 134 bpm  Assessment/Plan Sepsis secondary to urinary tract infection/question possible CNS infection: Acute.  Patient presents with fever up to 103 F with tachycardia and white blood cell count at the upper limit of normal.  Lactic acid was reassuring at 0.66.  Urinalysis was positive for signs of infection.  However, question possible other source as patient presenting symptoms centered around headache.  MRI maging of the brain showed no acute abnormalities.  Neurology have been consulted due to patient symptoms.  Patient had been empirically started  on Rocephin and azithromycin due to history of HSV. - Admit to a telemetry bed - Follow-up blood and CSF cultures/studies - Check ESR, CRP, TSH - F/u CT angiograms and venogram of the head and neck - Empiric antibiotics of Rocephin, vancomycin, and acyclovir for now - Give 10 mg of Decadron IV - Tylenol as needed for fever - Appreciate neurology consultative services will follow up with further recommendations  Headache and right-sided neck pain: Patient reports having severe headache.  Question uncontrolled blood pressure as possible cause of symptoms.  Toradol and Benadryl provided temporary relief.  - Fioricet as needed for headache  Essential hypertension: Uncontrolled.  Patient's blood pressures elevated up to 159/107 on admission.  Patient reports noncompliance with amlodipine. - Continue amlodipine - Counseled on need of compliance with antihypertensive medication   History of ankylosing spondylitis: Patient followed by rheumatology for  symptoms.  She reports right-sided neck swelling, but does not report similar symptoms with previous flares in the past. - Follow-up ESR and CT imaging of the neck - Continue outpatient follow-up   History of HSV   DVT prophylaxis: Lovenox Code Status: Full Family Communication: Discussed plan of care with patient and family present at bedside Disposition Plan: Likely discharge home in 1 to 2 days if work-up negative Consults called: Neurology Admission status: observation  Norval Morton MD [facility] Pager [phone]   If 7PM-7AM, please contact night-coverage [email or web address] Password [facility]  10/07/2018, 12:33 AM

## 2018-10-07 NOTE — Progress Notes (Signed)
Respiratory p[anel and influenza panel all were negative swabs. Droplet prec discontinued. SRP, RN

## 2018-10-07 NOTE — Progress Notes (Signed)
Received pt from the ED in stable condition. TELE placed and verified, pt c/o of headache, medicated as ordered. IV patent. Family at bedside. Will cont to monitor. SRP, RN

## 2018-10-07 NOTE — ED Notes (Signed)
ED TO INPATIENT HANDOFF REPORT  Name/Age/Gender Kirsten Long 32 y.o. female  Code Status    Code Status Orders  (From admission, onward)         Start     Ordered   10/07/18 0125  Full code  Continuous     10/07/18 0131        Code Status History    Date Active Date Inactive Code Status Order ID Comments User Context   09/22/2012 1608 09/24/2012 1421 Full Code [id number]  Havery Moros, RN Inpatient   09/22/2012 0721 09/22/2012 1545 Full Code [id number]  Helene Kelp, RN Inpatient      Home/SNF/Other Home  Chief Complaint Abnormal Labs   Level of Care/Admitting Diagnosis ED Disposition    ED Disposition Condition [place]: [facility] [[id number]]  Level of Care: Telemetry [5]  Admit to tele based on following criteria: Complex arrhythmia (Bradycardia/Tachycardia)  Diagnosis: Sepsis [facility]) [[id number]]  Admitting Physician: Norval Morton [[id number]]  Attending Physician: Norval Morton [[id number]]  PT Class (Do Not Modify): Observation [104]  PT Acc Code (Do Not Modify): Observation [10022]       Medical History Past Medical History:  Diagnosis Date  . Arthritis   . HSV (herpes simplex virus) infection   . Hypertension   . Infection    Valtrex for HSV 2009   . Urinary tract infection     Allergies No Known Allergies  IV Location/Drains/Wounds Patient Lines/Drains/Airways Status   Active Line/Drains/Airways    Name:   Placement date:   Placement time:   Site:   Days:   Peripheral IV 10/06/18 Right Antecubital   10/06/18    1730    Antecubital   1          Labs/Imaging Results for orders placed or performed during the hospital encounter of 10/06/18 (from the past 48 hour(s))  Comprehensive metabolic panel     Status: Abnormal   Collection Time: 10/06/18  4:49 PM  Result Value Ref Range   Sodium 134 (L) 135 - 145 mmol/L   Potassium 3.5 3.5 - 5.1 mmol/L   Chloride 99 98 - 111 mmol/L    CO2 26 22 - 32 mmol/L   Glucose, Bld 100 (H) 70 - 99 mg/dL   BUN 9 6 - 20 mg/dL   Creatinine, Ser 0.93 0.44 - 1.00 mg/dL   Calcium 9.2 8.9 - 10.3 mg/dL   Total Protein 8.4 (H) 6.5 - 8.1 g/dL   Albumin 4.3 3.5 - 5.0 g/dL   AST 18 15 - 41 U/L   ALT 13 0 - 44 U/L   Alkaline Phosphatase 64 38 - 126 U/L   Total Bilirubin 0.9 0.3 - 1.2 mg/dL   GFR calc non Af Amer >60 >60 mL/min   GFR calc Af Amer >60 >60 mL/min   Anion gap 9 5 - 15    Comment: Performed at [facility], [place] [address] [place], [place] [address]  CBC with Differential     Status: Abnormal   Collection Time: 10/06/18  4:49 PM  Result Value Ref Range   WBC 10.4 4.0 - 10.5 K/uL   RBC 4.45 3.87 - 5.11 MIL/uL   Hemoglobin 13.1 12.0 - 15.0 g/dL   HCT 40.0 36.0 - 46.0 %   MCV 89.9 80.0 - 100.0 fL   MCH 29.4 26.0 - 34.0 pg   MCHC 32.8 30.0 - 36.0 g/dL   RDW  13.8 11.5 - 15.5 %   Platelets 398 150 - 400 K/uL   nRBC 0.0 0.0 - 0.2 %   Neutrophils Relative % 81 %   Neutro Abs 8.3 (H) 1.7 - 7.7 K/uL   Lymphocytes Relative 10 %   Lymphs Abs 1.1 0.7 - 4.0 K/uL   Monocytes Relative 9 %   Monocytes Absolute 0.9 0.1 - 1.0 K/uL   Eosinophils Relative 0 %   Eosinophils Absolute 0.0 0.0 - 0.5 K/uL   Basophils Relative 0 %   Basophils Absolute 0.0 0.0 - 0.1 K/uL   Immature Granulocytes 0 %   Abs Immature Granulocytes 0.04 0.00 - 0.07 K/uL    Comment: Performed at [facility], [place] [address], [place], [place] [address]  I-Stat CG4 Lactic Acid, ED     Status: None   Collection Time: 10/06/18  4:56 PM  Result Value Ref Range   Lactic Acid, Venous 0.66 0.5 - 1.9 mmol/L  I-Stat beta hCG blood, ED     Status: Abnormal   Collection Time: 10/06/18  5:06 PM  Result Value Ref Range   I-stat hCG, quantitative 53.3 (H) <5 mIU/mL   Comment 3            Comment:   GEST. AGE      CONC.  (mIU/mL)   <=1 WEEK        5 - 50     2 WEEKS       50 - 500     3 WEEKS       100 - 10,000     4 WEEKS      1,000 - 30,000        FEMALE AND NON-PREGNANT FEMALE:     LESS THAN 5 mIU/mL   Urinalysis, Routine w reflex microscopic     Status: Abnormal   Collection Time: 10/06/18  5:24 PM  Result Value Ref Range   Color, Urine YELLOW YELLOW   APPearance CLEAR CLEAR   Specific Gravity, Urine 1.021 1.005 - 1.030   pH 7.0 5.0 - 8.0   Glucose, UA NEGATIVE NEGATIVE mg/dL   Hgb urine dipstick MODERATE (A) NEGATIVE   Bilirubin Urine NEGATIVE NEGATIVE   Ketones, ur 5 (A) NEGATIVE mg/dL   Protein, ur NEGATIVE NEGATIVE mg/dL   Nitrite NEGATIVE NEGATIVE   Leukocytes, UA MODERATE (A) NEGATIVE   RBC / HPF 11-20 0 - 5 RBC/hpf   WBC, UA 11-20 0 - 5 WBC/hpf   Bacteria, UA RARE (A) NONE SEEN   Squamous Epithelial / LPF 0-5 0 - 5   Mucus PRESENT     Comment: Performed at [facility], [place] [address], [place], [place] [address]  Pregnancy, urine     Status: Abnormal   Collection Time: 10/06/18  5:24 PM  Result Value Ref Range   Preg Test, Ur POSITIVE (A) NEGATIVE    Comment:        THE SENSITIVITY OF THIS METHODOLOGY IS >20 mIU/mL. Performed at [facility], [place] [address] [place], [place] [address]   Urine rapid drug screen (hosp performed)     Status: None   Collection Time: 10/06/18  5:24 PM  Result Value Ref Range   Opiates NONE DETECTED NONE DETECTED   Cocaine NONE DETECTED NONE DETECTED   Benzodiazepines NONE DETECTED NONE DETECTED   Amphetamines NONE DETECTED NONE DETECTED   Tetrahydrocannabinol NONE DETECTED NONE DETECTED   Barbiturates NONE DETECTED NONE DETECTED    Comment: (  NOTE) DRUG SCREEN FOR MEDICAL PURPOSES ONLY.  IF CONFIRMATION IS NEEDED FOR ANY PURPOSE, NOTIFY LAB WITHIN 5 DAYS. LOWEST DETECTABLE LIMITS FOR URINE DRUG SCREEN Drug Class                     Cutoff (ng/mL) Amphetamine and metabolites    1000 Barbiturate and metabolites    200 Benzodiazepine                 956 Tricyclics and metabolites     300 Opiates and metabolites         300 Cocaine and metabolites        300 THC                            50 Performed at [facility], [place] [address] [place], [place] [address]   Sedimentation rate     Status: Abnormal   Collection Time: 10/06/18  5:32 PM  Result Value Ref Range   Sed Rate 39 (H) 0 - 22 mm/hr    Comment: Performed at [facility], [place] [address], [place], [place] [address]  C-reactive protein     Status: Abnormal   Collection Time: 10/06/18  5:32 PM  Result Value Ref Range   CRP 9.5 (H) <1.0 mg/dL    Comment: Performed at [facility], [place] [address] [place], [place] [address]  TSH     Status: None   Collection Time: 10/06/18  5:32 PM  Result Value Ref Range   TSH 0.514 0.350 - 4.500 uIU/mL    Comment: Performed by a 3rd Generation assay with a functional sensitivity of <=0.01 uIU/mL. Performed at [facility], [place] [address], [place], [place] [address]   Influenza panel by PCR (type A & B)     Status: None   Collection Time: 10/06/18  6:27 PM  Result Value Ref Range   Influenza A By PCR NEGATIVE NEGATIVE   Influenza B By PCR NEGATIVE NEGATIVE    Comment: (NOTE) The Xpert Xpress Flu assay is intended as an aid in the diagnosis of  influenza and should not be used as a sole basis for treatment.  This  assay is FDA approved for nasopharyngeal swab specimens only. Nasal  washings and aspirates are unacceptable for Xpert Xpress Flu testing. Performed at [facility], [place] [address], [place], [place] [address]   Group A Strep by PCR     Status: None   Collection Time: 10/06/18  6:27 PM  Result Value Ref Range   Group A Strep by PCR NOT DETECTED NOT DETECTED    Comment: Performed at [facility], [place] [address], [place], [place] [address]  Culture, blood (routine x 2)     Status: None (Preliminary result)   Collection Time: 10/06/18  6:28 PM  Result Value Ref Range   Specimen  Description      BLOOD RIGHT ANTECUBITAL Performed at [facility], [place] [address], [place], [place] [address]    Special Requests      BOTTLES DRAWN AEROBIC AND ANAEROBIC Blood Culture adequate volume Performed at [place] [address], [place], [place] [address]    Culture      NO GROWTH < 12 HOURS Performed at [place] [address], [place], [place] [address]    Report Status PENDING   Culture,  blood (routine x 2)     Status: None (Preliminary result)   Collection Time: 10/06/18  6:28 PM  Result Value Ref Range   Specimen Description      BLOOD LEFT ANTECUBITAL Performed at [facility], [place] [address], [place], [place] [address]    Special Requests      BOTTLES DRAWN AEROBIC AND ANAEROBIC Blood Culture adequate volume Performed at [place] [address], [place], [place] [address]    Culture      NO GROWTH < 12 HOURS Performed at [place] [address] [place], [place] [address]    Report Status PENDING   I-Stat CG4 Lactic Acid, ED     Status: None   Collection Time: 10/06/18  6:55 PM  Result Value Ref Range   Lactic Acid, Venous 0.74 0.5 - 1.9 mmol/L  CSF cell count with differential collection tube #: 1     Status: Abnormal   Collection Time: 10/07/18  1:43 AM  Result Value Ref Range   Tube # 1    Color, CSF COLORLESS COLORLESS   Appearance, CSF CLEAR (A) CLEAR   Supernatant NOT INDICATED    RBC Count, CSF 12 (H) 0 /cu mm   WBC, CSF 3 0 - 5 /cu mm   Other Cells, CSF TOO FEW TO COUNT, SMEAR AVAILABLE FOR REVIEW     Comment: RARE LYMPHOCYTES AND RARE MONOCYTES Performed at [place] [address] [place], [place] [address]   CSF culture     Status: None (Preliminary result)   Collection Time: 10/07/18  1:43 AM  Result Value Ref Range   Specimen Description CSF    Special Requests NONE    Gram Stain      NO WBC SEEN NO  ORGANISMS SEEN CYTOSPIN SMEAR RESULT CALLED TO, READ BACK BY AND VERIFIED WITH: [facility] RN AT 8786 10/07/18 BY TIBBITTS,K Performed at [facility], [place] [address], [place], [place] [address]    Culture PENDING    Report Status PENDING   Protein and glucose, CSF     Status: None   Collection Time: 10/07/18  1:43 AM  Result Value Ref Range   Glucose, CSF 54 40 - 70 mg/dL   Total  Protein, CSF 23 15 - 45 mg/dL    Comment: Performed at [facility], [place] [address] [place], [place] [address]  CSF cell count with differential     Status: None   Collection Time: 10/07/18  1:43 AM  Result Value Ref Range   Tube # 4    Color, CSF COLORLESS COLORLESS   Appearance, CSF CLEAR CLEAR   Supernatant NOT INDICATED    RBC Count, CSF 0 0 /cu mm   WBC, CSF 1 0 - 5 /cu mm   Other Cells, CSF TOO FEW TO COUNT, SMEAR AVAILABLE FOR REVIEW     Comment: RARE LYMPHOCYTES Performed at [place] [address], [place], [place] [address]   CBC     Status: Abnormal   Collection Time: 10/07/18  5:00 AM  Result Value Ref Range   WBC 17.3 (H) 4.0 - 10.5 K/uL   RBC 3.91 3.87 - 5.11 MIL/uL   Hemoglobin 11.7 (L) 12.0 - 15.0 g/dL   HCT 35.4 (L) 36.0 - 46.0 %   MCV 90.5 80.0 - 100.0 fL   MCH 29.9 26.0 - 34.0 pg   MCHC 33.1 30.0 - 36.0 g/dL  RDW 13.9 11.5 - 15.5 %   Platelets 360 150 - 400 K/uL   nRBC 0.0 0.0 - 0.2 %    Comment: Performed at [facility], [place] [address], [place], [place] [address]  Basic metabolic panel     Status: Abnormal   Collection Time: 10/07/18  5:00 AM  Result Value Ref Range   Sodium 136 135 - 145 mmol/L   Potassium 3.4 (L) 3.5 - 5.1 mmol/L   Chloride 102 98 - 111 mmol/L   CO2 23 22 - 32 mmol/L   Glucose, Bld 143 (H) 70 - 99 mg/dL   BUN 7 6 - 20 mg/dL   Creatinine, Ser 0.93 0.44 - 1.00 mg/dL   Calcium 8.2 (L) 8.9 - 10.3 mg/dL   GFR calc non Af Amer >60 >60 mL/min   GFR calc Af Amer >60 >60  mL/min   Anion gap 11 5 - 15    Comment: Performed at [facility], [place] [address], [place], [place] [address]   Ct Angio Head W Or Wo Contrast  Result Date: 10/07/2018 CLINICAL DATA:  Fever and headache. EXAM: CT ANGIOGRAPHY HEAD AND NECK TECHNIQUE: Multidetector CT imaging of the head and neck was performed using the standard protocol during bolus administration of intravenous contrast. Multiplanar CT image reconstructions and MIPs were obtained to evaluate the vascular anatomy. Carotid stenosis measurements (when applicable) are obtained utilizing NASCET criteria, using the distal internal carotid diameter as the denominator. CONTRAST:  167mL ISOVUE-370 IOPAMIDOL (ISOVUE-370) INJECTION 76% COMPARISON:  None. FINDINGS: CT HEAD FINDINGS Brain: There is no mass, hemorrhage or extra-axial collection. The size and configuration of the ventricles and extra-axial CSF spaces are normal. There is no acute or chronic infarction. The brain parenchyma is normal. Skull: The visualized skull base, calvarium and extracranial soft tissues are normal. Sinuses/Orbits: No fluid levels or advanced mucosal thickening of the visualized paranasal sinuses. No mastoid or middle ear effusion. The orbits are normal. CTA NECK FINDINGS SKELETON: There is no bony spinal canal stenosis. No lytic or blastic lesion. OTHER NECK: Normal pharynx, larynx and major salivary glands. No cervical lymphadenopathy. Unremarkable thyroid gland. UPPER CHEST: No pneumothorax or pleural effusion. No nodules or masses. AORTIC ARCH: There is no calcific atherosclerosis of the aortic arch. There is no aneurysm, dissection or hemodynamically significant stenosis of the visualized ascending aorta and aortic arch. Conventional 3 vessel aortic branching pattern. The visualized proximal subclavian arteries are widely patent. RIGHT CAROTID SYSTEM: --Common carotid artery: Widely patent origin without common carotid artery dissection or  aneurysm. --Internal carotid artery: Normal without aneurysm, dissection or stenosis. --External carotid artery: No acute abnormality. LEFT CAROTID SYSTEM: --Common carotid artery: Widely patent origin without common carotid artery dissection or aneurysm. --Internal carotid artery: Normal without aneurysm, dissection or stenosis. --External carotid artery: No acute abnormality. VERTEBRAL ARTERIES: Left dominant configuration. Both origins are normal. No dissection, occlusion or flow-limiting stenosis to the vertebrobasilar confluence. CTA HEAD FINDINGS ANTERIOR CIRCULATION: --Intracranial internal carotid arteries: Normal. --Anterior cerebral arteries: Normal. Both A1 segments are present. Patent anterior communicating artery. --Middle cerebral arteries: Normal. POSTERIOR CIRCULATION: --Basilar artery: Normal. --Posterior cerebral arteries: Normal. --Superior cerebellar arteries: Normal. --Inferior cerebellar arteries: Normal anterior and posterior inferior cerebellar arteries. ANATOMIC VARIANTS: None DELAYED PHASE: No parenchymal contrast enhancement. Review of the MIP images confirms the above findings. IMPRESSION: Normal CTA of the head and neck. Electronically Signed   By: Ulyses Jarred M.D.   On: 10/07/2018 02:38   Dg Chest 2 View  Result Date: 10/06/2018 CLINICAL DATA:  Fever of unknown origin. EXAM: CHEST - 2 VIEW COMPARISON:  10/04/2017 FINDINGS: The heart size and mediastinal contours are within normal limits. Both lungs are clear. The visualized skeletal structures are unremarkable. IMPRESSION: Negative.  No active cardiopulmonary disease. Electronically Signed   By: Earle Gell M.D.   On: 10/06/2018 18:27   Ct Angio Neck W Or Wo Contrast  Result Date: 10/07/2018 CLINICAL DATA:  Fever and headache. EXAM: CT ANGIOGRAPHY HEAD AND NECK TECHNIQUE: Multidetector CT imaging of the head and neck was performed using the standard protocol during bolus administration of intravenous contrast. Multiplanar CT  image reconstructions and MIPs were obtained to evaluate the vascular anatomy. Carotid stenosis measurements (when applicable) are obtained utilizing NASCET criteria, using the distal internal carotid diameter as the denominator. CONTRAST:  132mL ISOVUE-370 IOPAMIDOL (ISOVUE-370) INJECTION 76% COMPARISON:  None. FINDINGS: CT HEAD FINDINGS Brain: There is no mass, hemorrhage or extra-axial collection. The size and configuration of the ventricles and extra-axial CSF spaces are normal. There is no acute or chronic infarction. The brain parenchyma is normal. Skull: The visualized skull base, calvarium and extracranial soft tissues are normal. Sinuses/Orbits: No fluid levels or advanced mucosal thickening of the visualized paranasal sinuses. No mastoid or middle ear effusion. The orbits are normal. CTA NECK FINDINGS SKELETON: There is no bony spinal canal stenosis. No lytic or blastic lesion. OTHER NECK: Normal pharynx, larynx and major salivary glands. No cervical lymphadenopathy. Unremarkable thyroid gland. UPPER CHEST: No pneumothorax or pleural effusion. No nodules or masses. AORTIC ARCH: There is no calcific atherosclerosis of the aortic arch. There is no aneurysm, dissection or hemodynamically significant stenosis of the visualized ascending aorta and aortic arch. Conventional 3 vessel aortic branching pattern. The visualized proximal subclavian arteries are widely patent. RIGHT CAROTID SYSTEM: --Common carotid artery: Widely patent origin without common carotid artery dissection or aneurysm. --Internal carotid artery: Normal without aneurysm, dissection or stenosis. --External carotid artery: No acute abnormality. LEFT CAROTID SYSTEM: --Common carotid artery: Widely patent origin without common carotid artery dissection or aneurysm. --Internal carotid artery: Normal without aneurysm, dissection or stenosis. --External carotid artery: No acute abnormality. VERTEBRAL ARTERIES: Left dominant configuration. Both  origins are normal. No dissection, occlusion or flow-limiting stenosis to the vertebrobasilar confluence. CTA HEAD FINDINGS ANTERIOR CIRCULATION: --Intracranial internal carotid arteries: Normal. --Anterior cerebral arteries: Normal. Both A1 segments are present. Patent anterior communicating artery. --Middle cerebral arteries: Normal. POSTERIOR CIRCULATION: --Basilar artery: Normal. --Posterior cerebral arteries: Normal. --Superior cerebellar arteries: Normal. --Inferior cerebellar arteries: Normal anterior and posterior inferior cerebellar arteries. ANATOMIC VARIANTS: None DELAYED PHASE: No parenchymal contrast enhancement. Review of the MIP images confirms the above findings. IMPRESSION: Normal CTA of the head and neck. Electronically Signed   By: Ulyses Jarred M.D.   On: 10/07/2018 02:38   Mr Jeri Cos And Wo Contrast  Result Date: 10/06/2018 CLINICAL DATA:  Thunderclap headache EXAM: MRI HEAD WITHOUT AND WITH CONTRAST TECHNIQUE: Multiplanar, multiecho pulse sequences of the brain and surrounding structures were obtained without and with intravenous contrast. CONTRAST:  7 mL Gadavist COMPARISON:  None. FINDINGS: BRAIN: There is no acute infarct, acute hemorrhage, hydrocephalus or extra-axial collection. The midline structures are normal. No midline shift or other mass effect. There are no old infarcts. The white matter signal is normal for the patient's age. The cerebral and cerebellar volume are age-appropriate. Susceptibility-sensitive sequences show no chronic microhemorrhage or superficial siderosis. No abnormal contrast enhancement. VASCULAR: Major intracranial arterial and venous sinus flow voids are normal.  SKULL AND UPPER CERVICAL SPINE: Calvarial bone marrow signal is normal. There is no skull base mass. Visualized upper cervical spine and soft tissues are normal. SINUSES/ORBITS: No fluid levels or advanced mucosal thickening. No mastoid or middle ear effusion. The orbits are normal. IMPRESSION:  Normal brain MRI. Electronically Signed   By: Ulyses Jarred M.D.   On: 10/06/2018 21:59   Ct Venogram Head  Result Date: 10/07/2018 CLINICAL DATA:  Headache and fever EXAM: CT VENOGRAM HEAD TECHNIQUE: Following contrast administration, vena graphic images of the head were obtained. Multiplanar reconstructions were provided. CONTRAST:  179mL ISOVUE-370 IOPAMIDOL (ISOVUE-370) INJECTION 76% COMPARISON:  None. FINDINGS: Superior sagittal sinus: Normal. Straight sinus: Normal. Inferior sagittal sinus, vein of Galen and internal cerebral veins: Normal. Transverse sinuses: Normal. Sigmoid sinuses: Normal. Visualized jugular veins: Normal. IMPRESSION: No venous thrombosis. Electronically Signed   By: Ulyses Jarred M.D.   On: 10/07/2018 03:13   None  Pending Labs Unresulted Labs (From admission, onward)    Start     Ordered   10/07/18 0500  HIV antibody (Routine Testing)  Tomorrow morning,   R     10/07/18 0131   10/07/18 0123  Respiratory Panel by PCR  (Respiratory virus panel with precautions)  Once,   R     10/07/18 0122   10/07/18 0042  Herpes simplex virus (HSV), DNA by PCR Cerebrospinal Fluid  Once,   R     10/07/18 0041          Vitals/Pain Today's Vitals   10/07/18 0800 10/07/18 0900 10/07/18 0927 10/07/18 1019  BP: 109/80 104/75  119/84  Pulse: 95 92  95  Resp: 17 19  18   Temp:   98.7 F (37.1 C)   TempSrc:   Oral   SpO2: 99% 99%  98%  Weight:      Height:      PainSc:        Isolation Precautions Droplet precaution  Medications Medications  sodium chloride 0.9 % bolus 1,000 mL (0 mLs Intravenous Stopped 10/06/18 1932)    Followed by  0.9 %  sodium chloride infusion (1,000 mLs Intravenous New Bag/Given 10/07/18 1016)  amLODipine (NORVASC) tablet 5 mg (5 mg Oral Given 10/07/18 1013)  enoxaparin (LOVENOX) injection 40 mg (has no administration in time range)  sodium chloride flush (NS) 0.9 % injection 3 mL (3 mLs Intravenous Given 10/07/18 1014)  sodium chloride (PF)  0.9 % injection (has no administration in time range)  butalbital-acetaminophen-caffeine (FIORICET, ESGIC) 50-325-40 MG per tablet 1 tablet (has no administration in time range)  ketorolac (TORADOL) 15 MG/ML injection 15 mg (has no administration in time range)  diphenhydrAMINE (BENADRYL) injection 25 mg (has no administration in time range)  acetaminophen (TYLENOL) tablet 650 mg (has no administration in time range)  hydrALAZINE (APRESOLINE) injection 10 mg (has no administration in time range)  cefTRIAXone (ROCEPHIN) 2 g in sodium chloride 0.9 % 100 mL IVPB (has no administration in time range)  acetaminophen (TYLENOL) tablet 650 mg (650 mg Oral Given 10/06/18 1720)  ketorolac (TORADOL) 30 MG/ML injection 30 mg (30 mg Intravenous Given 10/06/18 1858)  gadobutrol (GADAVIST) 1 MMOL/ML injection 7.5 mL (7 mLs Intravenous Contrast Given 10/06/18 2149)  ketorolac (TORADOL) 30 MG/ML injection 30 mg (30 mg Intravenous Given 10/07/18 0123)  metoCLOPramide (REGLAN) injection 10 mg (10 mg Intravenous Given 10/07/18 0125)  diphenhydrAMINE (BENADRYL) injection 25 mg (25 mg Intravenous Given 10/07/18 0127)  lactated ringers bolus 1,000 mL (0 mLs Intravenous Stopped 10/07/18 0152)  acyclovir (ZOVIRAX) 780 mg in dextrose 5 % 150 mL IVPB (0 mg Intravenous Stopped 10/07/18 0339)  lidocaine (PF) (XYLOCAINE) 1 % injection (  Given by Other 10/07/18 0140)  dexamethasone (DECADRON) injection 10 mg (10 mg Intravenous Given 10/07/18 0231)  vancomycin (VANCOCIN) 1,500 mg in sodium chloride 0.9 % 500 mL IVPB (0 mg Intravenous Stopped 10/07/18 0614)  iopamidol (ISOVUE-370) 76 % injection 100 mL (100 mLs Intravenous Contrast Given 10/07/18 0200)  acetaminophen (TYLENOL) tablet 1,000 mg (1,000 mg Oral Given 10/07/18 0226)  potassium chloride (K-DUR,KLOR-CON) CR tablet 30 mEq (30 mEq Oral Given 10/07/18 0639)    Mobility Walks with person assist

## 2018-10-07 NOTE — ED Notes (Signed)
Patient transported to CT 

## 2018-10-07 NOTE — ED Notes (Signed)
Pt placed on an inpatient hospital bed for comfort. 

## 2018-10-07 NOTE — ED Notes (Signed)
Bed: WA27 Expected date:  Expected time:  Means of arrival:  Comments: Hold for room 5

## 2018-10-07 NOTE — Consult Note (Addendum)
Neurology Consultation  Reason for Consult:headache, fever Referring Physician: Dr. Vallery Ridge  CC: Headache fever  History is obtained from: Patient, chart  HPI: Kirsten Long is a 32 y.o. 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 She reports that she was in her usual state of health till Friday when she started feeling some discomfort around her right neck and had difficulty moving her neck which progressed into a headache more so on the back of her head.  The headache gets worse when she is laying down and during her sleep.  It gets better with getting up. She feels like an intense pressure or a load of bricks has hit her in the head with this headache. Denies any preceding illnesses or sicknesses. She did have an induced abortion at approximately 11 weeks on 09/15/2018 which was uncomplicated.  She was on antibiotics for after the procedure until about last Friday. She was at some point on Humira for ankylosing spondylitis but has been off of it for more than a year. She notes noncompliance to her hypertensive medications and has been seen sometimes by her providers for hypertensive urgency. She went to her primary care physician but it was noted that she had a fever and she was hypertensive. She was sent to emergency room for further evaluation. She was noted to have a fever of 103 Fahrenheit. She was given a migraine cocktail in the ED which relieved her headache some but then the headache returned after a few hours. Neurology consultation was obtained for this headache as well as fever. Brain imaging was obtained in the form of an MRI which was unremarkable.   ROS: Review of systems was performed and is negative except noted in the HPI. Patient denies any sick contacts.  She has 3 children who  have not been sick.  Her husband has not been sick.  Past Medical History:  Diagnosis Date  . Arthritis   . HSV (herpes simplex virus) infection   . Hypertension   . Infection    Valtrex for HSV 2009   . Urinary tract infection     Family History  Problem Relation Age of Onset  . Hypertension Mother   . Hypertension Maternal Aunt   . Hypertension Maternal Uncle   . Cancer Maternal Grandmother   . Hypertension Maternal Grandmother   . Hypertension Maternal Grandfather   . Hypertension Paternal Grandmother   . Stroke Paternal Grandmother   . Hypertension Paternal Grandfather   . Stroke Paternal Grandfather   . Hypertension Father    Social History:   reports that she quit smoking about 6 years ago. She quit after 2.00 years of use. She has never used smokeless tobacco. She reports current alcohol use. She reports that she does not use drugs.  Medications  Current Facility-Administered Medications:  .  [COMPLETED] sodium chloride 0.9 % bolus 1,000 mL, 1,000 mL, Intravenous, Once, Stopped at 10/06/18 1932 **FOLLOWED BY** 0.9 %  sodium chloride infusion, 1,000 mL, Intravenous, Continuous, Pfeiffer, Marcy, MD, Last Rate: 125 mL/hr at 10/06/18 2009, 1,000 mL at 10/06/18 2009 .  acyclovir (ZOVIRAX) 780 mg in dextrose 5 % 150 mL IVPB, 780 mg, Intravenous, Once, Pfeiffer, Marcy, MD .  cefTRIAXone (ROCEPHIN) 2 g in sodium chloride 0.9 % 100 mL IVPB, 2 g, Intravenous, BID, Pfeiffer, Marcy, MD .  diphenhydrAMINE (BENADRYL) injection 25 mg,  25 mg, Intravenous, Once, Pfeiffer, Marcy, MD .  ketorolac (TORADOL) 30 MG/ML injection 30 mg, 30 mg, Intravenous, Once, Pfeiffer, Marcy, MD .  lactated ringers bolus 1,000 mL, 1,000 mL, Intravenous, Once, Pfeiffer, Marcy, MD .  lidocaine (PF) (XYLOCAINE) 1 % injection, , , ,  .  metoCLOPramide (REGLAN) injection 10 mg, 10 mg, Intravenous, Once, Pfeiffer, Marcy, MD  Current Outpatient Medications:  .  amLODipine (NORVASC) 5 MG tablet, Take 1 tablet (5  mg total) by mouth daily., Disp: 30 tablet, Rfl: 3 .  aspirin-acetaminophen-caffeine (EXCEDRIN MIGRAINE) 250-250-65 MG tablet, Take 1 tablet by mouth every 6 (six) hours as needed for headache or migraine., Disp: , Rfl:  .  potassium chloride SA (K-DUR,KLOR-CON) 20 MEQ tablet, Take 1 tablet (20 mEq total) by mouth daily. (Patient not taking: Reported on 10/06/2018), Disp: 10 tablet, Rfl: 0 .  sulfamethoxazole-trimethoprim (BACTRIM DS,SEPTRA DS) 800-160 MG tablet, Take 1 tablet by mouth 2 (two) times daily for 5 days., Disp: 10 tablet, Rfl: 0   Exam: Current vital signs: BP (!) 154/96   Pulse (!) 125   Temp 100.2 F (37.9 C) (Oral)   Resp 20   Ht 5\' 3"  (1.6 m)   Wt 78.1 kg   LMP 07/02/2018 Comment: Pt states abortion performed on 11/29  SpO2 98%   BMI 30.50 kg/m  Vital signs in last 24 hours: Temp:  [100.2 F (37.9 C)-103 F (39.4 C)] 100.2 F (37.9 C) (12/18 2250) Pulse Rate:  [114-136] 125 (12/18 2250) Resp:  [16-20] 20 (12/18 2250) BP: (135-159)/(92-107) 154/96 (12/18 2250) SpO2:  [97 %-100 %] 98 % (12/18 2250) Weight:  [77.1 kg-78.1 kg] 78.1 kg (12/18 1612) GENERAL: Awake, alert in NAD HEENT: - Normocephalic and atraumatic, dry mm, no LN++, no Thyromegally.  Funduscopic difficult to perform as pupils were not dilated-disc not properly visualized. LUNGS - Clear to auscultation bilaterally with no wheezes CV - S1S2 RRR, no m/r/g, equal pulses bilaterally. ABDOMEN - Soft, nontender, nondistended with normoactive BS Ext: warm, well perfused, intact peripheral pulses, no edema  NEURO:  Mental Status: AA&Ox3  Language: speech is clear.  Naming, repetition, fluency, and comprehension intact. Cranial Nerves: PERR. EOMI, visual fields full, no facial asymmetry, facial sensation intact, hearing intact, tongue/uvula/soft palate midline, normal sternocleidomastoid and trapezius muscle strength. No evidence of tongue atrophy or fibrillations Motor: 5/5 all over no vertical drift Tone:  is normal and bulk is normal Sensation- Intact to light touch bilaterally Coordination: FTN intact bilaterally Gait- deferred No neck stiffness.  Kernig's and Brudzinski's negative.  Labs I have reviewed labs in epic and the results pertinent to this consultation are:  CBC    Component Value Date/Time   WBC 10.4 10/06/2018 1649   RBC 4.45 10/06/2018 1649   HGB 13.1 10/06/2018 1649   HCT 40.0 10/06/2018 1649   PLT 398 10/06/2018 1649   MCV 89.9 10/06/2018 1649   MCH 29.4 10/06/2018 1649   MCHC 32.8 10/06/2018 1649   RDW 13.8 10/06/2018 1649   LYMPHSABS 1.1 10/06/2018 1649   MONOABS 0.9 10/06/2018 1649   EOSABS 0.0 10/06/2018 1649   BASOSABS 0.0 10/06/2018 1649    CMP     Component Value Date/Time   NA 134 (L) 10/06/2018 1649   K 3.5 10/06/2018 1649   CL 99 10/06/2018 1649   CO2 26 10/06/2018 1649   GLUCOSE 100 (H) 10/06/2018 1649   BUN 9 10/06/2018 1649   CREATININE 0.93 10/06/2018 1649   CREATININE 0.82 08/20/2017 0915  CALCIUM 9.2 10/06/2018 1649   PROT 8.4 (H) 10/06/2018 1649   ALBUMIN 4.3 10/06/2018 1649   AST 18 10/06/2018 1649   ALT 13 10/06/2018 1649   ALKPHOS 64 10/06/2018 1649   BILITOT 0.9 10/06/2018 1649   GFRNONAA >60 10/06/2018 1649   GFRNONAA 95 08/20/2017 0915   GFRAA >60 10/06/2018 1649   GFRAA 110 08/20/2017 0915   Imaging I have reviewed the images obtained:  MRI examination of the brain with and without contrast shows no acute changes.  Normal MRI of the brain.  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 Describes headache as pressure along with discomfort in the right side of the neck. Also noted to have a fever of 103 Fahrenheit.  She said she does not feel febrile but fever was noted at the doctor's office as well as in the emergency  room. Some relief in the headache with a migraine cocktail but the headache returned after a few hours of the migraine cocktail. Given the fact that she has headache, no clear source of fever and has a fever, a CNS infection should be ruled out. Low suspicion for bacterial meningitis but could be viral meningoencephalitis. Other possibilities given the description of headache-dural venous sinus thrombosis as well as idiopathic intracranial hypertension but IIHT does not usually present with fevers.  Impression: Headache and fevers Evaluate for CNS infection Evaluate for dural venous sinus thrombosis Evaluate for idiopathic intracranial hypertension Evaluate for any evidence of DVT that might explain FUO.  Recommendations - CT venogram head, CTA head and neck - Spinal tap for with opening pressure and CSF analysis.  Sent for glucose protein cell count as well as HSV PCR along with Gram stain and culture. - Symptomatic management of headache and fever. -Evaluation for any other sources of infection including an abdominal or pelvic source given the recent pelvic procedure-defer to primary team -Start empiric meningitic and encephalitic coverage for bacterial as well as HSV encephalitis with acyclovir.  She is less than 10 years old, do not see the need for ampicillin for Listeria coverage.  Neurology service will follow with you.   -- Amie Portland, MD [facility] Pager: [phone] If 7pm to 7am, please call on call as listed on AMION.

## 2018-10-08 DIAGNOSIS — I10 Essential (primary) hypertension: Secondary | ICD-10-CM | POA: Diagnosis not present

## 2018-10-08 DIAGNOSIS — R51 Headache: Secondary | ICD-10-CM | POA: Diagnosis not present

## 2018-10-08 DIAGNOSIS — R509 Fever, unspecified: Secondary | ICD-10-CM | POA: Diagnosis not present

## 2018-10-08 LAB — CBC
HCT: 30.7 % — ABNORMAL LOW (ref 36.0–46.0)
Hemoglobin: 10 g/dL — ABNORMAL LOW (ref 12.0–15.0)
MCH: 29.7 pg (ref 26.0–34.0)
MCHC: 32.6 g/dL (ref 30.0–36.0)
MCV: 91.1 fL (ref 80.0–100.0)
Platelets: 348 10*3/uL (ref 150–400)
RBC: 3.37 MIL/uL — AB (ref 3.87–5.11)
RDW: 14.3 % (ref 11.5–15.5)
WBC: 21.3 10*3/uL — ABNORMAL HIGH (ref 4.0–10.5)
nRBC: 0 % (ref 0.0–0.2)

## 2018-10-08 LAB — COMPREHENSIVE METABOLIC PANEL
ALT: 14 U/L (ref 0–44)
AST: 19 U/L (ref 15–41)
Albumin: 3.2 g/dL — ABNORMAL LOW (ref 3.5–5.0)
Alkaline Phosphatase: 57 U/L (ref 38–126)
Anion gap: 6 (ref 5–15)
BUN: 9 mg/dL (ref 6–20)
CO2: 24 mmol/L (ref 22–32)
Calcium: 8.1 mg/dL — ABNORMAL LOW (ref 8.9–10.3)
Chloride: 109 mmol/L (ref 98–111)
Creatinine, Ser: 0.54 mg/dL (ref 0.44–1.00)
GFR calc Af Amer: >60 mL/min (ref >60)
GFR calc non Af Amer: >60 mL/min (ref >60)
Glucose, Bld: 110 mg/dL — ABNORMAL HIGH (ref 70–99)
Potassium: 3.8 mmol/L (ref 3.5–5.1)
Sodium: 139 mmol/L (ref 135–145)
Total Bilirubin: 0.6 mg/dL (ref 0.3–1.2)
Total Protein: 6.3 g/dL — ABNORMAL LOW (ref 6.5–8.1)

## 2018-10-08 LAB — HSV DNA BY PCR (REFERENCE LAB)
HSV 1 DNA: NEGATIVE
HSV 2 DNA: NEGATIVE

## 2018-10-08 NOTE — Progress Notes (Signed)
Reason for consult: Headache  Subjective: Patient states she is feels much better and has no longer having a headache.   ROS: negative except above  Examination  Vital signs in last 24 hours: Temp:  [97.9 F (36.6 C)-98.4 F (36.9 C)] 98.4 F (36.9 C) (12/20 1409) Pulse Rate:  [62-97] 97 (12/20 1409) Resp:  [18-20] 18 (12/20 1409) BP: (121-137)/(87-95) 128/88 (12/20 1409) SpO2:  [98 %-100 %] 100 % (12/20 1409)  General: lying in bed CVS: pulse-normal rate and rhythm RS: breathing comfortably Extremities: normal   Neuro: MS: Alert, oriented, follows commands CN: pupils equal and reactive,  EOMI, face symmetric, tongue midline, normal sensation over face, Motor: 5/5 strength in all 4 extremities Reflexes: 2+ bilaterally over patella, biceps, plantars: flexor Coordination: normal Gait: not tested  Basic Metabolic Panel: Recent Labs  Lab 10/06/18 1649 10/07/18 0500 10/08/18 0356  NA 134* 136 139  K 3.5 3.4* 3.8  CL 99 102 109  CO2 26 23 24   GLUCOSE 100* 143* 110*  BUN 9 7 9   CREATININE 0.93 0.93 0.54  CALCIUM 9.2 8.2* 8.1*    CBC: Recent Labs  Lab 10/06/18 1649 10/07/18 0500 10/08/18 0356  WBC 10.4 17.3* 21.3*  NEUTROABS 8.3*  --   --   HGB 13.1 11.7* 10.0*  HCT 40.0 35.4* 30.7*  MCV 89.9 90.5 91.1  PLT 398 360 348     Coagulation Studies: No results for input(s): LABPROT, INR in the last 72 hours.  Imaging Reviewed:  MRI brain, CTA and CT venogram   ASSESSMENT AND PLAN  32 year old female who presents with 5-day history of headache underwent extensive work-up including MRI brain with and without contrast, CT venogram and CT angiogram of the head and neck to rule out carotid dissection.  She underwent a spinal tap with cell count 3. Protein of 23. HSV negative.  She was found to have urinary tract infection which was treated and is no longer febrile.  Her headache is also improved.  Headache likely in the setting of fever, resolved   No further  work-up needed.  Neurology will sign off   [place] Pager Number [phone] For questions after 7pm please refer to AMION to reach the Neurologist on call

## 2018-10-08 NOTE — Plan of Care (Signed)
Pt 's pain level was reported as a 0/10 this shift.

## 2018-10-08 NOTE — Progress Notes (Signed)
PROGRESS NOTE    Kirsten Long  YQM:[id number] DOB: 1986/10/01 DOA: 10/06/2018 PCP: Billie Ruddy, MD    Brief Narrative:  32 y.o. female with medical history significant of HTN, HSV, and HLA-B27 positive ankylosing spondylitis with iritis; who presents with complaints of a progressively worsening headache for the last 5 days.  Symptoms initially started with complains of right sided neck swelling and stiffness and then progressed into a headache.  Pain is severe and most notably in the back and front of her head.  She has had some sensitivity to light.  Denies having any recent sick contacts, vomiting, diarrhea, abdominal pain, joint pain, shortness of breath, cough, recent travel, tick exposure, changes in vision, or focal weakness.  Recently she had an induced abortion on 09/15/2018 that was reported to be uncomplicated.  Patient admits to not taking her antihypertensive medications amlodipine.  Patient had gone and seen her primary care provider earlier in the day and due to her symptoms have been advised to come to the emergency department for further evaluation.  She does not routinely get headaches and reports symptoms do not feel like previous flare of ankylosing spondylitis.   ED Course: On admission into the emergency department patient found to be febrile up to 103 F, heart rate 114-136, blood pressure up to 159/107, and other vital signs maintained.  Labs revealed WBC 10.4, sodium 134, lactic acid 0.66, and all other labs relatively within normal limits.  Influenza screen was negative.  Urinalysis was positive for moderate leukocytes, moderate hemoglobin, rare bacteria, and 11-20 WBCs.  MRI of the brain showed no acute abnormalities.  Lumbar puncture was obtained.  Neurology Dr. Rory Percy had been consulted due to patient's symptoms and recommended CT angiogram of the head and neck with and without contrast with CT venogram.  Assessment & Plan:   Principal Problem:  Sepsis ([place]) Active Problems:   Essential hypertension   H/O ankylosing spondylitis   Headache   Neck pain   Urinary tract infection  Sepsis secondary to urinary tract infection  - Patient presented with fevers and UA suggestive of UTI - also had headaches with concern for possible meningitis. Pt now s/p LP, meningitis ruled out per Neurology. Hold further abx - Continue on rocephin for UTI - Urine culture not obtained. Will order now - WBC up to 21 this AM. Currently afebrile. Patient reports feeling better.Given worsening WBC, will continue IV rocephin - Repeat CBC in AM  Headache and right-sided neck pain, likely migraine:  - Patient reports having severe headache.   - continue Toradol and Benadryl provided temporary relief.  - Fioricet as needed for headache -Headache improved  Essential hypertension: Uncontrolled.   -Patient's blood pressures elevated up to 159/107 on admission.  Patient reports noncompliance with amlodipine. - Continued amlodipine - Blood pressure now much controlled. Cont med   History of ankylosing spondylitis:  - Patient followed by rheumatology for symptoms.  She reports right-sided neck swelling, but does not report similar symptoms with previous flares in the past. - Stable at present  History of HSV -currently stable  DVT prophylaxis: Lovenox subQ Code Status: Full Family Communication: Pt in room, family not at bedside Disposition Plan: Uncertain at this time  Consultants:   Neurology  Procedures:   LP 12/18  Antimicrobials: Anti-infectives (From admission, onward)   Start     Dose/Rate Route Frequency Ordered Stop   10/07/18 2200  cefTRIAXone (ROCEPHIN) 2 g in sodium chloride 0.9 % 100 mL IVPB  2 g 200 mL/hr over 30 Minutes Intravenous Every 24 hours 10/07/18 0601     10/07/18 0145  vancomycin (VANCOCIN) 1,500 mg in sodium chloride 0.9 % 500 mL IVPB     1,500 mg 250 mL/hr over 120 Minutes Intravenous  Once 10/07/18 0131  10/07/18 0614   10/07/18 0015  cefTRIAXone (ROCEPHIN) 2 g in sodium chloride 0.9 % 100 mL IVPB  Status:  Discontinued     2 g 200 mL/hr over 30 Minutes Intravenous 2 times daily 10/07/18 0006 10/07/18 0601   10/07/18 0015  acyclovir (ZOVIRAX) 780 mg in dextrose 5 % 150 mL IVPB     780 mg 165.6 mL/hr over 60 Minutes Intravenous  Once 10/07/18 0010 10/07/18 0339   10/07/18 0000  cefTRIAXone (ROCEPHIN) 1 g in sodium chloride 0.9 % 100 mL IVPB  Status:  Discontinued     1 g 200 mL/hr over 30 Minutes Intravenous  Once October 28, 2018 2358 10/07/18 0005   2018/10/28 2345  amoxicillin-clavulanate (AUGMENTIN) 875-125 MG per tablet 1 tablet  Status:  Discontinued     1 tablet Oral  Once 10-28-2018 2343 10/28/18 2348   10-28-2018 2345  metroNIDAZOLE (FLAGYL) tablet 500 mg  Status:  Discontinued     500 mg Oral  Once 2018/10/28 2343 2018/10/28 2348      Subjective: Reports feeling better  Objective: Vitals:   10/07/18 1626 10/07/18 2224 10/08/18 0450 10/08/18 0954  BP: 128/86 121/89 121/87 (!) 137/95  Pulse: 93 83 81 62  Resp: _0 Temp: 98.2 F (36.8 C) 97.9 F (36.6 C) 98.1 F (36.7 C)   TempSrc: Oral Oral Oral   SpO2: 100% 100% 98%   Weight:      Height:        Intake/Output Summary (Last 24 hours) at 10/08/2018 1002 Last data filed at 10/08/2018 0600 Gross per 24 hour  Intake 2482.58 ml  Output -  Net 2482.58 ml   Filed Weights   10/28/18 1612  Weight: 78.1 kg    Examination: General exam: Awake, laying in bed, in nad Respiratory system: Normal respiratory effort, no wheezing Cardiovascular system: regular rate, s1, s2 Gastrointestinal system: Soft, nondistended, positive BS Central nervous system: CN2-12 grossly intact, strength intact Extremities: Perfused, no clubbing Skin: Normal skin turgor, no notable skin lesions seen Psychiatry: Mood normal // no visual hallucinations    Data Reviewed: I have personally reviewed following labs and imaging studies  CBC: Recent Labs    Lab 10-28-2018 1649 10/07/18 0500 10/08/18 0356  WBC 10.4 17.3* 21.3*  NEUTROABS 8.3*  --   --   HGB 13.1 11.7* 10.0*  HCT 40.0 35.4* 30.7*  MCV 89.9 90.5 91.1  PLT 398 360 767   Basic Metabolic Panel: Recent Labs  Lab 2018/10/28 1649 10/07/18 0500 10/08/18 0356  NA 134* 136 139  K 3.5 3.4* 3.8  CL 99 102 109  CO2 _1 GLUCOSE 100* 143* 110*  BUN _2 CREATININE 0.93 0.93 0.54  CALCIUM 9.2 8.2* 8.1*   GFR: Estimated Creatinine Clearance: 99 mL/min (by C-G formula based on SCr of 0.54 mg/dL). Liver Function Tests: Recent Labs  Lab 2018/10/28 1649 10/08/18 0356  AST 18 19  ALT 13 14  ALKPHOS 64 57  BILITOT 0.9 0.6  PROT 8.4* 6.3*  ALBUMIN 4.3 3.2*   No results for input(s): LIPASE, AMYLASE in the last 168 hours. No results for input(s): AMMONIA in the last 168 hours. Coagulation Profile: No results  for input(s): INR, PROTIME in the last 168 hours. Cardiac Enzymes: No results for input(s): CKTOTAL, CKMB, CKMBINDEX, TROPONINI in the last 168 hours. BNP (last 3 results) No results for input(s): PROBNP in the last 8760 hours. HbA1C: No results for input(s): HGBA1C in the last 72 hours. CBG: No results for input(s): GLUCAP in the last 168 hours. Lipid Profile: No results for input(s): CHOL, HDL, LDLCALC, TRIG, CHOLHDL, LDLDIRECT in the last 72 hours. Thyroid Function Tests: Recent Labs    10/06/18 1732  TSH 0.514   Anemia Panel: No results for input(s): VITAMINB12, FOLATE, FERRITIN, TIBC, IRON, RETICCTPCT in the last 72 hours. Sepsis Labs: Recent Labs  Lab 10/06/18 1656 10/06/18 1855  LATICACIDVEN 0.66 0.74    Recent Results (from the past 240 hour(s))  Urine Culture     Status: None   Collection Time: 10/06/18  4:45 PM  Result Value Ref Range Status   MICRO NUMBER: [id number]  Final   SPECIMEN QUALITY: Adequate  Final   Sample Source URINE  Final   STATUS: FINAL  Final   ISOLATE 1:   Final    Three or more organisms present, each greater than  10,000 CFU/mL. May represent normal flora contamination from external genitalia. No further testing is required.  Group A Strep by PCR     Status: None   Collection Time: 10/06/18  6:27 PM  Result Value Ref Range Status   Group A Strep by PCR NOT DETECTED NOT DETECTED Final    Comment: Performed at [facility], [place] [address], [place], [place] [address]  Respiratory Panel by PCR     Status: None   Collection Time: 10/06/18  6:27 PM  Result Value Ref Range Status   Adenovirus NOT DETECTED NOT DETECTED Final   Coronavirus 229E NOT DETECTED NOT DETECTED Final   Coronavirus HKU1 NOT DETECTED NOT DETECTED Final   Coronavirus NL63 NOT DETECTED NOT DETECTED Final   Coronavirus OC43 NOT DETECTED NOT DETECTED Final   Metapneumovirus NOT DETECTED NOT DETECTED Final   Rhinovirus / Enterovirus NOT DETECTED NOT DETECTED Final   Influenza A NOT DETECTED NOT DETECTED Final   Influenza B NOT DETECTED NOT DETECTED Final   Parainfluenza Virus 1 NOT DETECTED NOT DETECTED Final   Parainfluenza Virus 2 NOT DETECTED NOT DETECTED Final   Parainfluenza Virus 3 NOT DETECTED NOT DETECTED Final   Parainfluenza Virus 4 NOT DETECTED NOT DETECTED Final   Respiratory Syncytial Virus NOT DETECTED NOT DETECTED Final   Bordetella pertussis NOT DETECTED NOT DETECTED Final   Chlamydophila pneumoniae NOT DETECTED NOT DETECTED Final   Mycoplasma pneumoniae NOT DETECTED NOT DETECTED Final    Comment: Performed at [facility], [place] [address], [place], [place] [address]  Culture, blood (routine x 2)     Status: None (Preliminary result)   Collection Time: 10/06/18  6:28 PM  Result Value Ref Range Status   Specimen Description   Final    BLOOD RIGHT ANTECUBITAL Performed at [facility] [address] [place], [place] [address]    Special Requests   Final    BOTTLES DRAWN AEROBIC AND ANAEROBIC Blood Culture adequate volume Performed at [facility]  [address], [place], [place] [address]    Culture   Final    NO GROWTH < 12 HOURS Performed at [facility] [address] [place], [place] [address]    Report Status PENDING  Incomplete  Culture, blood (routine x  2)     Status: None (Preliminary result)   Collection Time: 10/06/18  6:28 PM  Result Value Ref Range Status   Specimen Description   Final    BLOOD LEFT ANTECUBITAL Performed at [facility] [address] [place], [place] [address]    Special Requests   Final    BOTTLES DRAWN AEROBIC AND ANAEROBIC Blood Culture adequate volume Performed at [facility] [address] [place], [place] [address]    Culture   Final    NO GROWTH < 12 HOURS Performed at [facility] [address] [place], [place] [address]    Report Status PENDING  Incomplete  CSF culture     Status: None (Preliminary result)   Collection Time: 10/07/18  1:43 AM  Result Value Ref Range Status   Specimen Description CSF  Final   Special Requests NONE  Final   Gram Stain   Final    NO WBC SEEN NO ORGANISMS SEEN CYTOSPIN SMEAR RESULT CALLED TO, READ BACK BY AND VERIFIED WITH: [facility] RN AT 6384 10/07/18 BY TIBBITTS,K Performed at [facility], [place] [address], [place], [place] [address]    Culture PENDING  Incomplete   Report Status PENDING  Incomplete     Radiology Studies: Ct Angio Head W Or Wo Contrast  Result Date: 10/07/2018 CLINICAL DATA:  Fever and headache. EXAM: CT ANGIOGRAPHY HEAD AND NECK TECHNIQUE: Multidetector CT imaging of the head and neck was performed using the standard protocol during bolus administration of intravenous contrast. Multiplanar CT image reconstructions and MIPs were obtained to evaluate the vascular anatomy. Carotid stenosis measurements (when applicable) are obtained utilizing NASCET criteria, using the distal internal carotid diameter as the denominator. CONTRAST:  141m ISOVUE-370 IOPAMIDOL  (ISOVUE-370) INJECTION 76% COMPARISON:  None. FINDINGS: CT HEAD FINDINGS Brain: There is no mass, hemorrhage or extra-axial collection. The size and configuration of the ventricles and extra-axial CSF spaces are normal. There is no acute or chronic infarction. The brain parenchyma is normal. Skull: The visualized skull base, calvarium and extracranial soft tissues are normal. Sinuses/Orbits: No fluid levels or advanced mucosal thickening of the visualized paranasal sinuses. No mastoid or middle ear effusion. The orbits are normal. CTA NECK FINDINGS SKELETON: There is no bony spinal canal stenosis. No lytic or blastic lesion. OTHER NECK: Normal pharynx, larynx and major salivary glands. No cervical lymphadenopathy. Unremarkable thyroid gland. UPPER CHEST: No pneumothorax or pleural effusion. No nodules or masses. AORTIC ARCH: There is no calcific atherosclerosis of the aortic arch. There is no aneurysm, dissection or hemodynamically significant stenosis of the visualized ascending aorta and aortic arch. Conventional 3 vessel aortic branching pattern. The visualized proximal subclavian arteries are widely patent. RIGHT CAROTID SYSTEM: --Common carotid artery: Widely patent origin without common carotid artery dissection or aneurysm. --Internal carotid artery: Normal without aneurysm, dissection or stenosis. --External carotid artery: No acute abnormality. LEFT CAROTID SYSTEM: --Common carotid artery: Widely patent origin without common carotid artery dissection or aneurysm. --Internal carotid artery: Normal without aneurysm, dissection or stenosis. --External carotid artery: No acute abnormality. VERTEBRAL ARTERIES: Left dominant configuration. Both origins are normal. No dissection, occlusion or flow-limiting stenosis to the vertebrobasilar confluence. CTA HEAD FINDINGS ANTERIOR CIRCULATION: --Intracranial internal carotid arteries: Normal. --Anterior cerebral arteries: Normal. Both A1 segments are present. Patent  anterior communicating artery. --Middle cerebral arteries: Normal. POSTERIOR CIRCULATION: --Basilar artery: Normal. --Posterior cerebral arteries: Normal. --Superior cerebellar arteries: Normal. --Inferior cerebellar arteries: Normal anterior and posterior inferior cerebellar  arteries. ANATOMIC VARIANTS: None DELAYED PHASE: No parenchymal contrast enhancement. Review of the MIP images confirms the above findings. IMPRESSION: Normal CTA of the head and neck. Electronically Signed   By: Ulyses Jarred M.D.   On: 10/07/2018 02:38   Dg Chest 2 View  Result Date: 10/06/2018 CLINICAL DATA:  Fever of unknown origin. EXAM: CHEST - 2 VIEW COMPARISON:  10/04/2017 FINDINGS: The heart size and mediastinal contours are within normal limits. Both lungs are clear. The visualized skeletal structures are unremarkable. IMPRESSION: Negative.  No active cardiopulmonary disease. Electronically Signed   By: Earle Gell M.D.   On: 10/06/2018 18:27   Ct Angio Neck W Or Wo Contrast  Result Date: 10/07/2018 CLINICAL DATA:  Fever and headache. EXAM: CT ANGIOGRAPHY HEAD AND NECK TECHNIQUE: Multidetector CT imaging of the head and neck was performed using the standard protocol during bolus administration of intravenous contrast. Multiplanar CT image reconstructions and MIPs were obtained to evaluate the vascular anatomy. Carotid stenosis measurements (when applicable) are obtained utilizing NASCET criteria, using the distal internal carotid diameter as the denominator. CONTRAST:  167m ISOVUE-370 IOPAMIDOL (ISOVUE-370) INJECTION 76% COMPARISON:  None. FINDINGS: CT HEAD FINDINGS Brain: There is no mass, hemorrhage or extra-axial collection. The size and configuration of the ventricles and extra-axial CSF spaces are normal. There is no acute or chronic infarction. The brain parenchyma is normal. Skull: The visualized skull base, calvarium and extracranial soft tissues are normal. Sinuses/Orbits: No fluid levels or advanced mucosal  thickening of the visualized paranasal sinuses. No mastoid or middle ear effusion. The orbits are normal. CTA NECK FINDINGS SKELETON: There is no bony spinal canal stenosis. No lytic or blastic lesion. OTHER NECK: Normal pharynx, larynx and major salivary glands. No cervical lymphadenopathy. Unremarkable thyroid gland. UPPER CHEST: No pneumothorax or pleural effusion. No nodules or masses. AORTIC ARCH: There is no calcific atherosclerosis of the aortic arch. There is no aneurysm, dissection or hemodynamically significant stenosis of the visualized ascending aorta and aortic arch. Conventional 3 vessel aortic branching pattern. The visualized proximal subclavian arteries are widely patent. RIGHT CAROTID SYSTEM: --Common carotid artery: Widely patent origin without common carotid artery dissection or aneurysm. --Internal carotid artery: Normal without aneurysm, dissection or stenosis. --External carotid artery: No acute abnormality. LEFT CAROTID SYSTEM: --Common carotid artery: Widely patent origin without common carotid artery dissection or aneurysm. --Internal carotid artery: Normal without aneurysm, dissection or stenosis. --External carotid artery: No acute abnormality. VERTEBRAL ARTERIES: Left dominant configuration. Both origins are normal. No dissection, occlusion or flow-limiting stenosis to the vertebrobasilar confluence. CTA HEAD FINDINGS ANTERIOR CIRCULATION: --Intracranial internal carotid arteries: Normal. --Anterior cerebral arteries: Normal. Both A1 segments are present. Patent anterior communicating artery. --Middle cerebral arteries: Normal. POSTERIOR CIRCULATION: --Basilar artery: Normal. --Posterior cerebral arteries: Normal. --Superior cerebellar arteries: Normal. --Inferior cerebellar arteries: Normal anterior and posterior inferior cerebellar arteries. ANATOMIC VARIANTS: None DELAYED PHASE: No parenchymal contrast enhancement. Review of the MIP images confirms the above findings. IMPRESSION:  Normal CTA of the head and neck. Electronically Signed   By: KUlyses JarredM.D.   On: 10/07/2018 02:38   Mr BJeri CosAnd Wo Contrast  Result Date: 10/06/2018 CLINICAL DATA:  Thunderclap headache EXAM: MRI HEAD WITHOUT AND WITH CONTRAST TECHNIQUE: Multiplanar, multiecho pulse sequences of the brain and surrounding structures were obtained without and with intravenous contrast. CONTRAST:  7 mL Gadavist COMPARISON:  None. FINDINGS: BRAIN: There is no acute infarct, acute hemorrhage, hydrocephalus or extra-axial collection. The midline structures are normal. No midline shift or other  mass effect. There are no old infarcts. The white matter signal is normal for the patient's age. The cerebral and cerebellar volume are age-appropriate. Susceptibility-sensitive sequences show no chronic microhemorrhage or superficial siderosis. No abnormal contrast enhancement. VASCULAR: Major intracranial arterial and venous sinus flow voids are normal. SKULL AND UPPER CERVICAL SPINE: Calvarial bone marrow signal is normal. There is no skull base mass. Visualized upper cervical spine and soft tissues are normal. SINUSES/ORBITS: No fluid levels or advanced mucosal thickening. No mastoid or middle ear effusion. The orbits are normal. IMPRESSION: Normal brain MRI. Electronically Signed   By: Ulyses Jarred M.D.   On: 10/06/2018 21:59   Ct Venogram Head  Result Date: 10/07/2018 CLINICAL DATA:  Headache and fever EXAM: CT VENOGRAM HEAD TECHNIQUE: Following contrast administration, vena graphic images of the head were obtained. Multiplanar reconstructions were provided. CONTRAST:  177m ISOVUE-370 IOPAMIDOL (ISOVUE-370) INJECTION 76% COMPARISON:  None. FINDINGS: Superior sagittal sinus: Normal. Straight sinus: Normal. Inferior sagittal sinus, vein of Galen and internal cerebral veins: Normal. Transverse sinuses: Normal. Sigmoid sinuses: Normal. Visualized jugular veins: Normal. IMPRESSION: No venous thrombosis. Electronically Signed    By: KUlyses JarredM.D.   On: 10/07/2018 03:13    Scheduled Meds: . amLODipine  5 mg Oral Daily  . enoxaparin (LOVENOX) injection  40 mg Subcutaneous Q24H  . sodium chloride flush  3 mL Intravenous Q12H   Continuous Infusions: . sodium chloride 1,000 mL (10/07/18 1737)  . cefTRIAXone (ROCEPHIN)  IV 2 g (10/08/18 0113)     LOS: 0 days   SMarylu Lund MD [facility] Pager On Amion  If 7PM-7AM, please contact night-coverage 10/08/2018, 10:02 AM

## 2018-10-09 DIAGNOSIS — Z8739 Personal history of other diseases of the musculoskeletal system and connective tissue: Secondary | ICD-10-CM | POA: Diagnosis not present

## 2018-10-09 DIAGNOSIS — R509 Fever, unspecified: Secondary | ICD-10-CM | POA: Diagnosis not present

## 2018-10-09 DIAGNOSIS — I10 Essential (primary) hypertension: Secondary | ICD-10-CM | POA: Diagnosis not present

## 2018-10-09 LAB — CBC
HEMATOCRIT: 34.1 % — AB (ref 36.0–46.0)
Hemoglobin: 10.9 g/dL — ABNORMAL LOW (ref 12.0–15.0)
MCH: 29.1 pg (ref 26.0–34.0)
MCHC: 32 g/dL (ref 30.0–36.0)
MCV: 90.9 fL (ref 80.0–100.0)
Platelets: 479 10*3/uL — ABNORMAL HIGH (ref 150–400)
RBC: 3.75 MIL/uL — ABNORMAL LOW (ref 3.87–5.11)
RDW: 14.6 % (ref 11.5–15.5)
WBC: 14.9 10*3/uL — ABNORMAL HIGH (ref 4.0–10.5)
nRBC: 0 % (ref 0.0–0.2)

## 2018-10-09 LAB — URINE CULTURE: Culture: NO GROWTH

## 2018-10-09 MED ORDER — CEFDINIR 300 MG PO CAPS: 300.0000 mg | ORAL_CAPSULE | Freq: Two times a day (BID) | ORAL | 0 refills | Status: AC

## 2018-10-09 MED ORDER — KETOROLAC TROMETHAMINE 10 MG PO TABS: 10.0000 mg | ORAL_TABLET | Freq: Four times a day (QID) | ORAL | 0 refills | Status: DC | PRN

## 2018-10-09 NOTE — Discharge Summary (Signed)
Physician Discharge Summary  Kirsten Long VIF:[id number] DOB: 1986-01-22 DOA: 10/06/2018  PCP: Billie Ruddy, MD  Admit date: 10/06/2018 Discharge date: 10/09/2018  Admitted From: Home Disposition:  Home  Recommendations for Outpatient Follow-up:  1. Follow up with PCP in 1-2 weeks  Discharge Condition:Improved CODE STATUS:Full Diet recommendation: Regular   Brief/Interim Summary: 32 y.o.femalewith medical history significant ofHTN, HSV, andHLA-B27 positive ankylosing spondylitiswith iritis; who presents with complaints of a progressively worsening headache for the last 5 days. Symptoms initially started with complains of right sided neck swelling and stiffness and thenprogressed into a headache. Pain is severe and most notably in the back and front of her head. She has had some sensitivity to light. Denies having any recent sick contacts, vomiting, diarrhea, abdominal pain, joint pain, shortness of breath, cough, recent travel, tick exposure, changes in vision, or focal weakness. Recently she had an induced abortion on 09/15/2018 that was reported to be uncomplicated. Patient admits to not taking her antihypertensive medications amlodipine. Patient had gone and seen her primary care provider earlier in the day and due to her symptoms have been advised to come to the emergency department for further evaluation. She does not routinely get headaches and reports symptoms do not feel like previous flare ofankylosing spondylitis.  ED Course:On admission into the emergency department patient found to be febrile up to 103 F, heart rate114-136,blood pressure up to 159/107, and other vital signs maintained. Labs revealed WBC 10.4, sodium 134, lactic acid 0.66, and all other labs relatively within normal limits. Influenza screen was negative. Urinalysis was positive for moderate leukocytes, moderate hemoglobin,rare bacteria, and11-20 WBCs.MRI of thebrain  showed no acute abnormalities. Lumbar puncture was obtained. Neurology Dr. Carolan Shiver been consulted due to patient's symptoms and recommended CT angiogram of the head and neck with and without contrast with CT venogram.  Discharge Diagnoses:  Principal Problem:   Sepsis ([place]) Active Problems:   Essential hypertension   H/O ankylosing spondylitis   Headache   Neck pain   Urinary tract infection  Sepsissecondaryto urinary tract infection  - Patient presented with fevers and UA suggestive of UTI - also had headaches with concern for possible meningitis. Pt now s/p LP, meningitis ruled out per Neurology. Hold further abx - Continued on rocephin for UTI - Urine culture not obtained initially. Urine culture was later obtained after initiating abx. No growth thus far - Clinically improved. WBC peaked to 21k, improved by time of d/c - will complete course of omnicef on discharge  Headache and right-sided neck pain, likely migraine:  - Patient reports having severe headache.  - continueToradol and Benadryl provided temporary relief.  -Fioricet as needed for headache -Headache improved  Essential hypertension: Uncontrolled.  -Patient's blood pressures elevated up to 159/107 on admission. Patient reports noncompliance with amlodipine. -Continued amlodipine -Blood pressure now much controlled. Cont med  History of ankylosing spondylitis:  - Patient followed by rheumatology for symptoms. She reports right-sided neck swelling, but does not report similar symptoms with previous flares in the past. - Stable at present  History of HSV -currently stable  Discharge Instructions   Allergies as of 10/09/2018   No Known Allergies     Medication List    STOP taking these medications   potassium chloride SA 20 MEQ tablet Commonly known as:  K-DUR,KLOR-CON   sulfamethoxazole-trimethoprim 800-160 MG tablet Commonly known as:  BACTRIM DS,SEPTRA DS     TAKE these  medications   amLODipine 5 MG tablet Commonly known as:  NORVASC Take  1 tablet (5 mg total) by mouth daily.   aspirin-acetaminophen-caffeine 250-250-65 MG tablet Commonly known as:  EXCEDRIN MIGRAINE Take 1 tablet by mouth every 6 (six) hours as needed for headache or migraine.   cefdinir 300 MG capsule Commonly known as:  OMNICEF Take 1 capsule (300 mg total) by mouth 2 (two) times daily for 3 days.   ketorolac 10 MG tablet Commonly known as:  TORADOL Take 1 tablet (10 mg total) by mouth every 6 (six) hours as needed.      Follow-up Information    Billie Ruddy, MD. Schedule an appointment as soon as possible for a visit in 2 week(s).   Specialty:  Family Medicine Contact information: [place] [address] [phone]          No Known Allergies   Procedures/Studies: Ct Angio Head W Or Wo Contrast  Result Date: 10/07/2018 CLINICAL DATA:  Fever and headache. EXAM: CT ANGIOGRAPHY HEAD AND NECK TECHNIQUE: Multidetector CT imaging of the head and neck was performed using the standard protocol during bolus administration of intravenous contrast. Multiplanar CT image reconstructions and MIPs were obtained to evaluate the vascular anatomy. Carotid stenosis measurements (when applicable) are obtained utilizing NASCET criteria, using the distal internal carotid diameter as the denominator. CONTRAST:  126m ISOVUE-370 IOPAMIDOL (ISOVUE-370) INJECTION 76% COMPARISON:  None. FINDINGS: CT HEAD FINDINGS Brain: There is no mass, hemorrhage or extra-axial collection. The size and configuration of the ventricles and extra-axial CSF spaces are normal. There is no acute or chronic infarction. The brain parenchyma is normal. Skull: The visualized skull base, calvarium and extracranial soft tissues are normal. Sinuses/Orbits: No fluid levels or advanced mucosal thickening of the visualized paranasal sinuses. No mastoid or middle ear effusion. The orbits are normal. CTA  NECK FINDINGS SKELETON: There is no bony spinal canal stenosis. No lytic or blastic lesion. OTHER NECK: Normal pharynx, larynx and major salivary glands. No cervical lymphadenopathy. Unremarkable thyroid gland. UPPER CHEST: No pneumothorax or pleural effusion. No nodules or masses. AORTIC ARCH: There is no calcific atherosclerosis of the aortic arch. There is no aneurysm, dissection or hemodynamically significant stenosis of the visualized ascending aorta and aortic arch. Conventional 3 vessel aortic branching pattern. The visualized proximal subclavian arteries are widely patent. RIGHT CAROTID SYSTEM: --Common carotid artery: Widely patent origin without common carotid artery dissection or aneurysm. --Internal carotid artery: Normal without aneurysm, dissection or stenosis. --External carotid artery: No acute abnormality. LEFT CAROTID SYSTEM: --Common carotid artery: Widely patent origin without common carotid artery dissection or aneurysm. --Internal carotid artery: Normal without aneurysm, dissection or stenosis. --External carotid artery: No acute abnormality. VERTEBRAL ARTERIES: Left dominant configuration. Both origins are normal. No dissection, occlusion or flow-limiting stenosis to the vertebrobasilar confluence. CTA HEAD FINDINGS ANTERIOR CIRCULATION: --Intracranial internal carotid arteries: Normal. --Anterior cerebral arteries: Normal. Both A1 segments are present. Patent anterior communicating artery. --Middle cerebral arteries: Normal. POSTERIOR CIRCULATION: --Basilar artery: Normal. --Posterior cerebral arteries: Normal. --Superior cerebellar arteries: Normal. --Inferior cerebellar arteries: Normal anterior and posterior inferior cerebellar arteries. ANATOMIC VARIANTS: None DELAYED PHASE: No parenchymal contrast enhancement. Review of the MIP images confirms the above findings. IMPRESSION: Normal CTA of the head and neck. Electronically Signed   By: KUlyses JarredM.D.   On: 10/07/2018 02:38   Dg  Chest 2 View  Result Date: 10/06/2018 CLINICAL DATA:  Fever of unknown origin. EXAM: CHEST - 2 VIEW COMPARISON:  10/04/2017 FINDINGS: The heart size and mediastinal contours are within normal limits. Both lungs are  clear. The visualized skeletal structures are unremarkable. IMPRESSION: Negative.  No active cardiopulmonary disease. Electronically Signed   By: Earle Gell M.D.   On: 10/06/2018 18:27   Ct Angio Neck W Or Wo Contrast  Result Date: 10/07/2018 CLINICAL DATA:  Fever and headache. EXAM: CT ANGIOGRAPHY HEAD AND NECK TECHNIQUE: Multidetector CT imaging of the head and neck was performed using the standard protocol during bolus administration of intravenous contrast. Multiplanar CT image reconstructions and MIPs were obtained to evaluate the vascular anatomy. Carotid stenosis measurements (when applicable) are obtained utilizing NASCET criteria, using the distal internal carotid diameter as the denominator. CONTRAST:  173m ISOVUE-370 IOPAMIDOL (ISOVUE-370) INJECTION 76% COMPARISON:  None. FINDINGS: CT HEAD FINDINGS Brain: There is no mass, hemorrhage or extra-axial collection. The size and configuration of the ventricles and extra-axial CSF spaces are normal. There is no acute or chronic infarction. The brain parenchyma is normal. Skull: The visualized skull base, calvarium and extracranial soft tissues are normal. Sinuses/Orbits: No fluid levels or advanced mucosal thickening of the visualized paranasal sinuses. No mastoid or middle ear effusion. The orbits are normal. CTA NECK FINDINGS SKELETON: There is no bony spinal canal stenosis. No lytic or blastic lesion. OTHER NECK: Normal pharynx, larynx and major salivary glands. No cervical lymphadenopathy. Unremarkable thyroid gland. UPPER CHEST: No pneumothorax or pleural effusion. No nodules or masses. AORTIC ARCH: There is no calcific atherosclerosis of the aortic arch. There is no aneurysm, dissection or hemodynamically significant stenosis of the  visualized ascending aorta and aortic arch. Conventional 3 vessel aortic branching pattern. The visualized proximal subclavian arteries are widely patent. RIGHT CAROTID SYSTEM: --Common carotid artery: Widely patent origin without common carotid artery dissection or aneurysm. --Internal carotid artery: Normal without aneurysm, dissection or stenosis. --External carotid artery: No acute abnormality. LEFT CAROTID SYSTEM: --Common carotid artery: Widely patent origin without common carotid artery dissection or aneurysm. --Internal carotid artery: Normal without aneurysm, dissection or stenosis. --External carotid artery: No acute abnormality. VERTEBRAL ARTERIES: Left dominant configuration. Both origins are normal. No dissection, occlusion or flow-limiting stenosis to the vertebrobasilar confluence. CTA HEAD FINDINGS ANTERIOR CIRCULATION: --Intracranial internal carotid arteries: Normal. --Anterior cerebral arteries: Normal. Both A1 segments are present. Patent anterior communicating artery. --Middle cerebral arteries: Normal. POSTERIOR CIRCULATION: --Basilar artery: Normal. --Posterior cerebral arteries: Normal. --Superior cerebellar arteries: Normal. --Inferior cerebellar arteries: Normal anterior and posterior inferior cerebellar arteries. ANATOMIC VARIANTS: None DELAYED PHASE: No parenchymal contrast enhancement. Review of the MIP images confirms the above findings. IMPRESSION: Normal CTA of the head and neck. Electronically Signed   By: KUlyses JarredM.D.   On: 10/07/2018 02:38   Mr BJeri CosAnd Wo Contrast  Result Date: 10/06/2018 CLINICAL DATA:  Thunderclap headache EXAM: MRI HEAD WITHOUT AND WITH CONTRAST TECHNIQUE: Multiplanar, multiecho pulse sequences of the brain and surrounding structures were obtained without and with intravenous contrast. CONTRAST:  7 mL Gadavist COMPARISON:  None. FINDINGS: BRAIN: There is no acute infarct, acute hemorrhage, hydrocephalus or extra-axial collection. The midline  structures are normal. No midline shift or other mass effect. There are no old infarcts. The white matter signal is normal for the patient's age. The cerebral and cerebellar volume are age-appropriate. Susceptibility-sensitive sequences show no chronic microhemorrhage or superficial siderosis. No abnormal contrast enhancement. VASCULAR: Major intracranial arterial and venous sinus flow voids are normal. SKULL AND UPPER CERVICAL SPINE: Calvarial bone marrow signal is normal. There is no skull base mass. Visualized upper cervical spine and soft tissues are normal. SINUSES/ORBITS: No fluid levels or advanced  mucosal thickening. No mastoid or middle ear effusion. The orbits are normal. IMPRESSION: Normal brain MRI. Electronically Signed   By: Ulyses Jarred M.D.   On: 10/06/2018 21:59   Ct Venogram Head  Result Date: 10/07/2018 CLINICAL DATA:  Headache and fever EXAM: CT VENOGRAM HEAD TECHNIQUE: Following contrast administration, vena graphic images of the head were obtained. Multiplanar reconstructions were provided. CONTRAST:  163m ISOVUE-370 IOPAMIDOL (ISOVUE-370) INJECTION 76% COMPARISON:  None. FINDINGS: Superior sagittal sinus: Normal. Straight sinus: Normal. Inferior sagittal sinus, vein of Galen and internal cerebral veins: Normal. Transverse sinuses: Normal. Sigmoid sinuses: Normal. Visualized jugular veins: Normal. IMPRESSION: No venous thrombosis. Electronically Signed   By: KUlyses JarredM.D.   On: 10/07/2018 03:13     Subjective: Eager to go home today  Discharge Exam: Vitals:   10/08/18 2104 10/09/18 0559  BP: 122/86 129/90  Pulse: 88 75  Resp: 16 16  Temp: 98.4 F (36.9 C) 97.6 F (36.4 C)  SpO2: 98% 99%   Vitals:   10/08/18 0954 10/08/18 1409 10/08/18 2104 10/09/18 0559  BP: (!) 137/95 128/88 122/86 129/90  Pulse: 62 97 88 75  Resp:  '18 16 16  ' Temp:  98.4 F (36.9 C) 98.4 F (36.9 C) 97.6 F (36.4 C)  TempSrc:  Oral Oral Oral  SpO2:  100% 98% 99%  Weight:      Height:         General: Pt is alert, awake, not in acute distress Cardiovascular: RRR, S1/S2 +, no rubs, no gallops Respiratory: CTA bilaterally, no wheezing, no rhonchi Abdominal: Soft, NT, ND, bowel sounds + Extremities: no edema, no cyanosis   The results of significant diagnostics from this hospitalization (including imaging, microbiology, ancillary and laboratory) are listed below for reference.     Microbiology: Recent Results (from the past 240 hour(s))  Urine Culture     Status: None   Collection Time: 10/06/18  4:45 PM  Result Value Ref Range Status   MICRO NUMBER: [id number] Final   SPECIMEN QUALITY: Adequate  Final   Sample Source URINE  Final   STATUS: FINAL  Final   ISOLATE 1:   Final    Three or more organisms present, each greater than 10,000 CFU/mL. May represent normal flora contamination from external genitalia. No further testing is required.  Group A Strep by PCR     Status: None   Collection Time: 10/06/18  6:27 PM  Result Value Ref Range Status   Group A Strep by PCR NOT DETECTED NOT DETECTED Final    Comment: Performed at [facility] [address] [place] [id number] Respiratory Panel by PCR     Status: None   Collection Time: 10/06/18  6:27 PM  Result Value Ref Range Status   Adenovirus NOT DETECTED NOT DETECTED Final   Coronavirus 229E NOT DETECTED NOT DETECTED Final   Coronavirus HKU1 NOT DETECTED NOT DETECTED Final   Coronavirus NL63 NOT DETECTED NOT DETECTED Final   Coronavirus OC43 NOT DETECTED NOT DETECTED Final   Metapneumovirus NOT DETECTED NOT DETECTED Final   Rhinovirus / Enterovirus NOT DETECTED NOT DETECTED Final   Influenza A NOT DETECTED NOT DETECTED Final   Influenza B NOT DETECTED NOT DETECTED Final   Parainfluenza Virus 1 NOT DETECTED NOT DETECTED Final   Parainfluenza Virus 2 NOT DETECTED NOT DETECTED Final   Parainfluenza Virus 3 NOT DETECTED NOT DETECTED Final   Parainfluenza Virus 4 NOT DETECTED NOT DETECTED  Final   Respiratory  Syncytial Virus NOT DETECTED NOT DETECTED Final   Bordetella pertussis NOT DETECTED NOT DETECTED Final   Chlamydophila pneumoniae NOT DETECTED NOT DETECTED Final   Mycoplasma pneumoniae NOT DETECTED NOT DETECTED Final    Comment: Performed at [facility], [place] [address], [place], [place] [address]  Culture, blood (routine x 2)     Status: None (Preliminary result)   Collection Time: 10/06/18  6:28 PM  Result Value Ref Range Status   Specimen Description   Final    BLOOD RIGHT ANTECUBITAL Performed at [place] [address] [place], [place] [address]    Special Requests   Final    BOTTLES DRAWN AEROBIC AND ANAEROBIC Blood Culture adequate volume Performed at [place] [address], [place], [place] [address]    Culture   Final    NO GROWTH 3 DAYS Performed at [facility], [place] [address], [place], [place] [address]    Report Status PENDING  Incomplete  Culture, blood (routine x 2)     Status: None (Preliminary result)   Collection Time: 10/06/18  6:28 PM  Result Value Ref Range Status   Specimen Description   Final    BLOOD LEFT ANTECUBITAL Performed at [place] [address], [place], [place] [address]    Special Requests   Final    BOTTLES DRAWN AEROBIC AND ANAEROBIC Blood Culture adequate volume Performed at [place] [address], [place], [place] [address]    Culture   Final    NO GROWTH 3 DAYS Performed at [facility], [place] [address], [place], [place] [address]    Report Status PENDING  Incomplete  CSF culture     Status: None (Preliminary result)   Collection Time: 10/07/18  1:43 AM  Result Value Ref Range Status   Specimen Description   Final    CSF Performed at [place] [address], [place], [place] [address]    Special Requests   Final    NONE Performed at [facility], [place]  [address] [place], [place] [address]    Gram Stain   Final    NO WBC SEEN NO ORGANISMS SEEN CYTOSPIN SMEAR RESULT CALLED TO, READ BACK BY AND VERIFIED WITH: [facility] RN AT 2025 10/07/18 BY TIBBITTS,K Performed at [facility], [place] [address], [place], [place] [address]    Culture   Final    NO GROWTH 2 DAYS Performed at [place] [address] [place], [place] [address]    Report Status PENDING  Incomplete  Culture, Urine     Status: None   Collection Time: 10/07/18  6:30 PM  Result Value Ref Range Status   Specimen Description   Final    URINE, RANDOM Performed at [place] [address], [place], [place] [address]    Special Requests   Final    NONE Performed at [facility], [place] [address], [place], [place] [address]    Culture   Final    NO GROWTH Performed at [facility], [place] [address], [place], [place] [address]    Report Status 10/09/2018 FINAL  Final     Labs: BNP (last 3 results) No results for input(s): BNP in the last 8760 hours. Basic Metabolic Panel: Recent Labs  Lab 10/06/18 1649 10/07/18 0500 10/08/18 0356  NA 134* 136 139  K 3.5  3.4* 3.8  CL 99 102 109  CO2 '26 23 24  ' GLUCOSE 100* 143* 110*  BUN '9 7 9  ' CREATININE 0.93 0.93 0.54  CALCIUM 9.2 8.2* 8.1*   Liver Function Tests: Recent Labs  Lab 10/06/18 1649 10/08/18 0356  AST 18 19  ALT 13 14  ALKPHOS 64 57  BILITOT 0.9 0.6  PROT 8.4* 6.3*  ALBUMIN 4.3 3.2*   No results for input(s): LIPASE, AMYLASE in the last 168 hours. No results for input(s): AMMONIA in the last 168 hours. CBC: Recent Labs  Lab 10/06/18 1649 10/07/18 0500 10/08/18 0356 10/09/18 0501  WBC 10.4 17.3* 21.3* 14.9*  NEUTROABS 8.3*  --   --   --   HGB 13.1 11.7* 10.0* 10.9*  HCT 40.0 35.4* 30.7* 34.1*  MCV 89.9 90.5 91.1 90.9  PLT 398 360 348 479*   Cardiac Enzymes: No results for input(s): CKTOTAL, CKMB, CKMBINDEX, TROPONINI in  the last 168 hours. BNP: Invalid input(s): POCBNP CBG: No results for input(s): GLUCAP in the last 168 hours. D-Dimer No results for input(s): DDIMER in the last 72 hours. Hgb A1c No results for input(s): HGBA1C in the last 72 hours. Lipid Profile No results for input(s): CHOL, HDL, LDLCALC, TRIG, CHOLHDL, LDLDIRECT in the last 72 hours. Thyroid function studies Recent Labs    10/06/18 1732  TSH 0.514   Anemia work up No results for input(s): VITAMINB12, FOLATE, FERRITIN, TIBC, IRON, RETICCTPCT in the last 72 hours. Urinalysis    Component Value Date/Time   COLORURINE YELLOW 10/06/2018 [place] 10/06/2018 1724   LABSPEC 1.021 10/06/2018 1724   PHURINE 7.0 10/06/2018 1724   GLUCOSEU NEGATIVE 10/06/2018 1724   HGBUR MODERATE (A) 10/06/2018 1724   BILIRUBINUR NEGATIVE 10/06/2018 1724   BILIRUBINUR neg 10/06/2018 1625   KETONESUR 5 (A) 10/06/2018 1724   PROTEINUR NEGATIVE 10/06/2018 1724   UROBILINOGEN 0.2 10/06/2018 1625   UROBILINOGEN 1.0 03/18/2015 1826   NITRITE NEGATIVE 10/06/2018 1724   LEUKOCYTESUR MODERATE (A) 10/06/2018 1724   Sepsis Labs Invalid input(s): PROCALCITONIN,  WBC,  LACTICIDVEN Microbiology Recent Results (from the past 240 hour(s))  Urine Culture     Status: None   Collection Time: 10/06/18  4:45 PM  Result Value Ref Range Status   MICRO NUMBER: [id number]  Final   SPECIMEN QUALITY: Adequate  Final   Sample Source URINE  Final   STATUS: FINAL  Final   ISOLATE 1:   Final    Three or more organisms present, each greater than 10,000 CFU/mL. May represent normal flora contamination from external genitalia. No further testing is required.  Group A Strep by PCR     Status: None   Collection Time: 10/06/18  6:27 PM  Result Value Ref Range Status   Group A Strep by PCR NOT DETECTED NOT DETECTED Final    Comment: Performed at [facility], [place] [address], [place], [place] [address]  Respiratory Panel by PCR     Status:  None   Collection Time: 10/06/18  6:27 PM  Result Value Ref Range Status   Adenovirus NOT DETECTED NOT DETECTED Final   Coronavirus 229E NOT DETECTED NOT DETECTED Final   Coronavirus HKU1 NOT DETECTED NOT DETECTED Final   Coronavirus NL63 NOT DETECTED NOT DETECTED Final   Coronavirus OC43 NOT DETECTED NOT DETECTED Final   Metapneumovirus NOT DETECTED NOT DETECTED Final   Rhinovirus / Enterovirus NOT DETECTED NOT DETECTED Final   Influenza A NOT DETECTED NOT DETECTED  Final   Influenza B NOT DETECTED NOT DETECTED Final   Parainfluenza Virus 1 NOT DETECTED NOT DETECTED Final   Parainfluenza Virus 2 NOT DETECTED NOT DETECTED Final   Parainfluenza Virus 3 NOT DETECTED NOT DETECTED Final   Parainfluenza Virus 4 NOT DETECTED NOT DETECTED Final   Respiratory Syncytial Virus NOT DETECTED NOT DETECTED Final   Bordetella pertussis NOT DETECTED NOT DETECTED Final   Chlamydophila pneumoniae NOT DETECTED NOT DETECTED Final   Mycoplasma pneumoniae NOT DETECTED NOT DETECTED Final    Comment: Performed at [facility], [place] [address] [place], [place] [address]  Culture, blood (routine x 2)     Status: None (Preliminary result)   Collection Time: 10/06/18  6:28 PM  Result Value Ref Range Status   Specimen Description   Final    BLOOD RIGHT ANTECUBITAL Performed at [place] [address], [place], [place] [address]    Special Requests   Final    BOTTLES DRAWN AEROBIC AND ANAEROBIC Blood Culture adequate volume Performed at [place] [address], [place], [place] [address]    Culture   Final    NO GROWTH 3 DAYS Performed at [facility], [place] [address], [place], [place] [address]    Report Status PENDING  Incomplete  Culture, blood (routine x 2)     Status: None (Preliminary result)   Collection Time: 10/06/18  6:28 PM  Result Value Ref Range Status   Specimen Description   Final    BLOOD LEFT ANTECUBITAL Performed at [place] [address] [place] [address], [place], [place] [address]    Special Requests   Final    BOTTLES DRAWN AEROBIC AND ANAEROBIC Blood Culture adequate volume Performed at [place] [address], [place], [place] [address]    Culture   Final    NO GROWTH 3 DAYS Performed at [facility], [place] [address], [place], [place] [address]    Report Status PENDING  Incomplete  CSF culture     Status: None (Preliminary result)   Collection Time: 10/07/18  1:43 AM  Result Value Ref Range Status   Specimen Description   Final    CSF Performed at [place] [address], [place], [place] [address]    Special Requests   Final    NONE Performed at [facility], [place] [address], [place], [place] [address]    Gram Stain   Final    NO WBC SEEN NO ORGANISMS SEEN CYTOSPIN SMEAR RESULT CALLED TO, READ BACK BY AND VERIFIED WITH: [facility] RN AT 5456 10/07/18 BY TIBBITTS,K Performed at [facility], [place] [address] [place], [place] [address]    Culture   Final    NO GROWTH 2 DAYS Performed at [place] [address] [place], [place] [address]    Report Status PENDING  Incomplete  Culture, Urine     Status: None   Collection Time: 10/07/18  6:30 PM  Result Value Ref Range Status   Specimen Description   Final    URINE, RANDOM Performed at [facility] [address], [place], [place] [address]    Special Requests   Final    NONE Performed at [facility], [place] [address] [place], [place] [address]    Culture   Final    NO GROWTH Performed at [facility], [place]  [address] [place], [place] [address]    Report Status 10/09/2018 FINAL  Final   Time spent: 1mn  SIGNED:   SMarylu Lund MD  [facility] 10/09/2018, 9:29 AM  If 7PM-7AM, please contact night-coverage

## 2018-10-10 LAB — CSF CULTURE W GRAM STAIN: Culture: NO GROWTH

## 2018-10-10 LAB — CSF CULTURE

## 2018-10-11 LAB — CULTURE, BLOOD (ROUTINE X 2)
Culture: NO GROWTH
Culture: NO GROWTH
Special Requests: ADEQUATE
Special Requests: ADEQUATE

## 2018-11-06 ENCOUNTER — Other Ambulatory Visit: Payer: Self-pay | Admitting: Family Medicine

## 2018-11-06 DIAGNOSIS — I10 Essential (primary) hypertension: Secondary | ICD-10-CM

## 2019-10-21 NOTE — L&D Delivery Note (Signed)
Delivery Note  Complete dilation: unknown Onset of pushing: N/A FHR second stage N/A Analgesia/Anesthesia intrapartum: none  Infant spont expelled with maternal sneezing during COVID swab. Delivery of a non-viable fetus that appears female at 42. Kelly clamp applied to cord.  Placenta remains attached, scant bleeding Cytotec 600 mcg vaginally  no laceration identified.  Anesthesia: none Repair: n/a EBL (mL): less than 25 ml  APGAR: APGAR (1 MIN): 0 APGAR (5 MINS): 0 APGAR (10 MINS): 0  Care transferred to Dr. Simona Huh and MD notified that placenta has not been delivered.   Arrie Eastern MSN, CNM 11/04/2019, 7:07 AM

## 2019-11-04 ENCOUNTER — Encounter (HOSPITAL_COMMUNITY): Payer: Self-pay | Admitting: Obstetrics and Gynecology

## 2019-11-04 ENCOUNTER — Other Ambulatory Visit: Payer: Self-pay

## 2019-11-04 ENCOUNTER — Observation Stay (HOSPITAL_COMMUNITY)
Admission: AD | Admit: 2019-11-04 | Discharge: 2019-11-04 | Disposition: A | Discharge status: Home / Self Care | Payer: BC Managed Care – PPO | Source: Ambulatory Visit | Attending: Obstetrics & Gynecology | Admitting: Obstetrics & Gynecology

## 2019-11-04 DIAGNOSIS — Z20822 Contact with and (suspected) exposure to covid-19: Secondary | ICD-10-CM | POA: Diagnosis not present

## 2019-11-04 DIAGNOSIS — I10 Essential (primary) hypertension: Secondary | ICD-10-CM | POA: Diagnosis not present

## 2019-11-04 DIAGNOSIS — Z3A17 17 weeks gestation of pregnancy: Secondary | ICD-10-CM | POA: Diagnosis not present

## 2019-11-04 DIAGNOSIS — O209 Hemorrhage in early pregnancy, unspecified: Secondary | ICD-10-CM | POA: Diagnosis present

## 2019-11-04 DIAGNOSIS — O034 Incomplete spontaneous abortion without complication: Secondary | ICD-10-CM

## 2019-11-04 DIAGNOSIS — O021 Missed abortion: Secondary | ICD-10-CM

## 2019-11-04 LAB — CBC
HCT: 34.5 % — ABNORMAL LOW (ref 36.0–46.0)
Hemoglobin: 11.8 g/dL — ABNORMAL LOW (ref 12.0–15.0)
MCH: 30.9 pg (ref 26.0–34.0)
MCHC: 34.2 g/dL (ref 30.0–36.0)
MCV: 90.3 fL (ref 80.0–100.0)
Platelets: 428 10*3/uL — ABNORMAL HIGH (ref 150–400)
RBC: 3.82 MIL/uL — ABNORMAL LOW (ref 3.87–5.11)
RDW: 13.8 % (ref 11.5–15.5)
WBC: 12 10*3/uL — ABNORMAL HIGH (ref 4.0–10.5)
nRBC: 0 % (ref 0.0–0.2)

## 2019-11-04 LAB — COMPREHENSIVE METABOLIC PANEL
ALT: 12 U/L (ref 0–44)
AST: 16 U/L (ref 15–41)
Albumin: 3.2 g/dL — ABNORMAL LOW (ref 3.5–5.0)
Alkaline Phosphatase: 69 U/L (ref 38–126)
Anion gap: 11 (ref 5–15)
BUN: 5 mg/dL — ABNORMAL LOW (ref 6–20)
CO2: 20 mmol/L — ABNORMAL LOW (ref 22–32)
Calcium: 9.3 mg/dL (ref 8.9–10.3)
Chloride: 104 mmol/L (ref 98–111)
Creatinine, Ser: 0.57 mg/dL (ref 0.44–1.00)
GFR calc Af Amer: >60 mL/min (ref >60)
GFR calc non Af Amer: >60 mL/min (ref >60)
Glucose, Bld: 92 mg/dL (ref 70–99)
Potassium: 3.5 mmol/L (ref 3.5–5.1)
Sodium: 135 mmol/L (ref 135–145)
Total Bilirubin: 0.7 mg/dL (ref 0.3–1.2)
Total Protein: 6.9 g/dL (ref 6.5–8.1)

## 2019-11-04 LAB — RESPIRATORY PANEL BY RT PCR (FLU A&B, COVID)
Influenza A by PCR: NEGATIVE
Influenza B by PCR: NEGATIVE
SARS Coronavirus 2 by RT PCR: NEGATIVE

## 2019-11-04 LAB — RPR: RPR Ser Ql: NONREACTIVE

## 2019-11-04 MED ORDER — CARBOPROST TROMETHAMINE 250 MCG/ML IM SOLN
250.0000 ug | Freq: Once | INTRAMUSCULAR | Status: AC
  Administered 2019-11-04: 250 ug via INTRAMUSCULAR
  Filled 2019-11-04: qty 1

## 2019-11-04 MED ORDER — OXYTOCIN 40 UNITS IN NORMAL SALINE INFUSION - SIMPLE MED: 1.0000 m[IU]/min | INTRAVENOUS | Status: DC

## 2019-11-04 MED ORDER — MISOPROSTOL 200 MCG PO TABS
ORAL_TABLET | ORAL | Status: AC
  Administered 2019-11-04: 600 ug via VAGINAL
  Filled 2019-11-04: qty 3

## 2019-11-04 MED ORDER — FENTANYL CITRATE (PF) 100 MCG/2ML IJ SOLN
50.0000 ug | INTRAMUSCULAR | Status: DC | PRN
  Filled 2019-11-04 (×2): qty 2

## 2019-11-04 MED ORDER — MISOPROSTOL 200 MCG PO TABS
800.0000 ug | ORAL_TABLET | Freq: Once | ORAL | Status: AC
  Administered 2019-11-04: 800 ug via RECTAL
  Filled 2019-11-04: qty 4

## 2019-11-04 MED ORDER — MISOPROSTOL 200 MCG PO TABS: 600.0000 ug | ORAL_TABLET | Freq: Once | ORAL | Status: AC

## 2019-11-04 MED ORDER — FENTANYL CITRATE (PF) 100 MCG/2ML IJ SOLN
50.0000 ug | INTRAMUSCULAR | Status: DC | PRN
  Administered 2019-11-04 (×3): 50 ug via INTRAVENOUS
  Filled 2019-11-04: qty 2

## 2019-11-04 MED ORDER — ACETAMINOPHEN 325 MG PO TABS: 650.0000 mg | ORAL_TABLET | ORAL | Status: DC | PRN

## 2019-11-04 MED ORDER — OXYTOCIN 40 UNITS IN NORMAL SALINE INFUSION - SIMPLE MED
INTRAVENOUS | Status: AC
  Administered 2019-11-04: 12:00:00 500 [IU]
  Filled 2019-11-04: qty 1000

## 2019-11-04 MED ORDER — SOD CITRATE-CITRIC ACID 500-334 MG/5ML PO SOLN
ORAL | Status: AC
  Filled 2019-11-04: qty 15

## 2019-11-04 MED ORDER — DIPHENOXYLATE-ATROPINE 2.5-0.025 MG PO TABS
2.0000 | ORAL_TABLET | Freq: Once | ORAL | Status: AC
  Administered 2019-11-04: 2 via ORAL
  Filled 2019-11-04: qty 2

## 2019-11-04 MED ORDER — CALCIUM CARBONATE ANTACID 500 MG PO CHEW: 2.0000 | CHEWABLE_TABLET | ORAL | Status: DC | PRN

## 2019-11-04 MED ORDER — ACETAMINOPHEN 500 MG PO TABS: 1000.0000 mg | ORAL_TABLET | Freq: Four times a day (QID) | ORAL | Status: DC | PRN

## 2019-11-04 MED ORDER — LACTATED RINGERS IV SOLN: INTRAVENOUS | Status: DC

## 2019-11-04 MED ORDER — IBUPROFEN 600 MG PO TABS: 600.0000 mg | ORAL_TABLET | Freq: Four times a day (QID) | ORAL | 0 refills | Status: DC | PRN

## 2019-11-04 NOTE — H&P (Signed)
OB ADMISSION/ HISTORY & PHYSICAL:  Admission Date: 11/04/2019  2:27 AM  Admit Diagnosis: IUFD @ 66 +2 wks  Kirsten Long is a 34 y.o. female [phone] [redacted]w[redacted]d who presented to bleeding. Pt states she noticed "mucousy" discharge on 10/29/19 and called the after hours service. Per pt, she was instructed to present to hospital if bleeding occurred follow-up w/ OB on 10/31/19. Pt called OB and was instructed to present to office or MAU if bleeding of LOF occurred. Pt reports intermittent watery discharge x4 days. While in the BR, pt felt something coming out of her vagina and pulled on it, noted bleeding, and presented to MAU. Fetal parts noted in vagina by MAU provider. I evaluated pt and discussed expectant management of IUFD. During COVID swab, pt sneezed and fetus spont ejected from vagina. Delivery of placenta pending. Pt and spouse did not want to see/hold fetus. Mother request chromosomal studies.   History of current pregnancy: [id number]   Primary Ob Provider: Dr. Simona Huh Patient entered care with [facility] at 8 wks.   EDC of 04/11/20 was established by LMP and congruent w/ 11 wk U/S.   Dx essential HTN 2019, prescribed Amlodipine, stopped taking ~06/20  Patient Active Problem List   Diagnosis Date Noted  . IUFD at less than 20 weeks of gestation 11/04/2019  . H/O ankylosing spondylitis 10/07/2018  . Headache 10/07/2018  . Neck pain 10/07/2018  . Ankylosing spondylitis of multiple sites in spine ([place]) 08/20/2017  . Sacroiliitis ([place]) 08/20/2017  . Iritis 08/20/2017  . History of panic attacks 08/20/2017  . Essential hypertension 08/20/2017  . History of cholecystectomy 08/20/2017  . Ex-smoker 08/20/2017    Prenatal Labs: ABO, Rh:   Antibody:   Rubella:   immune RPR:   non-reactive HBsAg:   negative HIV:   non-reactive  GTT: N/A GBS:   N/A GC/CHL: negative/negative    OB History  Gravida Para Term Preterm AB Living  5 3 3  0 1 3  SAB TAB Ectopic Multiple Live  Births  1 0 0 0 3    # Outcome Date GA Lbr Len/2nd Weight Sex Delivery Anes PTL Lv  5 Current           4 Term 09/22/12 [redacted]w[redacted]d 252:20 / 00:10 3900 g M Vag-Spont EPI  LIV  3 SAB 2010          2 Term 06/12/08        LIV  1 Term 07/26/05        LIV    Medical / Surgical History: Past medical history:  Past Medical History:  Diagnosis Date  . Arthritis   . HSV (herpes simplex virus) infection   . Hypertension   . Infection    Valtrex for HSV 2009   . Urinary tract infection     Past surgical history:  Past Surgical History:  Procedure Laterality Date  . CHOLECYSTECTOMY  10/15/2012   Procedure: LAPAROSCOPIC CHOLECYSTECTOMY WITH INTRAOPERATIVE CHOLANGIOGRAM;  Surgeon: Shann Medal, MD;  Location: [facility];  Service: General;  Laterality: N/A;   Family History:  Family History  Problem Relation Age of Onset  . Hypertension Mother   . Hypertension Maternal Aunt   . Hypertension Maternal Uncle   . Cancer Maternal Grandmother   . Hypertension Maternal Grandmother   . Hypertension Maternal Grandfather   . Hypertension Paternal Grandmother   . Stroke Paternal Grandmother   . Hypertension Paternal Grandfather   . Stroke Paternal Grandfather   . Hypertension  Father     Social History:  reports that she quit smoking about 7 years ago. She quit after 2.00 years of use. She has never used smokeless tobacco. She reports previous alcohol use. She reports that she does not use drugs.  Allergies: Patient has no known allergies.   Current Medications at time of admission:  Prior to Admission medications   Medication Sig Start Date End Date Taking? Authorizing Provider  Prenatal Vit-Fe Fumarate-FA (MULTIVITAMIN-PRENATAL) 27-0.8 MG TABS tablet Take 1 tablet by mouth daily at 12 noon.   Yes [provider]  amLODipine (NORVASC) 5 MG tablet TAKE 1 TABLET BY MOUTH EVERY DAY 11/10/18   Billie Ruddy, MD  aspirin-acetaminophen-caffeine (EXCEDRIN MIGRAINE) [id number] MG tablet Take 1  tablet by mouth every 6 (six) hours as needed for headache or migraine.    [provider]  ketorolac (TORADOL) 10 MG tablet Take 1 tablet (10 mg total) by mouth every 6 (six) hours as needed. 10/09/18   Donne Hazel, MD    Review of Systems: Constitutional: Negative   HENT: Negative   Eyes: Negative   Respiratory: Negative   Cardiovascular: Negative   Gastrointestinal: Negative  Genitourinary: positive for bloody show, positive for LOF   Musculoskeletal: Negative   Skin: Negative   Neurological: Negative   Endo/Heme/Allergies: Negative   Psychiatric/Behavioral: Negative    Physical Exam: VS: Blood pressure 135/88, pulse 83, temperature 98.2 F (36.8 C), resp. rate 16, height 5\' 3"  (1.6 m), weight 77.1 kg. AAO x3, no signs of distress  GU/GI: Abdomen gravid, non-tender, non-distended, Extremities: no edema, negative for pain, tenderness, and cords  Cervical exam: deferred, pt recently examined    Prenatal Transfer Tool  N.A  Assessment: 34 y.o. [id number] [redacted]w[redacted]d  IUFD S/P SVD non-viable fetus, possible female Retained placenta     -Cytotec 600 mcg vaginal     -hemodynamically stable Essential hypertension     -labile BP's   Plan:  Admit to [facility] Awaiting placenta Request chromosomal studies Placenta to pathology  Dr Charlesetta Garibaldi aware of admission, status and plan. Will update [facility] [place] 11/04/2019, 5:04 AM

## 2019-11-04 NOTE — Progress Notes (Signed)
Tech notified RN that placenta was half way delivered. MD notified

## 2019-11-04 NOTE — MAU Note (Signed)
Burman Foster CNM back in room. SVE done and pt pushed some but placenta did not deliver. Pt wanted to go to BR so mesh panties on and pt helped to BR due to having IV but ambulates well. Scant blood on pad. Cord clamped with umbilical clamp and kelly removed before pt up to BR.

## 2019-11-04 NOTE — Progress Notes (Signed)
Pt delivered at 4:30-5 am.  Placenta still in place. Pt denies heavy bleeding.  States she is not cramping.  No fevers or chills.  Requests something for mild pain. Gen: NAD Abd:  No fundal tenderness Clot around cord.  Pad moderately soiled. Cervix 4/100   CBC    Component Value Date/Time   WBC 12.0 (H) 11/04/2019 0435   RBC 3.82 (L) 11/04/2019 0435   HGB 11.8 (L) 11/04/2019 0435   HCT 34.5 (L) 11/04/2019 0435   PLT 428 (H) 11/04/2019 0435   MCV 90.3 11/04/2019 0435   MCH 30.9 11/04/2019 0435   MCHC 34.2 11/04/2019 0435   RDW 13.8 11/04/2019 0435   LYMPHSABS 1.1 10/06/2018 1649   MONOABS 0.9 10/06/2018 1649   EOSABS 0.0 10/06/2018 1649   BASOSABS 0.0 10/06/2018 1649   Administer Hemabate with Lomotil.  If no signs of passing placenta will do a D&C.

## 2019-11-04 NOTE — Plan of Care (Signed)
  Problem: Education: Goal: Knowledge of General Education information will improve Description: Including pain rating scale, medication(s)/side effects and non-pharmacologic comfort measures Outcome: Completed/Met

## 2019-11-04 NOTE — H&P (View-Only) (Signed)
Called to bedside b/c the placenta was partially delivered.  Upon Arrival, placenta was at the introitus. A small portion of it was in the cervix.  Pt pushed twice and it was expelled with a large clot.  No active bleeding noted.  On SVE, I felt a mass slightly protruding from the cervix, clot vs placenta. Pt given Fentanyl. Graves speculum in the vagina. Vault filled with blood.  Cervix visualized with clots.  Removed from the cervix.  Once they were removed, External os was visualized and there was no active bleeding. Bedside ultrasound done prior to removing clots and endometrial cavity did not appear to have any signs of placental tissue remaining.   Hemabate and Pitocin administered.   Pt tolerated the procedure well.

## 2019-11-04 NOTE — MAU Provider Note (Signed)
Chief Complaint:  Vaginal Bleeding                                                                                               Pietro Cassis MD  First Provider Initiated Contact with Patient 11/04/19 [phone]     HPI: Kirsten Long is a 34 y.o. [id number] at 47w2dwho presents to maternity admissions reporting passing mucous last Saturday (6 days ago) then watery discharge all week.  Tonight felt a loop of tissue and pulled on it, bleeding occurred and she came in. No further bleeding now.  Had one cramp, no pain now.  . She denies vaginal itching/burning, urinary symptoms, h/a, dizziness, n/v, diarrhea, constipation or fever/chills.    Vaginal Bleeding The patient's primary symptoms include vaginal bleeding and vaginal discharge. The patient's pertinent negatives include no genital itching, genital lesions, genital odor or pelvic pain. This is a new problem. The current episode started in the past 7 days. The problem occurs intermittently. The patient is experiencing no pain. She is pregnant. Associated symptoms include abdominal pain (one cramp). Pertinent negatives include no back pain, chills, constipation, diarrhea, dysuria, fever, nausea or vomiting. The vaginal discharge was clear and watery. There has been no bleeding.   RN note: Got in shower [phone] and saw something hanging out of me like a loop. Tried to pull on it and had some bleeding. Bleeding has slowed but still have something hanging out of me. Last Sat passed some mucousy stuff. Having some leakage of fld couple times a day since Monday. No pain now. Alittle cramping earlier but none now.    Past Medical History: Past Medical History:  Diagnosis Date  . Arthritis   . HSV (herpes simplex virus) infection   . Hypertension   . Infection    Valtrex for HSV 2009   . Urinary tract infection     Past obstetric history: OB History  Gravida Para Term Preterm AB Living  5 3 3  0 1 3  SAB TAB Ectopic Multiple Live Births   1 0 0 0 3    # Outcome Date GA Lbr Len/2nd Weight Sex Delivery Anes PTL Lv  5 Current           4 Term 09/22/12 [redacted]w[redacted]d 252:20 / 00:10 3900 g M Vag-Spont EPI  LIV  3 SAB 2010          2 Term 06/12/08        LIV  1 Term 07/26/05        LIV    Past Surgical History: Past Surgical History:  Procedure Laterality Date  . CHOLECYSTECTOMY  10/15/2012   Procedure: LAPAROSCOPIC CHOLECYSTECTOMY WITH INTRAOPERATIVE CHOLANGIOGRAM;  Surgeon: Shann Medal, MD;  Location: [facility];  Service: General;  Laterality: N/A;    Family History: Family History  Problem Relation Age of Onset  . Hypertension Mother   . Hypertension Maternal Aunt   . Hypertension Maternal Uncle   . Cancer Maternal Grandmother   . Hypertension Maternal Grandmother   . Hypertension Maternal Grandfather   . Hypertension Paternal Grandmother   . Stroke Paternal Grandmother   .  Hypertension Paternal Grandfather   . Stroke Paternal Grandfather   . Hypertension Father     Social History: Social History   Tobacco Use  . Smoking status: Former Smoker    Years: 2.00    Quit date: 12/16/2011    Years since quitting: 7.8  . Smokeless tobacco: Never Used  Substance Use Topics  . Alcohol use: Not Currently    Comment: occassionally  . Drug use: No    Allergies: No Known Allergies  Meds:  Medications Prior to Admission  Medication Sig Dispense Refill Last Dose  . Prenatal Vit-Fe Fumarate-FA (MULTIVITAMIN-PRENATAL) 27-0.8 MG TABS tablet Take 1 tablet by mouth daily at 12 noon.   11/03/2019 at Unknown time  . amLODipine (NORVASC) 5 MG tablet TAKE 1 TABLET BY MOUTH EVERY DAY 30 tablet 3  at Not taking  . aspirin-acetaminophen-caffeine (EXCEDRIN MIGRAINE) 250-250-65 MG tablet Take 1 tablet by mouth every 6 (six) hours as needed for headache or migraine.    at Not taking  . ketorolac (TORADOL) 10 MG tablet Take 1 tablet (10 mg total) by mouth every 6 (six) hours as needed. 20 tablet 0  at Not taking    I have reviewed  patient's Past Medical Hx, Surgical Hx, Family Hx, Social Hx, medications and allergies.   ROS:  Review of Systems  Constitutional: Negative for chills and fever.  Gastrointestinal: Positive for abdominal pain (one cramp). Negative for constipation, diarrhea, nausea and vomiting.  Genitourinary: Positive for vaginal bleeding and vaginal discharge. Negative for dysuria and pelvic pain.  Musculoskeletal: Negative for back pain.   Other systems negative  Physical Exam   Patient Vitals for the past 24 hrs:  BP Temp Resp Height Weight  11/04/19 0239 (!) 140/94 98.2 F (36.8 C) 16 5\' 3"  (1.6 m) 77.1 kg   Constitutional: Well-developed, well-nourished female in no acute distress.  Cardiovascular: normal rate and rhythm Respiratory: normal effort, clear to auscultation bilaterally GI: Abd soft, non-tender, gravid appropriate for gestational age.   No rebound or guarding. MS: Extremities nontender, no edema, normal ROM Neurologic: Alert and oriented x 4.  GU: Neg CVAT.  PELVIC EXAM:  Umbilical cord (one side avulsed) protruding from vagina Sterile speculum exam showed fetal parts in cervix.  Unable to see entirety of cervical dilation.   No bleeding, no fluid leaking   FHT:  Unable to auscultate   Labs: No results found for this or any previous visit (from the past 24 hour(s)).    Imaging:  No results found.  MAU Course/MDM: Consult Dr Charlesetta Garibaldi with presentation, exam findings and test results.   She recommended admission .  CNM will admit.   I informed patient of presentation and what was seen on exam.;   Very distraught.  Did not have any idea this was the case.  .    Assessment: Single intrauterine pregnancy at [redacted]w[redacted]d Cervical incompetence and/or PPROM Advanced dilation of cervix  Plan: Admit to [facility] to follow    Hansel Feinstein CNM, MSN Certified Nurse-Midwife 11/04/2019 3:08 AM

## 2019-11-04 NOTE — MAU Note (Addendum)
Covid swab obtained without difficulty and pt tol well. No symptoms. Pt sneezed right after covid swab obtained and delivered baby. Baby with no signs of life at delivery. No bleeding. Baby was separated from cord at delivery. Pt and FOB looked at baby but did not want to hold baby or see baby anymore at the time. Burman Foster CNM on unit and came into room. Small kelly attached to cord extending from vagina. No bleeding. Clois Dupes CNM had pt bare down and did sve but placenta did not deliver. Pt having intermittent cramping

## 2019-11-04 NOTE — Discharge Summary (Signed)
    OB Discharge Summary     Patient Name: Kirsten Long [facility] DOB: 09-29-1986 MRN: [id number]  Date of admission: 11/04/2019 Delivering MD: This patient has no babies on file.  Date of discharge: 11/04/2019  Admitting diagnosis: IUFD at less than 20 weeks of gestation [O02.1], suspected PPROM Intrauterine pregnancy: [redacted]w[redacted]d     Secondary diagnosis:  Active Problems:   IUFD at less than 20 weeks of gestation  Additional problems: AMA     Discharge diagnosis: Retained Placenta                                                                                                Post partum procedures:None  Augmentation: Cytotec x 2, Hemabatex1, Postpartum-Hemabate, Pitocin  Complications: None  Hospital course:  Cord outside of the vagina on arrival.  Pt delivered fetus in MAU.  Several uterotonics were given. Pt eventually delivered placenta at ~ 6 hours.Ultrasound showed no retained products.   Physical exam  Vitals:   11/04/19 1350 11/04/19 1400 11/04/19 1449 11/04/19 1600  BP: 130/83 128/84 126/82 130/81  Pulse: 66 74 74 77  Resp: 18  18 18   Temp: 97.8 F (36.6 C)  98.1 F (36.7 C) 98.3 F (36.8 C)  TempSrc: Oral  Oral Oral  SpO2: 99% 99% 100% 100%  Weight:      Height:       General: alert, cooperative and no distress Lochia: minimal per RN Uterine Fundus: firm  Labs: Lab Results  Component Value Date   WBC 12.0 (H) 11/04/2019   HGB 11.8 (L) 11/04/2019   HCT 34.5 (L) 11/04/2019   MCV 90.3 11/04/2019   PLT 428 (H) 11/04/2019   CMP Latest Ref Rng & Units 11/04/2019  Glucose 70 - 99 mg/dL 92  BUN 6 - 20 mg/dL 5(L)  Creatinine 0.44 - 1.00 mg/dL 0.57  Sodium 135 - 145 mmol/L 135  Potassium 3.5 - 5.1 mmol/L 3.5  Chloride 98 - 111 mmol/L 104  CO2 22 - 32 mmol/L 20(L)  Calcium 8.9 - 10.3 mg/dL 9.3  Total Protein 6.5 - 8.1 g/dL 6.9  Total Bilirubin 0.3 - 1.2 mg/dL 0.7  Alkaline Phos 38 - 126 U/L 69  AST 15 - 41 U/L 16  ALT 0 - 44 U/L 12    Discharge  instruction: per After Visit Summary and "Baby and Me Booklet".  After visit meds:  Ibuprofen 600 mg po q 6 hours prn pain PNV  Diet: routine diet  Activity: Advance as tolerated. Pelvic rest for 6 weeks.   Outpatient follow up:2 weeks Follow up Appt:No future appointments. Follow up Visit:No follow-ups on file.  Postpartum contraception: Not Discussed   11/04/2019 Thurnell Lose, MD

## 2019-11-04 NOTE — MAU Note (Signed)
Got in shower 0115 and saw something hanging out of me like a loop. Tried to pull on it and had some bleeding. Bleeding has slowed but still have something hanging out of me. Last Sat passed some mucousy stuff. Having some leakage of fld couple times a day since Monday. No pain now. Alittle cramping earlier but none now.

## 2019-11-04 NOTE — Progress Notes (Signed)
I offered grief support to Kirsten Long after the loss of her baby. The family has had several significant losses recently, including the loss of Kirsten Long's father in December. We talked about coping skills and resources for grief counseling for both her children (14, 3 and 7) as well as for her and her husband.    Ryan Park, Bcc Pager, [phone] 4:09 PM    11/04/19 1600  Clinical Encounter Type  Visited With Patient and family together  Visit Type Spiritual support  Spiritual Encounters  Spiritual Needs Grief support  Stress Factors  Patient Stress Factors Loss

## 2019-11-04 NOTE — Progress Notes (Signed)
Placenta delivered at 1145. MD at bedside

## 2019-11-04 NOTE — Progress Notes (Addendum)
Called to bedside b/c the placenta was partially delivered.  Upon Arrival, placenta was at the introitus. A small portion of it was in the cervix.  Pt pushed twice and it was expelled with a large clot.  No active bleeding noted.  On SVE, I felt a mass slightly protruding from the cervix, clot vs placenta. Pt given Fentanyl. Graves speculum in the vagina. Vault filled with blood.  Cervix visualized with clots.  Removed from the cervix.  Once they were removed, External os was visualized and there was no active bleeding. Bedside ultrasound done prior to removing clots and endometrial cavity did not appear to have any signs of placental tissue remaining.   Hemabate and Pitocin administered.   Pt tolerated the procedure well.

## 2019-11-04 NOTE — Discharge Instructions (Signed)
Managing Pregnancy Loss Pregnancy loss can happen any time during a pregnancy. Often the cause is not known. It is rarely because of anything you did. Pregnancy loss in early pregnancy (during the first trimester) is called a miscarriage. This type of pregnancy loss is the most common. Pregnancy loss that happens after 20 weeks of pregnancy is called fetal demise if the baby's heart stops beating before birth. Fetal demise is much less common. Some women experience spontaneous labor shortly after fetal demise resulting in a stillborn birth (stillbirth). Any pregnancy loss can be devastating. You will need to recover both physically and emotionally. Most women are able to get pregnant again after a pregnancy loss and deliver a healthy baby. How to manage emotional recovery  Pregnancy loss is very hard emotionally. You may feel many different emotions while you grieve. You may feel sad and angry. You may also feel guilty. It is normal to have periods of crying. Emotional recovery can take longer than physical recovery. It is different for everyone. Taking these steps can help you in managing this loss:  Remember that it is unlikely you did anything to cause the pregnancy loss.  Share your thoughts and feelings with friends, family, and your partner. Remember that your partner is also recovering emotionally.  Make sure you have a good support system. Do not spend too much time alone.  Meet with a pregnancy loss counselor or join a pregnancy loss support group.  Get enough sleep and eat a healthy diet. Return to regular exercise when you have recovered physically.  Do not use drugs or alcohol to manage your emotions.  Consider seeing a mental health professional to help you recover emotionally.  Ask a friend or loved one to help you decide what to do with any clothing and nursery items you received for your baby. In the case of a stillbirth, many women benefit from taking additional steps in the  grieving process. You may want to:  Hold your baby after the birth.  Name your baby.  Request a birth certificate.  Create a keepsake such as handprints or footprints.  Dress your baby and have a picture taken.  Make funeral arrangements.  Ask for a baptism or blessing. Hospitals have staff members who can help you with all these arrangements. How to recognize emotional stress It is normal to have emotional stress after a pregnancy loss. But emotional stress that lasts a long time or becomes severe requires treatment. Watch out for these signs of severe emotional stress:  Sadness, anger, or guilt that is not going away and is interfering with your normal activities.  Relationship problems that have occurred or gotten worse since the pregnancy loss.  Signs of depression that last longer than 2 weeks. These may include: ? Sadness. ? Anxiety. ? Hopelessness. ? Loss of interest in activities you enjoy. ? Inability to concentrate. ? Trouble sleeping or sleeping too much. ? Loss of appetite or overeating. ? Thoughts of death or of hurting yourself. Follow these instructions at home:  Take over-the-counter and prescription medicines only as told by your health care provider.  Rest at home until your energy level returns. Return to your normal activities as told by your health care provider. Ask your health care provider what activities are safe for you.  When you are ready, meet with your health care provider to discuss steps to take for a future pregnancy.  Keep all follow-up visits as told by your health care provider. This is important.  Where to find support  To help you and your partner with the process of grieving, talk with your health care provider or seek counseling.  Consider meeting with others who have experienced pregnancy loss. Ask your health care provider about support groups and resources. Where to find more information  [place] Department of Health and Human  Services Office on Women's Health: www.womenshealth.gov  American Pregnancy Association: www.americanpregnancy.org Contact a health care provider if:  You continue to experience grief, sadness, or lack of motivation for everyday activities, and those feelings do not improve over time.  You are struggling to recover emotionally, especially if you are using alcohol or substances to help. Get help right away if:  You have thoughts of hurting yourself or others. If you ever feel like you may hurt yourself or others, or have thoughts about taking your own life, get help right away. You can go to your nearest emergency department or call:  Your local emergency services (911 in the [place]  A suicide crisis helpline, such as the National Suicide Prevention Lifeline at 1-800-273-8255. This is open 24 hours a day. Summary  Any pregnancy loss can be difficult physically and emotionally.  You may experience many different emotions while you grieve. Emotional recovery can last longer than physical recovery.  It is normal to have emotional stress after a pregnancy loss. But emotional stress that lasts a long time or becomes severe requires treatment.  See your health care provider if you are struggling emotionally after a pregnancy loss. This information is not intended to replace advice given to you by your health care provider. Make sure you discuss any questions you have with your health care provider. Document Revised: 01/26/2019 Document Reviewed: 12/17/2017 Elsevier Patient Education  2020 Elsevier Inc.  

## 2019-11-07 LAB — SURGICAL PATHOLOGY

## 2019-11-08 LAB — SURGICAL PATHOLOGY

## 2019-11-13 ENCOUNTER — Encounter (HOSPITAL_COMMUNITY): Payer: Self-pay | Admitting: Obstetrics and Gynecology

## 2019-11-13 ENCOUNTER — Inpatient Hospital Stay (HOSPITAL_COMMUNITY)
Admission: AD | Admit: 2019-11-13 | Discharge: 2019-11-13 | Disposition: A | Discharge status: Home / Self Care | Payer: BC Managed Care – PPO | Attending: Obstetrics and Gynecology | Admitting: Obstetrics and Gynecology

## 2019-11-13 ENCOUNTER — Inpatient Hospital Stay (HOSPITAL_COMMUNITY): Payer: BC Managed Care – PPO

## 2019-11-13 ENCOUNTER — Other Ambulatory Visit: Payer: Self-pay

## 2019-11-13 DIAGNOSIS — O8612 Endometritis following delivery: Secondary | ICD-10-CM | POA: Diagnosis not present

## 2019-11-13 DIAGNOSIS — O26892 Other specified pregnancy related conditions, second trimester: Secondary | ICD-10-CM | POA: Insufficient documentation

## 2019-11-13 DIAGNOSIS — Z3A19 19 weeks gestation of pregnancy: Secondary | ICD-10-CM | POA: Insufficient documentation

## 2019-11-13 DIAGNOSIS — O10012 Pre-existing essential hypertension complicating pregnancy, second trimester: Secondary | ICD-10-CM | POA: Diagnosis not present

## 2019-11-13 DIAGNOSIS — Z8759 Personal history of other complications of pregnancy, childbirth and the puerperium: Secondary | ICD-10-CM | POA: Insufficient documentation

## 2019-11-13 DIAGNOSIS — R1030 Lower abdominal pain, unspecified: Secondary | ICD-10-CM | POA: Diagnosis not present

## 2019-11-13 DIAGNOSIS — R109 Unspecified abdominal pain: Secondary | ICD-10-CM

## 2019-11-13 DIAGNOSIS — IMO0002 Reserved for concepts with insufficient information to code with codable children: Secondary | ICD-10-CM

## 2019-11-13 DIAGNOSIS — Z87891 Personal history of nicotine dependence: Secondary | ICD-10-CM | POA: Insufficient documentation

## 2019-11-13 DIAGNOSIS — R519 Headache, unspecified: Secondary | ICD-10-CM | POA: Insufficient documentation

## 2019-11-13 DIAGNOSIS — M1990 Unspecified osteoarthritis, unspecified site: Secondary | ICD-10-CM | POA: Insufficient documentation

## 2019-11-13 LAB — CBC WITH DIFFERENTIAL/PLATELET
Abs Immature Granulocytes: 0.05 10*3/uL (ref 0.00–0.07)
Basophils Absolute: 0 10*3/uL (ref 0.0–0.1)
Basophils Relative: 0 %
Eosinophils Absolute: 0.1 10*3/uL (ref 0.0–0.5)
Eosinophils Relative: 1 %
HCT: 28.2 % — ABNORMAL LOW (ref 36.0–46.0)
Hemoglobin: 9.4 g/dL — ABNORMAL LOW (ref 12.0–15.0)
Immature Granulocytes: 1 %
Lymphocytes Relative: 15 %
Lymphs Abs: 1.7 10*3/uL (ref 0.7–4.0)
MCH: 30.1 pg (ref 26.0–34.0)
MCHC: 33.3 g/dL (ref 30.0–36.0)
MCV: 90.4 fL (ref 80.0–100.0)
Monocytes Absolute: 0.8 10*3/uL (ref 0.1–1.0)
Monocytes Relative: 8 %
Neutro Abs: 8.3 10*3/uL — ABNORMAL HIGH (ref 1.7–7.7)
Neutrophils Relative %: 75 %
Platelets: 614 10*3/uL — ABNORMAL HIGH (ref 150–400)
RBC: 3.12 MIL/uL — ABNORMAL LOW (ref 3.87–5.11)
RDW: 13.6 % (ref 11.5–15.5)
WBC: 10.9 10*3/uL — ABNORMAL HIGH (ref 4.0–10.5)
nRBC: 0 % (ref 0.0–0.2)

## 2019-11-13 LAB — URINALYSIS, ROUTINE W REFLEX MICROSCOPIC
Bilirubin Urine: NEGATIVE
Glucose, UA: NEGATIVE mg/dL
Ketones, ur: NEGATIVE mg/dL
Nitrite: NEGATIVE
Protein, ur: 30 mg/dL — AB
Specific Gravity, Urine: 1.017 (ref 1.005–1.030)
WBC, UA: >50 WBC/hpf — ABNORMAL HIGH (ref 0–5)
pH: 8 (ref 5.0–8.0)

## 2019-11-13 LAB — WET PREP, GENITAL
Clue Cells Wet Prep HPF POC: NONE SEEN
Sperm: NONE SEEN
Trich, Wet Prep: NONE SEEN
Yeast Wet Prep HPF POC: NONE SEEN

## 2019-11-13 MED ORDER — METHYLERGONOVINE MALEATE 0.2 MG PO TABS: 0.2000 mg | ORAL_TABLET | Freq: Four times a day (QID) | ORAL | 0 refills | Status: DC

## 2019-11-13 MED ORDER — KETOROLAC TROMETHAMINE 30 MG/ML IJ SOLN
INTRAMUSCULAR | Status: AC
  Administered 2019-11-13: 30 mg via INTRAVENOUS
  Filled 2019-11-13: qty 1

## 2019-11-13 MED ORDER — AMOXICILLIN-POT CLAVULANATE 875-125 MG PO TABS: 1.0000 | ORAL_TABLET | Freq: Two times a day (BID) | ORAL | 0 refills | Status: AC

## 2019-11-13 MED ORDER — KETOROLAC TROMETHAMINE 10 MG PO TABS: 10.0000 mg | ORAL_TABLET | Freq: Four times a day (QID) | ORAL | 0 refills | Status: DC | PRN

## 2019-11-13 MED ORDER — LACTATED RINGERS IV SOLN: INTRAVENOUS | Status: DC

## 2019-11-13 MED ORDER — KETOROLAC TROMETHAMINE 30 MG/ML IJ SOLN: 30.0000 mg | Freq: Once | INTRAMUSCULAR | Status: AC

## 2019-11-13 MED ORDER — METHYLERGONOVINE MALEATE 0.2 MG PO TABS
0.2000 mg | ORAL_TABLET | Freq: Once | ORAL | Status: AC
  Administered 2019-11-13: 0.2 mg via ORAL
  Filled 2019-11-13: qty 1

## 2019-11-13 MED ORDER — SODIUM CHLORIDE 0.9 % IV SOLN
3.0000 g | Freq: Once | INTRAVENOUS | Status: AC
  Administered 2019-11-13: 3 g via INTRAVENOUS
  Filled 2019-11-13: qty 8
  Filled 2019-11-13: qty 3

## 2019-11-13 NOTE — MAU Provider Note (Signed)
Chief Complaint: Abdominal Pain and Headache   First Provider Initiated Contact with Patient 11/13/19 1633     SUBJECTIVE HPI: Kirsten Long is a 34 y.o. [id number] at 9 days S/P 17 week SAB on 11/04/19 who presents to Maternity Admissions reporting low abd pain since 11/08/19.   Location: LLQ radiating across entire low abd Quality: crampy w/ sharp exacerbations. States it feels like labor.  Severity: 3/10 on pain scale now w/ exacerbation to 10/10 Duration: 5 days Context: S/P second trimester SAB Timing: intermittent Modifying factors: Minimal improvement w/ IBU Associated signs and symptoms: Pos for ? Mild chills and foul smelling discharge. Didn't check temp. Pos for constipation since SAB. Had Nml BM today. Pain did not resolve afterward. Neg for urinary complaints, N/V/D.  Past Medical History:  Diagnosis Date  . Arthritis   . HSV (herpes simplex virus) infection   . Hypertension   . Infection    Valtrex for HSV 2009   . Urinary tract infection    OB History  Gravida Para Term Preterm AB Living  5 3 3  0 1 3  SAB TAB Ectopic Multiple Live Births  1 0 0 0 3    # Outcome Date GA Lbr Len/2nd Weight Sex Delivery Anes PTL Lv  5 Current 11/03/2019          4 Term 09/22/12 [redacted]w[redacted]d 252:20 / 00:10 3900 g M Vag-Spont EPI  LIV  3 SAB 2010          2 Term 06/12/08        LIV  1 Term 07/26/05        LIV   Past Surgical History:  Procedure Laterality Date  . CHOLECYSTECTOMY  10/15/2012   Procedure: LAPAROSCOPIC CHOLECYSTECTOMY WITH INTRAOPERATIVE CHOLANGIOGRAM;  Surgeon: Shann Medal, MD;  Location: [facility];  Service: General;  Laterality: N/A;   Social History   Socioeconomic History  . Marital status: Married    Spouse name: Not on file  . Number of children: Not on file  . Years of education: Not on file  . Highest education level: Not on file  Occupational History  . Not on file  Tobacco Use  . Smoking status: Former Smoker    Years: 2.00    Quit date:  12/16/2011    Years since quitting: 7.9  . Smokeless tobacco: Never Used  Substance and Sexual Activity  . Alcohol use: Not Currently    Comment: occassionally  . Drug use: No  . Sexual activity: Yes    Birth control/protection: None  Other Topics Concern  . Not on file  Social History Narrative  . Not on file   Social Determinants of Health   Financial Resource Strain:   . Difficulty of Paying Living Expenses: Not on file  Food Insecurity:   . Worried About Charity fundraiser in the Last Year: Not on file  . Ran Out of Food in the Last Year: Not on file  Transportation Needs:   . Lack of Transportation (Medical): Not on file  . Lack of Transportation (Non-Medical): Not on file  Physical Activity:   . Days of Exercise per Week: Not on file  . Minutes of Exercise per Session: Not on file  Stress:   . Feeling of Stress : Not on file  Social Connections:   . Frequency of Communication with Friends and Family: Not on file  . Frequency of Social Gatherings with Friends and Family: Not on file  . Attends Religious  Services: Not on file  . Active Member of Clubs or Organizations: Not on file  . Attends Archivist Meetings: Not on file  . Marital Status: Not on file  Intimate Partner Violence:   . Fear of Current or Ex-Partner: Not on file  . Emotionally Abused: Not on file  . Physically Abused: Not on file  . Sexually Abused: Not on file   Family History  Problem Relation Age of Onset  . Hypertension Mother   . Hypertension Maternal Aunt   . Hypertension Maternal Uncle   . Cancer Maternal Grandmother   . Hypertension Maternal Grandmother   . Hypertension Maternal Grandfather   . Hypertension Paternal Grandmother   . Stroke Paternal Grandmother   . Hypertension Paternal Grandfather   . Stroke Paternal Grandfather   . Hypertension Father    No current facility-administered medications on file prior to encounter.   Current Outpatient Medications on File Prior  to Encounter  Medication Sig Dispense Refill  . acetaminophen (TYLENOL) 500 MG tablet Take 1,000 mg by mouth every 6 (six) hours as needed for headache.    . ibuprofen (ADVIL) 600 MG tablet Take 1 tablet (600 mg total) by mouth every 6 (six) hours as needed. 30 tablet 0  . Prenatal Vit-Fe Fumarate-FA (PRENATAL MULTIVITAMIN) TABS tablet Take 1 tablet by mouth at bedtime.     No Known Allergies  I have reviewed patient's Past Medical Hx, Surgical Hx, Family Hx, Social Hx, medications and allergies.   Review of Systems  Constitutional: Positive for chills. Negative for fever.  Eyes: Negative for visual disturbance.  Gastrointestinal: Positive for abdominal pain and constipation. Negative for abdominal distention, diarrhea, nausea and vomiting.  Genitourinary: Positive for pelvic pain, vaginal bleeding and vaginal discharge (malodorous). Negative for difficulty urinating, dysuria, flank pain, frequency and hematuria.  Musculoskeletal: Negative for back pain and myalgias.  Neurological: Positive for headaches.    OBJECTIVE Patient Vitals for the past 24 hrs:  BP Temp Temp src Pulse Resp SpO2 Height Weight  11/13/19 1822 123/82 98.8 F (37.1 C) Oral 91 16 -- -- --  11/13/19 1530 128/81 99.9 F (37.7 C) Oral 95 16 99 % -- --  11/13/19 1525 -- -- -- -- -- -- 5\' 3"  (1.6 m) 76.2 kg   Constitutional: Well-developed, well-nourished female in mild distress.  Cardiovascular: normal rate Respiratory: normal rate and effort.  GI: Abd soft, severe uterine tenderness. Fundus 2/SP. Pos BS x 4 MS: Extremities nontender, no edema, normal ROM Neurologic: Alert and oriented x 4.  GU:  SPECULUM EXAM: NEFG, moderate amount of thick, very malodorous, purulent discharge, small amount of blood in vault, cervix clean, non-friable. No tissue visible in os.   BIMANUAL: cervix closedd; uterus 14-week size, severe CMT. No adnexal masses.   LAB RESULTS Results for orders placed or performed during the hospital  encounter of 11/13/19 (from the past 24 hour(s))  Urinalysis, Routine w reflex microscopic     Status: Abnormal   Collection Time: 11/13/19  3:43 PM  Result Value Ref Range   Color, Urine YELLOW YELLOW   APPearance HAZY (A) CLEAR   Specific Gravity, Urine 1.017 1.005 - 1.030   pH 8.0 5.0 - 8.0   Glucose, UA NEGATIVE NEGATIVE mg/dL   Hgb urine dipstick MODERATE (A) NEGATIVE   Bilirubin Urine NEGATIVE NEGATIVE   Ketones, ur NEGATIVE NEGATIVE mg/dL   Protein, ur 30 (A) NEGATIVE mg/dL   Nitrite NEGATIVE NEGATIVE   Leukocytes,Ua LARGE (A) NEGATIVE   RBC /  HPF 21-50 0 - 5 RBC/hpf   WBC, UA >50 (H) 0 - 5 WBC/hpf   Bacteria, UA RARE (A) NONE SEEN   Squamous Epithelial / LPF 0-5 0 - 5   Mucus PRESENT   Wet prep, genital     Status: Abnormal   Collection Time: 11/13/19  4:06 PM   Specimen: Genital  Result Value Ref Range   Yeast Wet Prep HPF POC NONE SEEN NONE SEEN   Trich, Wet Prep NONE SEEN NONE SEEN   Clue Cells Wet Prep HPF POC NONE SEEN NONE SEEN   WBC, Wet Prep HPF POC MANY (A) NONE SEEN   Sperm NONE SEEN   CBC with Differential/Platelet     Status: Abnormal   Collection Time: 11/13/19  4:38 PM  Result Value Ref Range   WBC 10.9 (H) 4.0 - 10.5 K/uL   RBC 3.12 (L) 3.87 - 5.11 MIL/uL   Hemoglobin 9.4 (L) 12.0 - 15.0 g/dL   HCT 28.2 (L) 36.0 - 46.0 %   MCV 90.4 80.0 - 100.0 fL   MCH 30.1 26.0 - 34.0 pg   MCHC 33.3 30.0 - 36.0 g/dL   RDW 13.6 11.5 - 15.5 %   Platelets 614 (H) 150 - 400 K/uL   nRBC 0.0 0.0 - 0.2 %   Neutrophils Relative % 75 %   Neutro Abs 8.3 (H) 1.7 - 7.7 K/uL   Lymphocytes Relative 15 %   Lymphs Abs 1.7 0.7 - 4.0 K/uL   Monocytes Relative 8 %   Monocytes Absolute 0.8 0.1 - 1.0 K/uL   Eosinophils Relative 1 %   Eosinophils Absolute 0.1 0.0 - 0.5 K/uL   Basophils Relative 0 %   Basophils Absolute 0.0 0.0 - 0.1 K/uL   Immature Granulocytes 1 %   Abs Immature Granulocytes 0.05 0.00 - 0.07 K/uL    IMAGING US PELVIS TRANSVAGINAL NON-OB (TV ONLY)  Result  Date: 11/13/2019 CLINICAL DATA:  Abdominal pain.  History of miscarriage. EXAM: ULTRASOUND PELVIS TRANSVAGINAL TECHNIQUE: Transvaginal ultrasound examination of the pelvis was performed including evaluation of the uterus, ovaries, adnexal regions, and pelvic cul-de-sac. COMPARISON:  None. FINDINGS: Uterus Measurements: 11.5 x 8.5 x 6 cm = volume: 323 mL. Fibroid within the LEFT lateral myometrium measuring 3.2 cm. Endometrium Thickness: Heterogeneously thickened, with internal vascularity, measuring 2.6 cm diameter. Right ovary Measurements: 3.7 x 2.2 x 2 cm = volume: 8.3 mL. Normal appearance/no adnexal mass. Left ovary Measurements: 3.7 x 3.2 x 3.5 cm = volume: 21 mL. Normal appearance/no adnexal mass. Other findings:  No abnormal free fluid IMPRESSION: 1. Endometrium is heterogeneously thickened, measuring 2.6 cm diameter. Given the history of recent miscarriage, this is highly suspicious for retained products of conception. 2. No intrauterine pregnancy identified. 3. No adnexal mass or free fluid. Electronically Signed   By: Franki Cabot M.D.   On: 11/13/2019 17:45    MAU COURSE Orders Placed This Encounter  Procedures  . Wet prep, genital  . Urine Culture  . US PELVIS TRANSVAGINAL NON-OB (TV ONLY)  . Urinalysis, Routine w reflex microscopic  . CBC with Differential/Platelet  . Diet NPO time specified  . Insert peripheral IV   Meds ordered this encounter  Medications  . Ampicillin-Sulbactam (UNASYN) 3 g in sodium chloride 0.9 % 100 mL IVPB    Order Specific Question:   Antibiotic Indication:    Answer:   Other Indication (list below)    Order Specific Question:   Other Indication:    Answer:  Late PP endometritis  . ketorolac (TORADOL) 30 MG/ML injection 30 mg  . lactated ringers infusion  . ketorolac (TORADOL) 30 MG/ML injection    Melanee Spry, Ch: cabinet override  . methylergonovine (METHERGINE) tablet 0.2 mg  . methylergonovine (METHERGINE) 0.2 MG tablet    Sig: Take 1  tablet (0.2 mg total) by mouth 4 (four) times daily.    Dispense:  4 tablet    Refill:  0    Order Specific Question:   Supervising Provider    Answer:   Donnamae Jude [id number]  . ketorolac (TORADOL) 10 MG tablet    Sig: Take 1 tablet (10 mg total) by mouth every 6 (six) hours as needed.    Dispense:  20 tablet    Refill:  0    Order Specific Question:   Supervising Provider    Answer:   Donnamae Jude [id number]  . amoxicillin-clavulanate (AUGMENTIN) 875-125 MG tablet    Sig: Take 1 tablet by mouth every 12 (twelve) hours for 10 days.    Dispense:  20 tablet    Refill:  0    Order Specific Question:   Supervising Provider    Answer:   Donnamae Jude [id number]    MDM - Endometritis after SAB. Afebrile, no leukocytosis. Admission not indicated Unasyn given in MAU. Rx Augmentin.   Discussed Hx, labs, exam w/ Dr. Kennon Rounds. Agrees w/ POC. May need D/C. Discussed w/ Dr. Charlesetta Garibaldi on-call DM for [facility]. Since bleeding is minimal she recommends pt get ABX on board first and Methergine and F/U at [facility] this week.    ASSESSMENT 1. Postpartum endometritis   2. History of miscarriage   3. Abdominal pain   4. Retained products of conception without hemorrhage     PLAN Discharge home in stable condition. Endometritis, retained POC precautions If bleeding increases return to MAU, remain NPO.  Follow-up Information    Thurnell Lose, MD Follow up.   Specialty: Obstetrics and Gynecology Why: Call office Monday (11/14/19) to see when Dr. Simona Huh wants to see you.  Contact information: [address] [address] [phone]        [facility] Follow up.   Specialty: Obstetrics and Gynecology Why: As needed for fever greater than 100.4, heavy bleeding, worsening pain. If you have heavy bleeding do not have anything to eat or drink. You may need a D&C.  Contact information: [address] [id number] [place] [phone]         Allergies as of 11/13/2019   No Known Allergies     Medication List    STOP taking these medications   ibuprofen 600 MG tablet Commonly known as: ADVIL     TAKE these medications   acetaminophen 500 MG tablet Commonly known as: TYLENOL Take 1,000 mg by mouth every 6 (six) hours as needed for headache.   amoxicillin-clavulanate 875-125 MG tablet Commonly known as: AUGMENTIN Take 1 tablet by mouth every 12 (twelve) hours for 10 days.   ketorolac 10 MG tablet Commonly known as: TORADOL Take 1 tablet (10 mg total) by mouth every 6 (six) hours as needed.   methylergonovine 0.2 MG tablet Commonly known as: METHERGINE Take 1 tablet (0.2 mg total) by mouth 4 (four) times daily.   prenatal multivitamin Tabs tablet Take 1 tablet by mouth at bedtime.        Tamala Julian, [place], [place] 11/13/2019  6:29 PM

## 2019-11-13 NOTE — MAU Note (Signed)
Kirsten Long is a 34 y.o. here in MAU reporting: states she had a miscarriage last Friday. On Tuesday started abdominal pain, took ibuprofen and it did help but she didn't want to take it to much. States the pain is still there. Pain is mostly on the left side of her abdomen and she says that pain is bearable but then the pain shoots across her abdomen and that is severe. Also has had a headache since Tuesday. Also feeling tingling in both of her calves since Friday.  Onset of complaint: Tuesday  Pain score: 3/10 constant abdominal pain, shooting pain 10/10  Vitals:   11/13/19 1530  BP: 128/81  Pulse: 95  Resp: 16  Temp: 99.9 F (37.7 C)  SpO2: 99%     Lab orders placed from triage: UA

## 2019-11-13 NOTE — Discharge Instructions (Signed)
Endometritis  Endometritis is irritation, soreness, or inflammation that affects the lining of the uterus (endometrium). Infection is usually the cause of endometritis. It is important to get treatment to prevent complications. Common complications may include more severe infections and not being able to have children(infertility). What are the causes? This condition may be caused by:  Bacterial infections.  STIs (sexually transmitted infections).  A miscarriage or childbirth, especially after a long labor or cesarean delivery.  Certain gynecological procedures. These may include dilation and curettage (D&C), hysteroscopy, or birth control (contraceptive) insertion.  Tuberculosis (TB). What are the signs or symptoms? Symptoms of this condition include:  Fever.  Lower abdomen (abdominal) pain.  Pelvis (pelvic) pain.  Abnormal vaginal discharge or bleeding.  Abdominal bloating (distention) or swelling.  General discomfort or generally feeling ill.  Discomfort with bowel movements.  Constipation. How is this diagnosed? This condition may be diagnosed based on:  A physical exam, including a pelvic exam.  Tests, such as: ? Blood tests. ? Removal of a sample of endometrial tissue for testing (endometrial biopsy). ? Examining a sample of vaginal discharge under a microscope (wet prep). ? Removal of a sample of fluid from the cervix for testing (cervical culture). ? Surgical examination of the pelvis and abdomen. How is this treated? This condition is treated with:  Antibiotic medicines.  For more severe cases, hospitalization may be needed to give fluids and antibiotics directly into a vein through an IV tube. Follow these instructions at home:  Take over-the-counter and prescription medicines only as told by your health care provider.  Drink enough fluid to keep your urine clear or pale yellow.  Take your antibiotic medicine as told by your health care provider. Do  not stop taking the antibiotic even if you start to feel better.  Do not douche or have sex (including vaginal, oral, and anal sex) until your health care provider approves.  If your endometritis was caused by an STI, do not have sex (including vaginal, oral, and anal sex) until your partner has also been treated for the STI.  Return to your normal activities as told by your health care provider. Ask your health care provider what activities are safe for you.  Keep all follow-up visits as told by your health care provider. This is important. Contact a health care provider if:  You have pain that does not get better with medicine.  You have a fever.  You have pain with bowel movements. Get help right away if:  You have abdominal swelling.  You have abdominal pain that gets worse.  You have bad-smelling vaginal discharge, or an increased amount of vaginal discharge.  You have abnormal vaginal bleeding.  You have nausea and vomiting. Summary  Endometritis affects the lining of the uterus (endometrium) and is usually caused by an infection.  It is important to get treatment to prevent complications.  You have several treatment options for endometritis. Treatment may include antibiotics and IV fluids.  Take your antibiotic medicine as told by your health care provider. Do not stop taking the antibiotic even if you start to feel better.  Do not douche or have sex (including vaginal, oral, and anal sex) until your health care provider approves. This information is not intended to replace advice given to you by your health care provider. Make sure you discuss any questions you have with your health care provider. Document Revised: 09/18/2017 Document Reviewed: 10/21/2016 Elsevier Patient Education  2020 [employer]vier Inc.  

## 2019-11-14 LAB — GC/CHLAMYDIA PROBE AMP (~~LOC~~) NOT AT ARMC
Chlamydia: NEGATIVE
Comment: NEGATIVE
Comment: NORMAL
Neisseria Gonorrhea: NEGATIVE

## 2019-11-14 LAB — URINE CULTURE: Culture: NO GROWTH

## 2019-11-16 ENCOUNTER — Ambulatory Visit (HOSPITAL_BASED_OUTPATIENT_CLINIC_OR_DEPARTMENT_OTHER): Payer: BC Managed Care – PPO | Admitting: Certified Registered Nurse Anesthetist

## 2019-11-16 ENCOUNTER — Other Ambulatory Visit: Payer: Self-pay

## 2019-11-16 ENCOUNTER — Other Ambulatory Visit: Payer: Self-pay | Admitting: Obstetrics and Gynecology

## 2019-11-16 ENCOUNTER — Encounter (HOSPITAL_BASED_OUTPATIENT_CLINIC_OR_DEPARTMENT_OTHER)
Admission: RE | Disposition: A | Discharge status: Home / Self Care | Payer: Self-pay | Source: Ambulatory Visit | Attending: Obstetrics and Gynecology

## 2019-11-16 ENCOUNTER — Other Ambulatory Visit (HOSPITAL_COMMUNITY): Payer: Self-pay | Admitting: Obstetrics and Gynecology

## 2019-11-16 ENCOUNTER — Ambulatory Visit (HOSPITAL_BASED_OUTPATIENT_CLINIC_OR_DEPARTMENT_OTHER)
Admission: RE | Admit: 2019-11-16 | Discharge: 2019-11-16 | Disposition: A | Discharge status: Home / Self Care | Payer: BC Managed Care – PPO | Source: Ambulatory Visit | Attending: Obstetrics and Gynecology | Admitting: Obstetrics and Gynecology

## 2019-11-16 ENCOUNTER — Encounter (HOSPITAL_BASED_OUTPATIENT_CLINIC_OR_DEPARTMENT_OTHER): Payer: Self-pay | Admitting: Obstetrics and Gynecology

## 2019-11-16 ENCOUNTER — Ambulatory Visit (HOSPITAL_COMMUNITY)
Admission: RE | Admit: 2019-11-16 | Discharge: 2019-11-16 | Disposition: A | Discharge status: Home / Self Care | Payer: BC Managed Care – PPO | Source: Ambulatory Visit | Attending: Obstetrics and Gynecology | Admitting: Obstetrics and Gynecology

## 2019-11-16 ENCOUNTER — Other Ambulatory Visit (HOSPITAL_COMMUNITY)
Admission: RE | Admit: 2019-11-16 | Discharge: 2019-11-16 | Disposition: A | Discharge status: Home / Self Care | Payer: BC Managed Care – PPO | Source: Ambulatory Visit | Attending: Obstetrics and Gynecology | Admitting: Obstetrics and Gynecology

## 2019-11-16 DIAGNOSIS — Z9049 Acquired absence of other specified parts of digestive tract: Secondary | ICD-10-CM | POA: Insufficient documentation

## 2019-11-16 DIAGNOSIS — O23592 Infection of other part of genital tract in pregnancy, second trimester: Secondary | ICD-10-CM | POA: Diagnosis not present

## 2019-11-16 DIAGNOSIS — Z87891 Personal history of nicotine dependence: Secondary | ICD-10-CM | POA: Diagnosis not present

## 2019-11-16 DIAGNOSIS — Z7982 Long term (current) use of aspirin: Secondary | ICD-10-CM | POA: Insufficient documentation

## 2019-11-16 DIAGNOSIS — Z79899 Other long term (current) drug therapy: Secondary | ICD-10-CM | POA: Insufficient documentation

## 2019-11-16 DIAGNOSIS — M459 Ankylosing spondylitis of unspecified sites in spine: Secondary | ICD-10-CM | POA: Insufficient documentation

## 2019-11-16 DIAGNOSIS — O021 Missed abortion: Secondary | ICD-10-CM | POA: Insufficient documentation

## 2019-11-16 DIAGNOSIS — Z20822 Contact with and (suspected) exposure to covid-19: Secondary | ICD-10-CM | POA: Insufficient documentation

## 2019-11-16 DIAGNOSIS — Z791 Long term (current) use of non-steroidal anti-inflammatories (NSAID): Secondary | ICD-10-CM | POA: Insufficient documentation

## 2019-11-16 DIAGNOSIS — Z803 Family history of malignant neoplasm of breast: Secondary | ICD-10-CM | POA: Insufficient documentation

## 2019-11-16 DIAGNOSIS — Z8249 Family history of ischemic heart disease and other diseases of the circulatory system: Secondary | ICD-10-CM | POA: Diagnosis not present

## 2019-11-16 DIAGNOSIS — Z3A17 17 weeks gestation of pregnancy: Secondary | ICD-10-CM | POA: Diagnosis not present

## 2019-11-16 HISTORY — PX: DILATION AND EVACUATION: SHX1459

## 2019-11-16 HISTORY — PX: OPERATIVE ULTRASOUND: SHX5996

## 2019-11-16 LAB — CBC WITH DIFFERENTIAL/PLATELET
Abs Immature Granulocytes: 0.04 10*3/uL (ref 0.00–0.07)
Basophils Absolute: 0 10*3/uL (ref 0.0–0.1)
Basophils Relative: 0 %
Eosinophils Absolute: 0.1 10*3/uL (ref 0.0–0.5)
Eosinophils Relative: 1 %
HCT: 30.1 % — ABNORMAL LOW (ref 36.0–46.0)
Hemoglobin: 9.7 g/dL — ABNORMAL LOW (ref 12.0–15.0)
Immature Granulocytes: 0 %
Lymphocytes Relative: 19 %
Lymphs Abs: 1.9 10*3/uL (ref 0.7–4.0)
MCH: 29.8 pg (ref 26.0–34.0)
MCHC: 32.2 g/dL (ref 30.0–36.0)
MCV: 92.3 fL (ref 80.0–100.0)
Monocytes Absolute: 0.4 10*3/uL (ref 0.1–1.0)
Monocytes Relative: 4 %
Neutro Abs: 7.5 10*3/uL (ref 1.7–7.7)
Neutrophils Relative %: 76 %
Platelets: 688 10*3/uL — ABNORMAL HIGH (ref 150–400)
RBC: 3.26 MIL/uL — ABNORMAL LOW (ref 3.87–5.11)
RDW: 13.9 % (ref 11.5–15.5)
WBC: 10 10*3/uL (ref 4.0–10.5)
nRBC: 0 % (ref 0.0–0.2)

## 2019-11-16 LAB — COMPREHENSIVE METABOLIC PANEL
ALT: 14 U/L (ref 0–44)
AST: 16 U/L (ref 15–41)
Albumin: 3.7 g/dL (ref 3.5–5.0)
Alkaline Phosphatase: 77 U/L (ref 38–126)
Anion gap: 10 (ref 5–15)
BUN: 12 mg/dL (ref 6–20)
CO2: 26 mmol/L (ref 22–32)
Calcium: 9.1 mg/dL (ref 8.9–10.3)
Chloride: 105 mmol/L (ref 98–111)
Creatinine, Ser: 0.77 mg/dL (ref 0.44–1.00)
GFR calc Af Amer: >60 mL/min (ref >60)
GFR calc non Af Amer: >60 mL/min (ref >60)
Glucose, Bld: 90 mg/dL (ref 70–99)
Potassium: 3.6 mmol/L (ref 3.5–5.1)
Sodium: 141 mmol/L (ref 135–145)
Total Bilirubin: 0.4 mg/dL (ref 0.3–1.2)
Total Protein: 8.1 g/dL (ref 6.5–8.1)

## 2019-11-16 LAB — RESPIRATORY PANEL BY RT PCR (FLU A&B, COVID)
Influenza A by PCR: NEGATIVE
Influenza B by PCR: NEGATIVE
SARS Coronavirus 2 by RT PCR: NEGATIVE

## 2019-11-16 SURGERY — DILATION AND EVACUATION, UTERUS
Anesthesia: General | Site: Abdomen

## 2019-11-16 MED ORDER — MISOPROSTOL 200 MCG PO TABS
1000.0000 ug | ORAL_TABLET | Freq: Once | ORAL | Status: DC | PRN
  Filled 2019-11-16: qty 5

## 2019-11-16 MED ORDER — LIDOCAINE 2% (20 MG/ML) 5 ML SYRINGE
INTRAMUSCULAR | Status: AC
  Filled 2019-11-16: qty 5

## 2019-11-16 MED ORDER — LIDOCAINE-EPINEPHRINE 1 %-1:100000 IJ SOLN
INTRAMUSCULAR | Status: DC | PRN
  Administered 2019-11-16: 10 mL

## 2019-11-16 MED ORDER — HYDROMORPHONE HCL 1 MG/ML IJ SOLN
0.2500 mg | INTRAMUSCULAR | Status: DC | PRN
  Filled 2019-11-16: qty 0.5

## 2019-11-16 MED ORDER — LIDOCAINE 2% (20 MG/ML) 5 ML SYRINGE
INTRAMUSCULAR | Status: DC | PRN
  Administered 2019-11-16: 20 mg via INTRAVENOUS

## 2019-11-16 MED ORDER — MIDAZOLAM HCL 5 MG/5ML IJ SOLN
INTRAMUSCULAR | Status: DC | PRN
  Administered 2019-11-16: 2 mg via INTRAVENOUS

## 2019-11-16 MED ORDER — LACTATED RINGERS IV SOLN
INTRAVENOUS | Status: DC
  Filled 2019-11-16: qty 1000

## 2019-11-16 MED ORDER — ONDANSETRON HCL 4 MG/2ML IJ SOLN
INTRAMUSCULAR | Status: DC | PRN
  Administered 2019-11-16: 4 mg via INTRAVENOUS

## 2019-11-16 MED ORDER — AMPICILLIN-SULBACTAM SODIUM 3 (2-1) G IJ SOLR
INTRAMUSCULAR | Status: AC
  Filled 2019-11-16: qty 8

## 2019-11-16 MED ORDER — SODIUM CHLORIDE 0.9 % IV SOLN
3.0000 g | INTRAVENOUS | Status: AC
  Administered 2019-11-16: 3 g via INTRAVENOUS
  Filled 2019-11-16: qty 8

## 2019-11-16 MED ORDER — MIDAZOLAM HCL 2 MG/2ML IJ SOLN
INTRAMUSCULAR | Status: AC
  Filled 2019-11-16: qty 2

## 2019-11-16 MED ORDER — MISOPROSTOL 200 MCG PO TABS: 200.0000 ug | ORAL_TABLET | Freq: Four times a day (QID) | ORAL | 0 refills | Status: DC

## 2019-11-16 MED ORDER — DEXAMETHASONE SODIUM PHOSPHATE 10 MG/ML IJ SOLN
INTRAMUSCULAR | Status: AC
  Filled 2019-11-16: qty 1

## 2019-11-16 MED ORDER — MEPERIDINE HCL 25 MG/ML IJ SOLN
INTRAMUSCULAR | Status: AC
  Filled 2019-11-16: qty 1

## 2019-11-16 MED ORDER — DEXAMETHASONE SODIUM PHOSPHATE 10 MG/ML IJ SOLN
INTRAMUSCULAR | Status: DC | PRN
  Administered 2019-11-16: 8 mg via INTRAVENOUS

## 2019-11-16 MED ORDER — PROPOFOL 10 MG/ML IV BOLUS
INTRAVENOUS | Status: DC | PRN
  Administered 2019-11-16: 180 mg via INTRAVENOUS

## 2019-11-16 MED ORDER — PROPOFOL 10 MG/ML IV BOLUS
INTRAVENOUS | Status: AC
  Filled 2019-11-16: qty 20

## 2019-11-16 MED ORDER — MEPERIDINE HCL 25 MG/ML IJ SOLN
6.2500 mg | INTRAMUSCULAR | Status: DC | PRN
  Administered 2019-11-16: 6.25 mg via INTRAVENOUS
  Filled 2019-11-16: qty 1

## 2019-11-16 MED ORDER — FENTANYL CITRATE (PF) 100 MCG/2ML IJ SOLN
INTRAMUSCULAR | Status: DC | PRN
  Administered 2019-11-16: 50 ug via INTRAVENOUS
  Administered 2019-11-16 (×2): 25 ug via INTRAVENOUS

## 2019-11-16 MED ORDER — ONDANSETRON HCL 4 MG/2ML IJ SOLN
INTRAMUSCULAR | Status: AC
  Filled 2019-11-16: qty 2

## 2019-11-16 MED ORDER — LACTATED RINGERS IV SOLN
INTRAVENOUS | Status: DC
  Administered 2019-11-16: 12:00:00 125 mL/h via INTRAVENOUS
  Filled 2019-11-16: qty 1000

## 2019-11-16 MED ORDER — FENTANYL CITRATE (PF) 100 MCG/2ML IJ SOLN
INTRAMUSCULAR | Status: AC
  Filled 2019-11-16: qty 2

## 2019-11-16 MED ORDER — SODIUM CHLORIDE 0.9 % IV SOLN
INTRAVENOUS | Status: AC
  Filled 2019-11-16: qty 100

## 2019-11-16 MED ORDER — PROMETHAZINE HCL 25 MG/ML IJ SOLN
6.2500 mg | INTRAMUSCULAR | Status: DC | PRN
  Filled 2019-11-16: qty 1

## 2019-11-16 MED ORDER — MISOPROSTOL 100 MCG PO TABS
1000.0000 ug | ORAL_TABLET | Freq: Once | ORAL | Status: AC
  Administered 2019-11-16: 1000 ug via RECTAL
  Filled 2019-11-16: qty 10

## 2019-11-16 MED ORDER — OXYCODONE HCL 5 MG/5ML PO SOLN
5.0000 mg | Freq: Once | ORAL | Status: DC | PRN
  Filled 2019-11-16: qty 5

## 2019-11-16 MED ORDER — OXYCODONE HCL 5 MG PO TABS
5.0000 mg | ORAL_TABLET | Freq: Once | ORAL | Status: DC | PRN
  Filled 2019-11-16: qty 1

## 2019-11-16 MED ORDER — MISOPROSTOL 200 MCG PO TABS
1000.0000 ug | ORAL_TABLET | Freq: Once | ORAL | Status: DC
  Filled 2019-11-16 (×2): qty 5

## 2019-11-16 SURGICAL SUPPLY — 23 items
CATH ROBINSON RED A/P 16FR (CATHETERS) ×4 IMPLANT
DECANTER SPIKE VIAL GLASS SM (MISCELLANEOUS) ×4 IMPLANT
FILTER UTR ASPR ASSEMBLY (MISCELLANEOUS) ×4 IMPLANT
GLOVE BIO SURGEON STRL SZ7 (GLOVE) ×4 IMPLANT
GLOVE BIOGEL PI IND STRL 7.0 (GLOVE) ×6 IMPLANT
GLOVE BIOGEL PI INDICATOR 7.0 (GLOVE) ×2
GOWN STRL REUS W/ TWL LRG LVL3 (GOWN DISPOSABLE) ×6 IMPLANT
GOWN STRL REUS W/TWL LRG LVL3 (GOWN DISPOSABLE) ×2
HOSE CONNECTING 18IN BERKELEY (TUBING) ×4 IMPLANT
KIT BERKELEY 1ST TRI 3/8 NO TR (MISCELLANEOUS) ×4 IMPLANT
KIT BERKELEY 1ST TRIMESTER 3/8 (MISCELLANEOUS) ×8 IMPLANT
NS IRRIG 1000ML POUR BTL (IV SOLUTION) ×2 IMPLANT
NS IRRIG 500ML POUR BTL (IV SOLUTION) ×2 IMPLANT
PACK VAGINAL MINOR WOMEN LF (CUSTOM PROCEDURE TRAY) ×4 IMPLANT
PAD OB MATERNITY 4.3X12.25 (PERSONAL CARE ITEMS) ×4 IMPLANT
SET BERKELEY SUCTION TUBING (SUCTIONS) ×4 IMPLANT
TOWEL OR 17X26 10 PK STRL BLUE (TOWEL DISPOSABLE) ×6 IMPLANT
UNDERPAD 30X36 HEAVY ABSORB (UNDERPADS AND DIAPERS) ×4 IMPLANT
VACURETTE 10 RIGID CVD (CANNULA) IMPLANT
VACURETTE 12 RIGID CVD (CANNULA) ×2 IMPLANT
VACURETTE 7MM CVD STRL WRAP (CANNULA) IMPLANT
VACURETTE 8 RIGID CVD (CANNULA) IMPLANT
VACURETTE 9 RIGID CVD (CANNULA) IMPLANT

## 2019-11-16 NOTE — H&P (Signed)
History of Present Illness  General:  34 y/o female presents for f/u on miscarriage. She went to the hospital 01/24 due to severe abdominal pain and was informed she had an infection because she did not pass placenta after miscarriage. She is taking Methergine once a day. She reports bad LLQ pain and pain will have a shooting sensation on occasions. Mentions pain worsens when she coughes, sneezes, or applies pressure to LLQ. She denies fevers at this time, mentions when she went to the hospital she was told she had a low grade fever.  Not opposed to [facility] today. Pt mentions she has been crying sporadically and has not ate a full meal over the past week, she has been eating snacks and small portions. Pregnancy was not planned. She agrees to grief counseling. She last ate and took Methergine at 10:30 pm 01/26.  Denies allergy. Isolation Precautions:  Respiratory Illness Screening  1. Is fever present / reported? No 2. Are respiratory illness symptom(s) present / reported? No 3. Are other symptom(s) present / reported? No 5. Has there been reported travel to a High Risk respiratory illness region? Unknown 6. Has close* contact with person(s) known to have communicable illness been reported? No Has patient been tested for COVID-19? No.    Current Medications  Taking   Prenatal Vitamin 27-0.8 MG Tablet 1 tablet Orally Once a day   Tramadol HCl 50 MG Tablet 1 tablet as needed Orally Once a day   Amoxicillin 500 MG Capsule 1 capsule Orally every 12 hrs   Not-Taking   Mirena 20 MCG/24HR Intrauterine Device Intrauterine   Valtrex(valACYclovir HCl) 500 MG Tablet 1 tablet Orally every 12 hrs x 3 days prn   Excedrin Migraine(Aspirin-Acetaminophen-Caffeine) 250-250-65 MG Tablet 2 tablets as needed Orally every 6 hrs   Aleve(Naproxen) 220 MG Tablet 1 tablet as needed Orally every 12 hrs   Humira Pen(Adalimumab) 40 MG/0.8ML Pen-injector Kit Subcutaneous   Medication List reviewed and reconciled with  the patient    Past Medical History  Panic attack (07/2013).   Iritis (2014), HLA B27 Pos.   Ankylosing spondylitis.   Specialists: Rheum-Deveshwar, GYN-Randale Carvalho.    Surgical History  Laparoscopic cholecystectomy 09/2012   Family History  Father: alive, diagnosed with Hypertension  Mother: alive, Hypertension  Maternal Grand Mother: deceased, breast cancer, Breast cancer  3 son(s) .   MGM with breast cancer.   Social History  General:  Tobacco use  cigarettes: Former smoker Quit in year 10/2010 Tobacco history last updated 11/16/2019 no EXPOSURE TO PASSIVE SMOKE.  Alcohol: occasionally-stopped once found out about pregnancy.  no Recreational drug use.  no Exercise.  Marital Status: married.  Children: 3 sons.    Gyn History  Sexual activity currently sexually active.  Periods : every month.  LMP 07/06/2019.  Denies H/O Birth control.  Last pap smear date 02/09/2018-Neg/HPV neg.  Denies H/O Last mammogram date.  Denies H/O Abnormal pap smear.  STD Herpes.    OB History  Number of pregnancies 5.  Pregnancy # 1 live birth, vaginal delivery.  Pregnancy # 2 live birth, vaginal delivery.  Pregnancy # 3 miscarriage.  Pregnancy # 4: live birth, vaginal delivery.  Pregnancy # 5: Miscarriage.    Allergies  N.K.D.A.   Hospitalization/Major Diagnostic Procedure  childbirth x 3   None this past yr 01/2018   Review of Systems  See scanned ROS form for details. Denies fever/chills, chest pain, SOB, headaches, numbness/tingling. No h/o complication with anesthesia, bleeding disorders or blood  clots See HPI.   Vital Signs  Wt 168.3, Wt change -1.9 lb, Ht 65, BMI 28.00, Temp 97.0, Pulse sitting 97, BP sitting 116/90.   Physical Examination  GENERAL:  Patient appears alert and oriented , alert and oriented.  General Appearance: well-appearing, well-developed, no acute distress , well-appearing, well-developed, no acute distress.  Speech: clear , clear.  LUNGS:   Auscultation: no wheezing/rhonchi/rales. CTA bilaterally.  Effort: no respiratory distress, comfortable breathing.  HEART:  Heart sounds: normal. RRR. no murmur.  ABDOMEN:  General: soft nontender, nondistended, no masses tenderness or organomegaly, non distended, Moderate lower abdominal pain, no rebound .  FEMALE GENITOURINARY:  Cervix visualized, healthy appearing, no discharge, no lesions, Scant bleeding from cervix, no discharge.  Adnexa: no mass, non tender.  Uterus: normal size/shape/consistency, freely mobile, non tender, Moderate uterine tenderness.  Vagina: pink/moist mucosa, no lesions, no abnormal discharge.  Vulva: normal, no lesions, no skin discoloration.  Anus: no external hemosrhoids.  EXTREMITIES:  general no edema.  General: No edema or calf tenderness.  Pt aware of scribe services today.   Assessments     1. Incomplete miscarriage - O03.4 (Primary)   2. Retained products of conception after delivery with complications - X21.1   3. Endometritis - N71.9   4. Encounter for other preprocedural examination - Z01.818   Treatment  1. Incomplete miscarriage  Notes: Recommended D&C to surgically remove remnants and tissue remaining from miscarriage. Pt counseled on R/B/A of D&C, including but not limited to infection, bleeding and injury to organs in the abdomen. Discussed recovery time, pt advised this is a minor surgery and she should be able to return to work in a week. No additional pre-operative clearance needed at this time, no concerning medical Dx that may complicate the procedure. Recommended grief counseling. Surgery scheduled for 12:30 at hospital. .    2. Retained products of conception after delivery with complications  Notes: Recommended D&C to surgically remove remnants and tissue remaining from miscarriage. Pt counseled on R/B/A of D&C, including but not limited to infection, bleeding and injury to organs in the abdomen. Discussed recovery time, pt  advised this is a minor surgery and she should be able to return to work in a week. No additional pre-operative clearance needed at this time, no concerning medical Dx that may complicate the procedure. Surgery scheduled for 12:30 at hospital.    3. Endometritis  Notes: Limited abdominal ultrasound showed thickened endometrium with a small amount of FF. She is taking Methergine. Surgery scheduled for 12:30 at hospital. .    4. Encounter for other preprocedural examination  Notes: Pt counseled on R/B/A of procedure, including but not limited to infection, bleeding and injury to organs in the abdomen.    Procedures  Scribe Documentation:  Attestation: I personally scribed for Dr. Simona Huh on the date of this appointment. Electronically signed by scribe , Anastasio Auerbach 11/16/2019 09:16:58 AM > .       Procedure Codes  94174 POSTOP FOLLOW-UP VISIT   Follow Up  2 Weeks (Reason: postop)  Care Plan Details

## 2019-11-16 NOTE — Anesthesia Procedure Notes (Signed)
Procedure Name: LMA Insertion Date/Time: 11/16/2019 1:05 PM Performed by: Eben Burow, CRNA Pre-anesthesia Checklist: Patient identified, Emergency Drugs available, Suction available, Patient being monitored and Timeout performed Patient Re-evaluated:Patient Re-evaluated prior to induction Oxygen Delivery Method: Circle system utilized Preoxygenation: Pre-oxygenation with 100% oxygen Induction Type: IV induction Ventilation: Mask ventilation without difficulty LMA: LMA inserted LMA Size: 4.0 Number of attempts: 1 Tube secured with: Tape Dental Injury: Teeth and Oropharynx as per pre-operative assessment

## 2019-11-16 NOTE — Transfer of Care (Signed)
Immediate Anesthesia Transfer of Care Note  Patient: Fransisca Kaufmann Worthy-Nesmith  Procedure(s) Performed: DILATATION AND EVACUATION (N/A Vagina ) OPERATIVE ULTRASOUND (N/A Abdomen)  Patient Location: PACU  Anesthesia Type:General  Level of Consciousness: awake, alert  and oriented  Airway & Oxygen Therapy: Patient Spontanous Breathing and Patient connected to face mask oxygen  Post-op Assessment: Report given to RN and Post -op Vital signs reviewed and stable  Post vital signs: Reviewed and stable  Last Vitals:  Vitals Value Taken Time  BP 140/84 11/16/19 1342  Temp    Pulse 110 11/16/19 1343  Resp 26 11/16/19 1343  SpO2 100 % 11/16/19 1343  Vitals shown include unvalidated device data.  Last Pain:  Vitals:   11/16/19 1225  TempSrc: Oral  PainSc: 2       Patients Stated Pain Goal: 5 ([id number])  Complications: No apparent anesthesia complications

## 2019-11-16 NOTE — Brief Op Note (Signed)
11/16/2019  1:47 PM  PATIENT:  Kirsten Long  34 y.o. female  PRE-OPERATIVE DIAGNOSIS:  Retained products of conception, S/p SAB at 17 weeks, Endometritis  POST-OPERATIVE DIAGNOSIS:  Same  PROCEDURE:  Procedure(s): DILATATION AND EVACUATION (N/A) OPERATIVE ULTRASOUND (N/A) Suction D&C   SURGEON:  Surgeon(s) and Role:    Thurnell Lose, MD - Primary  PHYSICIAN ASSISTANT:   ASSISTANTS: none   ANESTHESIA:   local and general  EBL:  20 mL   BLOOD ADMINISTERED:none  DRAINS: none   LOCAL MEDICATIONS USED:  1 % LIDOCAINE with epihephrine , Amount: 10 ml  SPECIMEN:  Source of Specimen:  Retained POCs  DISPOSITION OF SPECIMEN:  PATHOLOGY  COUNTS:  YES  TOURNIQUET:  * No tourniquets in log *  DICTATION: .Other Dictation: Dictation Number [id number]  PLAN OF CARE: Discharge to home after PACU  PATIENT DISPOSITION:  PACU - hemodynamically stable.   Delay start of Pharmacological VTE agent (>24hrs) due to surgical blood loss or risk of bleeding: not applicable

## 2019-11-16 NOTE — Anesthesia Preprocedure Evaluation (Signed)
Anesthesia Evaluation  Patient identified by MRN, date of birth, ID band Patient awake    Reviewed: Allergy & Precautions, H&P , NPO status , Patient's Chart, lab work & pertinent test results  Airway Mallampati: II  TM Distance: >3 FB Neck ROM: Full    Dental no notable dental hx.    Pulmonary neg pulmonary ROS, former smoker,    Pulmonary exam normal breath sounds clear to auscultation       Cardiovascular hypertension, negative cardio ROS Normal cardiovascular exam Rhythm:Regular Rate:Normal     Neuro/Psych  Headaches, negative psych ROS   GI/Hepatic negative GI ROS, Neg liver ROS,   Endo/Other  negative endocrine ROS  Renal/GU negative Renal ROS  negative genitourinary   Musculoskeletal negative musculoskeletal ROS (+)   Abdominal   Peds negative pediatric ROS (+)  Hematology negative hematology ROS (+)   Anesthesia Other Findings   Reproductive/Obstetrics negative OB ROS                             Anesthesia Physical  Anesthesia Plan  ASA: II  Anesthesia Plan: General   Post-op Pain Management:    Induction: Intravenous  PONV Risk Score and Plan: 3 and Ondansetron, Dexamethasone, Midazolam and Treatment may vary due to age or medical condition  Airway Management Planned: LMA  Additional Equipment:   Intra-op Plan:   Post-operative Plan: Extubation in OR  Informed Consent: I have reviewed the patients History and Physical, chart, labs and discussed the procedure including the risks, benefits and alternatives for the proposed anesthesia with the patient or authorized representative who has indicated his/her understanding and acceptance.     Dental advisory given  Plan Discussed with: CRNA  Anesthesia Plan Comments:         Anesthesia Quick Evaluation

## 2019-11-16 NOTE — Interval H&P Note (Signed)
History and Physical Interval Note:  11/16/2019 12:54 PM  Kirsten Long  has presented today for surgery, with the diagnosis of retained placenta after MB at 17 weeks.  The various methods of treatment have been discussed with the patient and family. After consideration of risks, benefits and other options for treatment, the patient has consented to  Procedure(s): DILATATION AND EVACUATION (N/A) OPERATIVE ULTRASOUND (N/A) as a surgical intervention.  The patient's history has been reviewed, patient examined, no change in status, stable for surgery.  I have reviewed the patient's chart and labs.  Questions were answered to the patient's satisfaction.     Thurnell Lose

## 2019-11-16 NOTE — Discharge Instructions (Addendum)
Post Anesthesia Home Care Instructions  Activity: Get plenty of rest for the remainder of the day. A responsible adult should stay with you for 24 hours following the procedure.  For the next 24 hours, DO NOT: -Drive a car -Paediatric nurse -Drink alcoholic beverages -Take any medication unless instructed by your physician -Make any legal decisions or sign important papers.  Meals: Start with liquid foods such as gelatin or soup. Progress to regular foods as tolerated. Avoid greasy, spicy, heavy foods. If nausea and/or vomiting occur, drink only clear liquids until the nausea and/or vomiting subsides. Call your physician if vomiting continues.  Special Instructions/Symptoms: Your throat may feel dry or sore from the anesthesia or the breathing tube placed in your throat during surgery. If this causes discomfort, gargle with warm salt water. The discomfort should disappear within 24 hours.  If you had a scopolamine patch placed behind your ear for the management of post- operative nausea and/or vomiting:  1. The medication in the patch is effective for 72 hours, after which it should be removed.  Wrap patch in a tissue and discard in the trash. Wash hands thoroughly with soap and water. 2. You may remove the patch earlier than 72 hours if you experience unpleasant side effects which may include dry mouth, dizziness or visual disturbances. 3. Avoid touching the patch. Wash your hands with soap and water after contact with the patch.   Dilation and Curettage or Vacuum Curettage, Care After This sheet gives you information about how to care for yourself after your procedure. Your health care provider may also give you more specific instructions. If you have problems or questions, contact your health care provider. What can I expect after the procedure? After your procedure, it is common to have:  Mild pain or cramping.  Some vaginal bleeding or spotting. These may last for up to 2 weeks  after your procedure. Follow these instructions at home: Activity   Do not drive or use heavy machinery while taking prescription pain medicine.  Avoid driving for the first 24 hours after your procedure.  Take frequent, short walks, followed by rest periods, throughout the day. Ask your health care provider what activities are safe for you. After 1-2 days, you may be able to return to your normal activities.  Do not lift anything heavier than 10 lb (4.5 kg) until your health care provider approves.  For at least 2 weeks, or as long as told by your health care provider, do not: ? Douche. ? Use tampons. ? Have sexual intercourse. General instructions   Take over-the-counter and prescription medicines only as told by your health care provider. This is especially important if you take blood thinning medicine.  Do not take baths, swim, or use a hot tub until your health care provider approves. Take showers instead of baths.  Wear compression stockings as told by your health care provider. These stockings help to prevent blood clots and reduce swelling in your legs.  It is your responsibility to get the results of your procedure. Ask your health care provider, or the department performing the procedure, when your results will be ready.  Keep all follow-up visits as told by your health care provider. This is important. Contact a health care provider if:  You have severe cramps that get worse or that do not get better with medicine.  You have severe abdominal pain.  You cannot drink fluids without vomiting.  You develop pain in a different area of your pelvis.  You have bad-smelling vaginal discharge.  You have a rash. Get help right away if:  You have vaginal bleeding that soaks more than one sanitary pad in 1 hour, for 2 hours in a row.  You pass large blood clots from your vagina.  You have a fever that is above 100.73F (38.0C).  Your abdomen feels very tender or  hard.  You have chest pain.  You have shortness of breath.  You cough up blood.  You feel dizzy or light-headed.  You faint.  You have pain in your neck or shoulder area. This information is not intended to replace advice given to you by your health care provider. Make sure you discuss any questions you have with your health care provider. Document Revised: 09/18/2017 Document Reviewed: 05/08/2016 Elsevier Patient Education  2020 [employer].

## 2019-11-16 NOTE — Op Note (Signed)
NAME: Kirsten Long, Kirsten Long MEDICAL RECORD [id number] ACCOUNT [id number] DATE OF BIRTH:19-Jul-1986 FACILITY: [facility] LOCATION: [facility] PHYSICIAN:Rhyse Skowron Al Decant, MD  OPERATIVE REPORT  DATE OF PROCEDURE:  11/16/2019  PREOPERATIVE DIAGNOSES:   1.  Retained products of conception, status post spontaneous abortion at 43 weeks. 2.  Endometritis.  POSTOPERATIVE DIAGNOSES:   1.  Retained products of conception, status post spontaneous abortion at 30 weeks. 2.  Endometritis.  PROCEDURE:  Suction dilation and curettage under ultrasound guidance.  SURGEON:  Thurnell Lose, MD  ASSISTANT:  None.  ANESTHESIA:  Local and general.  ESTIMATED BLOOD LOSS:  20 mL.  BLOOD ADMINISTERED:  None.  DRAINS:  None.  LOCAL:  1% lidocaine with epinephrine, about 10 mL.  SPECIMEN:  Retained products of conception.  DISPOSITION OF SPECIMEN:  To pathology.  PATIENT DISPOSITION:  To PACU, hemodynamically stable.  COMPLICATIONS:  None.  FINDINGS:  Cervix easily dilated to a 12 Hegar.  Uterus sounded normally, on ultrasound retained products confirmed, minimal to moderate amount of tissue returned.  Normal cervix and uterus.  DESCRIPTION OF PROCEDURE:  The patient was identified in the holding area.  She was then taken to the operating room with IV running.  She underwent general anesthesia without complication.  She was placed in the dorsal lithotomy position, prepped and  draped in a normal sterile fashion.  SCDs were placed on her legs and operating.  Timeout was performed.  She received Unasyn prior to the procedure.  A Graves speculum was inserted into the vagina.  The cervix was easily visualized.  Paracervical block was performed with a total of 10 mL of 1% lidocaine with epinephrine.  She was dilated up to a 12 Hegar dilator.  Her uterus was sounded to 11 cm.  A  12 mm suction curette was then advanced to the uterine fundus and the products of conception were removed with the  suction.  After there was no obvious return of products, I went in with a curet and curettage was performed and a gritty cry was noted in  all 4 quadrants.  The suction curette was then reinserted and products of conception were removed.  On ultrasound at the end of the procedure,  the endometrial stripe appeared normal and much better than prior to the procedure and there was no uterine  perforation at the time of the procedure.  All instrument, sponge and needle counts were correct x3.  The patient tolerated the procedure well.  She was taken to the recovery room in stable condition.  VN/NUANCE  D:11/16/2019 T:11/16/2019 JOB:[id number]

## 2019-11-17 LAB — SURGICAL PATHOLOGY

## 2019-11-17 NOTE — Anesthesia Postprocedure Evaluation (Signed)
Anesthesia Post Note  Patient: Kirsten Long  Procedure(s) Performed: DILATATION AND EVACUATION (N/A Vagina ) OPERATIVE ULTRASOUND (N/A Abdomen)     Patient location during evaluation: PACU Anesthesia Type: General Level of consciousness: awake and alert Pain management: pain level controlled Vital Signs Assessment: post-procedure vital signs reviewed and stable Respiratory status: spontaneous breathing, nonlabored ventilation and respiratory function stable Cardiovascular status: blood pressure returned to baseline and stable Postop Assessment: no apparent nausea or vomiting Anesthetic complications: no    Last Vitals:  Vitals:   11/16/19 1439 11/16/19 1513  BP:  (!) 132/94  Pulse: 89 88  Resp: 20 18  Temp: 36.9 C   SpO2: 95% 98%    Last Pain:  Vitals:   11/16/19 1513  TempSrc:   PainSc: 0-No pain                 Lynda Rainwater

## 2020-01-03 IMAGING — CT CT VENOGRAM HEAD
1 of 16 series · 4 of 33 positions shown · IV contrast (ISOVUE 370)
Comparison: None.

CLINICAL DATA: Headache and fever

EXAM:
CT VENOGRAM HEAD
TECHNIQUE: Following contrast administration, vena graphic images of the head
were obtained. Multiplanar reconstructions were provided.
CONTRAST:  100mL LH8Q8L-0YF IOPAMIDOL (LH8Q8L-0YF) INJECTION 76%

[Series 11: cta head neck thins · axial · 0.39mm/px · z∈[-270,-83]mm · 4 of 623 slices shown]
[im 125/623  soft-tissue]
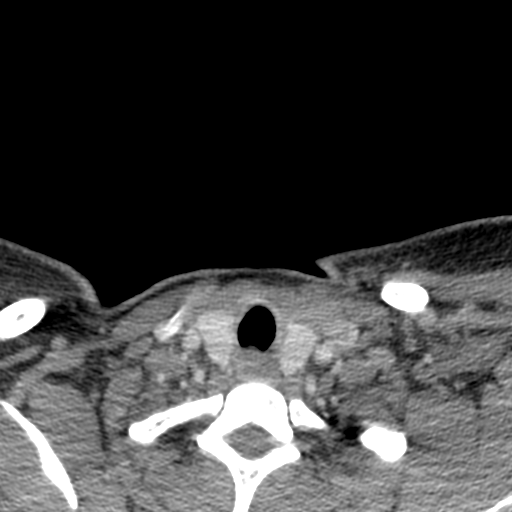
[im 249/623  bone]
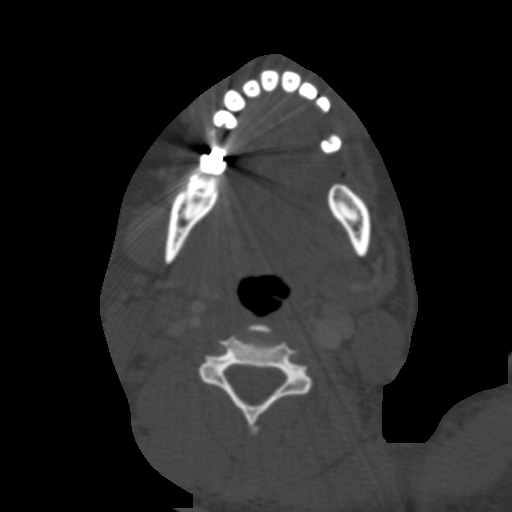
[im 374/623  soft-tissue]
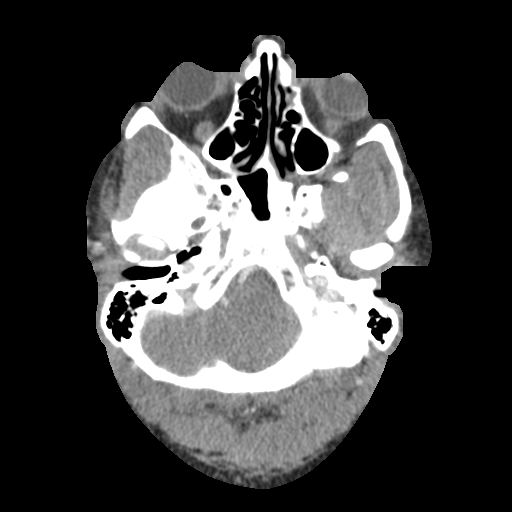
[im 498/623  bone]
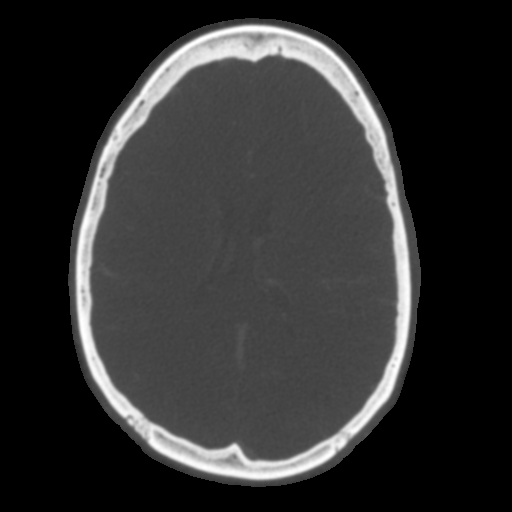

[4 of 33 positions shown; findings below may reference images not displayed]

FINDINGS: Superior sagittal sinus: Normal.

Straight sinus: Normal.

Inferior sagittal sinus, vein of Franko and internal cerebral veins:
Normal.

Transverse sinuses: Normal.

Sigmoid sinuses: Normal.

Visualized jugular veins: Normal.
IMPRESSION: No venous thrombosis.

## 2020-03-05 ENCOUNTER — Ambulatory Visit: Payer: BC Managed Care – PPO | Attending: Internal Medicine

## 2020-03-05 DIAGNOSIS — Z23 Encounter for immunization: Secondary | ICD-10-CM

## 2020-03-05 NOTE — Progress Notes (Signed)
   Covid-19 Vaccination Clinic  Name:  Laterria Goodie    MRN: [id number] DOB: [birth year]  03/05/2020  Ms. Worthy-Nesmith was observed post Covid-19 immunization for 15 minutes without incident. She was provided with Vaccine Information Sheet and instruction to access the V-Safe system.   Ms. Steenhoek was instructed to call 911 with any severe reactions post vaccine: Marland Kitchen Difficulty breathing  . Swelling of face and throat  . A fast heartbeat  . A bad rash all over body  . Dizziness and weakness   Immunizations Administered    Name Date Dose VIS Date Route   Pfizer COVID-19 Vaccine 03/05/2020  9:33 AM 0.3 mL 12/14/2018 Intramuscular   Manufacturer: [place]   Lot: [id number]   [place]: [id number]

## 2020-03-26 ENCOUNTER — Ambulatory Visit: Payer: BC Managed Care – PPO | Attending: Internal Medicine

## 2020-03-26 DIAGNOSIS — Z23 Encounter for immunization: Secondary | ICD-10-CM

## 2020-03-26 NOTE — Progress Notes (Signed)
   Covid-19 Vaccination Clinic  Name:  Kirsten Long    MRN: [id number] DOB: 11-15-1985  03/26/2020  Kirsten Long was observed post Covid-19 immunization for 15 minutes without incident. She was provided with Vaccine Information Sheet and instruction to access the V-Safe system.   Kirsten Long was instructed to call 911 with any severe reactions post vaccine: Marland Kitchen Difficulty breathing  . Swelling of face and throat  . A fast heartbeat  . A bad rash all over body  . Dizziness and weakness   Immunizations Administered    Name Date Dose VIS Date Route   Pfizer COVID-19 Vaccine 03/26/2020 10:45 AM 0.3 mL 12/14/2018 Intramuscular   Manufacturer: [employer]   Lot: [id number]   [place]: [id number]

## 2021-04-26 NOTE — Progress Notes (Signed)
Office Visit Note  Patient: Kirsten Long             Date of Birth: Jan 30, 1986           MRN: [id number]             PCP: Billie Ruddy, MD Referring: Katy Apo, MD Visit Date: 05/09/2021 Occupation: _0 @  Subjective:  Recurrent iritis.   History of Present Illness: Kirsten Long is a 35 y.o. female seen in  consultation per request of Dr. Prudencio Burly.  According the patient her symptoms a started in middle school when she was about 14.  She started having right SI joint pain and difficulty walking.  She was seen by her PCP and was told that she possibly had an injury.  She states she continued with some discomfort over time.  In 2015 she started having pain and swelling in her bilateral hands and lower back pain.  She also had 2 episodes of iritis.  She was evaluated by Dr. Tobie Lords who diagnosed her with ankylosing spondylitis.  She took NSAIDs initially.  She continues to have swelling in her hands and feet and difficulty walking.  She had injections by podiatrist and her feet.  She did not develop any symptoms of plantar fasciitis or Achilles tendinitis.  She went to the emergency room and had MRI of her lumbar spine which showed effusion and synovitis.  She went back to see Dr. Ouida Sills who gave her a prednisone Dosepak.  She was referred to me in September 2016.  At that time I reviewed MRI of her right hip joint which showed synovitis and effusion.  The synovial fluid analysis showed WBC count of 29,980 with 88% neutrophils.  At that time she was having pain and discomfort in her hands, right hip, knees and her feet.  She was also having symptoms of iritis and sacroiliitis.  We had detailed discussion regarding different treatment options and Humira was discussed during the office visit.  She was placed on Humira at the time.  She states she did well on Humira while she was on it but came off Humira in 2017 due to the cost of the medication.  She  was getting topical prednisone for iritis by [facility] ophthalmology.  At the office visit in 2018 we decided to place her back on Humira.  She did not come back for the follow-up visit.  She states her joints did pretty well over the years and she continues to have iritis.  She states in the last 3 years arthritis has become more frequent.  She has been under care of Dr. Prudencio Burly who referred her back for the treatment of ankylosing spondylitis.  The last episode of iritis was in March 2022 per patient.  She states she does not have much joint pain.  She denies any SI joint pain or discomfort in her hands or knees or her feet.  There is no family history of ankylosing spondylitis, psoriasis or psoriatic arthritis.  She is gravida 5, para 3 and miscarriage 2.  Not using any contraception.  Activities of Daily Living:  Patient reports morning stiffness for 0 minutes.   Patient Denies nocturnal pain.  Difficulty dressing/grooming: Denies Difficulty climbing stairs: Denies Difficulty getting out of chair: Denies Difficulty using hands for taps, buttons, cutlery, and/or writing: Denies  Review of Systems  Constitutional:  Negative for fatigue.  HENT:  Negative for mouth sores, mouth dryness and nose dryness.   Eyes:  Positive  for pain. Negative for itching and dryness.  Respiratory:  Negative for shortness of breath and difficulty breathing.   Cardiovascular:  Negative for chest pain and palpitations.  Gastrointestinal:  Negative for blood in stool, constipation and diarrhea.  Endocrine: Negative for increased urination.  Genitourinary:  Negative for difficulty urinating.  Musculoskeletal:  Negative for joint pain, joint pain, joint swelling, myalgias, morning stiffness, muscle tenderness and myalgias.  Skin:  Negative for color change, rash, redness and sensitivity to sunlight.  Allergic/Immunologic: Negative for susceptible to infections.  Neurological:  Positive for numbness. Negative for  dizziness, headaches, memory loss and weakness.  Hematological:  Negative for bruising/bleeding tendency and swollen glands.  Psychiatric/Behavioral:  Negative for depressed mood, confusion and sleep disturbance. The patient is not nervous/anxious.    PMFS History:  Patient Active Problem List   Diagnosis Date Noted   IUFD at less than 20 weeks of gestation 11/04/2019   H/O ankylosing spondylitis 10/07/2018   Headache 10/07/2018   Neck pain 10/07/2018   Ankylosing spondylitis of multiple sites in spine ([place]) 08/20/2017   Sacroiliitis ([place]) 08/20/2017   Iritis 08/20/2017   History of panic attacks 08/20/2017   Essential hypertension 08/20/2017   History of cholecystectomy 08/20/2017   Ex-smoker 08/20/2017    Past Medical History:  Diagnosis Date   Arthritis    HSV (herpes simplex virus) infection    Hypertension    Infection    Valtrex for HSV 2009    Urinary tract infection     Family History  Problem Relation Age of Onset   Hypertension Mother    Hypertension Father    Cancer Father    Healthy Brother    Healthy Brother    Hypertension Maternal Aunt    Hypertension Maternal Uncle    Cancer Maternal Grandmother    Hypertension Maternal Grandmother    Hypertension Maternal Grandfather    Hypertension Paternal Grandmother    Stroke Paternal Grandmother    Hypertension Paternal Grandfather    Stroke Paternal Grandfather    Past Surgical History:  Procedure Laterality Date   CHOLECYSTECTOMY  10/15/2012   Procedure: LAPAROSCOPIC CHOLECYSTECTOMY WITH INTRAOPERATIVE CHOLANGIOGRAM;  Surgeon: Shann Medal, MD;  Location: [facility];  Service: General;  Laterality: N/A;   DILATION AND EVACUATION N/A 11/16/2019   Procedure: DILATATION AND EVACUATION;  Surgeon: Thurnell Lose, MD;  Location: [facility];  Service: Gynecology;  Laterality: N/A;   OPERATIVE ULTRASOUND N/A 11/16/2019   Procedure: OPERATIVE ULTRASOUND;  Surgeon: Thurnell Lose, MD;  Location: [facility];  Service: Gynecology;  Laterality: N/A;   Social History   Social History Narrative   Not on file   Immunization History  Administered Date(s) Administered   PFIZER(Purple Top)SARS-COV-2 Vaccination 03/05/2020, 03/26/2020   Tdap 09/23/2012     Objective: Vital Signs: BP (!) 164/112 (BP Location: Right Arm, Patient Position: Sitting, Cuff Size: Normal)   Pulse 90   Ht _0  (1.626 m)   Wt 176 lb 3.2 oz (79.9 kg)   BMI 30.24 kg/m    Physical Exam Vitals and nursing note reviewed.  Constitutional:      Appearance: She is well-developed.  HENT:     Head: Normocephalic and atraumatic.  Eyes:     Conjunctiva/sclera: Conjunctivae normal.  Cardiovascular:     Rate and Rhythm: Normal rate and regular rhythm.     Heart sounds: Normal heart sounds.  Pulmonary:     Effort: Pulmonary effort is normal.     Breath  sounds: Normal breath sounds.  Abdominal:     General: Bowel sounds are normal.     Palpations: Abdomen is soft.  Musculoskeletal:     Cervical back: Normal range of motion.  Lymphadenopathy:     Cervical: No cervical adenopathy.  Skin:    General: Skin is warm and dry.     Capillary Refill: Capillary refill takes less than 2 seconds.  Neurological:     Mental Status: She is alert and oriented to person, place, and time.  Psychiatric:        Behavior: Behavior normal.     Musculoskeletal Exam: C-spine was in good range of motion.  She had limited range of motion of her lumbar spine with forward flexion.  Schober's was positive.  She had no tenderness over SI joints.  Shoulder joints, elbow joints, wrist joints, MCPs PIPs and DIPs with good range of motion with no synovitis.  Hip joints, knee joints, ankles, MTPs and PIPs with good range of motion with no synovitis.  There is evidence of Planter fasciitis and Achilles tendinitis.  CDAI Exam: CDAI Score: -- Patient Global: --; Provider Global: -- Swollen: --; Tender: -- Joint Exam 05/09/2021    No joint exam has been documented for this visit   There is currently no information documented on the homunculus. Go to the Rheumatology activity and complete the homunculus joint exam.  Investigation: No additional findings.  Imaging: No results found.  Recent Labs: Lab Results  Component Value Date   WBC 10.0 11/16/2019   HGB 9.7 (L) 11/16/2019   PLT 688 (H) 11/16/2019   NA 141 11/16/2019   K 3.6 11/16/2019   CL 105 11/16/2019   CO2 26 11/16/2019   GLUCOSE 90 11/16/2019   BUN 12 11/16/2019   CREATININE 0.77 11/16/2019   BILITOT 0.4 11/16/2019   ALKPHOS 77 11/16/2019   AST 16 11/16/2019   ALT 14 11/16/2019   PROT 8.1 11/16/2019   ALBUMIN 3.7 11/16/2019   CALCIUM 9.1 11/16/2019   GFRAA >60 11/16/2019   QFTBGOLD NEGATIVE 08/20/2017    July 03, 2015 all autoimmune labs were negative.  Acute hepatitis panel was negative.  G6PD negative.  HIV negative.  Immunoglobulins normal.  RF negative, ANA negative, anti-CCP negative, SPEP negative, TB Gold negative.  August 2016 synovial fluid from the right hip showed WBC count 29,980 with 88% neutrophils culture negative sed rate 79, C-reactive protein 10.6.  MRI of the right hip joint showed synovitis with effusion.  MRI of the lumbar spine was unremarkable. Speciality Comments: No specialty comments available.  Procedures:  No procedures performed Allergies: Patient has no known allergies.   Assessment / Plan:     Visit Diagnoses: Ankylosing spondylitis of multiple sites in spine (H[facility]) - +HLA B27, elevated ESR, CRP, positive synovitis and effusion and right hip joint, right SI joint sclerosis.  Patient was last seen in the office in 2018.  She took Humira from 2016 till 2017 for ankylosing spondylitis and recurrent iritis.  She stopped the medication due to cost.  After 2018 visit she did not restart Humira.  She states her joint pain resolved over time.  She still continues to have recurrent iritis for which she has been  followed by Dr. Prudencio Burly.  She denies any discomfort in her joints.  Although she had limited forward flexion today.- Plan: XR Lumbar Spine 2-3 Views, x-rays of the lumbar spine showed facet joint arthropathy.  No disc space narrowing or syndesmophytes were noted.  In the  past she has had arthritis and synovitis in her hands, right hip, knee joints and her feet.  I did not notice any synovitis on examination today.  Sedimentation rate  Counseled patient that Humira is a TNF blocking agent.  Counseled patient on purpose, proper use, and adverse effects of Humira.  Reviewed the most common adverse effects including infections, headache, and injection site reactions. Discussed that there is the possibility of an increased risk of malignancy including non-melanoma skin cancer but it is not well understood if this increased risk is due to the medication or the disease state.  Advised patient to get yearly dermatology exams due to risk of skin cancer. Counseled patient that Humira should be held prior to scheduled surgery.  Counseled patient to avoid live vaccines while on Humira.  Recommend annual influenza, PCV 15 or PCV20 or Pneumovax 23, and Shingrix as indicated.  Reviewed the importance of regular labs while on Humira therapy.  Will monitor CBC and CMP 1 month after starting and then every 3 months routinely thereafter. Will monitor TB gold annually. Standing orders placed.    Provided patient with medication education material and answered all questions.  Patient consented to Humira.  Will upload consent into the media tab.  Reviewed storage instructions of Humira.  Advised initial injection must be administered in office.  Patient verbalized understanding.   Dermatology referral will be placed at new start visit.  Dose will be for ankylosing spondylitis Humira 40 mg every 14 days.  Prescription pending lab results and/or insurance approval.   Sacroiliitis ([place]) - XR of pelvis showed right SI joint sclerosis  in the past.  She had no SI joint tenderness.  She denies any morning stiffness.- Plan: XR Pelvis 1-2 Views.  X-ray showed bilateral SI joint sclerosis more prominent on the right side.  No hip joint narrowing was noted.  Iritis - 09/02/15: ANA-, anti-CCP negative, RF-.  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  Patient states Dr. Prudencio Burly recommended that she should go back on Humira to prevent vision loss.  High risk medication use - Started on Humira in 2016-noncompliance w/ labs, visits, and injections.09/02/15: hep panel-, G6PDH WNL, HIV-, immunoglobulins WNL, SPEP negative for M-spike -I will obtain following labs today and apply for Humira.  Indications side effects contraindications were discussed at length.  Handout was given and consent was taken.  Plan: CBC with Differential/Platelet, COMPLETE METABOLIC PANEL WITH GFR, Hepatitis B core antibody, IgM, Hepatitis B surface antigen, Hepatitis C antibody, QuantiFERON-TB Gold Plus, Serum protein electrophoresis with reflex, IgG, IgA, IgM  Essential hypertension-her blood pressure is elevated today.  Complications from elevated blood pressure was discussed.  Patient states she was on antihypertensive medications but she discontinued them.  She will contact her PCP today and will start on antihypertensive.  History of panic attacks-she is currently not taking any medications.  History of cholecystectomy  Ex-smoker - occasional x 3 years  Orders: Orders Placed This Encounter  Procedures   XR Pelvis 1-2 Views   XR Lumbar Spine 2-3 Views   CBC with Differential/Platelet   COMPLETE METABOLIC PANEL WITH GFR   Sedimentation rate   Hepatitis B core antibody, IgM   Hepatitis B surface antigen   Hepatitis C antibody   QuantiFERON-TB Gold Plus   Serum protein electrophoresis with reflex   IgG, IgA, IgM    No orders of the defined types were placed in this encounter.   Face-to-face time spent with patient was 45 minutes. Greater than 50%  of time was spent in counseling and coordination of care.  Follow-Up Instructions: Return for Ankylosing spondylitis,iritis.   Bo Merino, MD  Note - This record has been created using Editor, commissioning.  Chart creation errors have been sought, but may not always  have been located. Such creation errors do not reflect on  the standard of medical care.

## 2021-05-09 ENCOUNTER — Ambulatory Visit: Payer: Self-pay

## 2021-05-09 ENCOUNTER — Telehealth: Payer: Self-pay | Admitting: Pharmacist

## 2021-05-09 ENCOUNTER — Ambulatory Visit (INDEPENDENT_AMBULATORY_CARE_PROVIDER_SITE_OTHER): Payer: Managed Care, Other (non HMO) | Admitting: Rheumatology

## 2021-05-09 ENCOUNTER — Other Ambulatory Visit (HOSPITAL_COMMUNITY): Payer: Self-pay

## 2021-05-09 ENCOUNTER — Other Ambulatory Visit: Payer: Self-pay

## 2021-05-09 ENCOUNTER — Encounter: Payer: Self-pay | Admitting: Rheumatology

## 2021-05-09 VITALS — BP 164/112 | HR 90 | Ht 64.0 in | Wt 176.2 lb

## 2021-05-09 DIAGNOSIS — M450 Ankylosing spondylitis of multiple sites in spine: Secondary | ICD-10-CM

## 2021-05-09 DIAGNOSIS — H209 Unspecified iridocyclitis: Secondary | ICD-10-CM | POA: Diagnosis not present

## 2021-05-09 DIAGNOSIS — M461 Sacroiliitis, not elsewhere classified: Secondary | ICD-10-CM

## 2021-05-09 DIAGNOSIS — I10 Essential (primary) hypertension: Secondary | ICD-10-CM

## 2021-05-09 DIAGNOSIS — Z79899 Other long term (current) drug therapy: Secondary | ICD-10-CM

## 2021-05-09 DIAGNOSIS — Z8659 Personal history of other mental and behavioral disorders: Secondary | ICD-10-CM

## 2021-05-09 DIAGNOSIS — Z9049 Acquired absence of other specified parts of digestive tract: Secondary | ICD-10-CM

## 2021-05-09 DIAGNOSIS — Z87891 Personal history of nicotine dependence: Secondary | ICD-10-CM

## 2021-05-09 NOTE — Telephone Encounter (Addendum)
Received notification from [facility] regarding a prior authorization for [place]. Authorization has been APPROVED from 05/09/2021 to 05/09/2022.   Patient must fill through [facility]: [phone]  Key: [id number] Authorization # [id number]   Per [employer] Complete portal, patient had been initially enrolled for Humira Complete Savings Card on 01/04/2018.

## 2021-05-09 NOTE — Patient Instructions (Signed)
Adalimumab Injection What is this medication? ADALIMUMAB (ay da LIM yoo mab) is used to treat rheumatoid and psoriatic arthritis. It is also used to treat ankylosing spondylitis, Crohn's disease,ulcerative colitis, plaque psoriasis, hidradenitis suppurativa, and uveitis. This medicine may be used for other purposes; ask your health care provider orpharmacist if you have questions. COMMON BRAND NAME(S): CYLTEZO, Humira What should I tell my care team before I take this medication? They need to know if you have any of these conditions: cancer diabetes (high blood sugar) having surgery heart disease hepatitis B immune system problems infections, such as tuberculosis (TB) or other bacterial, fungal, or viral infections multiple sclerosis recent or upcoming vaccine an unusual reaction to adalimumab, mannitol, latex, rubber, other medicines, foods, dyes, or preservatives pregnant or trying to get pregnant breast-feeding How should I use this medication? This medicine is for injection under the skin. You will be taught how to prepare and give it. Take it as directed on the prescription label. Keep takingit unless your health care provider tells you to stop. It is important that you put your used needles and syringes in a special sharps container. Do not put them in a trash can. If you do not have a sharpscontainer, call your pharmacist or health care provider to get one. This medicine comes with INSTRUCTIONS FOR USE. Ask your pharmacist for directions on how to use this medicine. Read the information carefully. Talk toyour pharmacist or health care provider if you have questions. A special MedGuide will be given to you by the pharmacist with eachprescription and refill. Be sure to read this information carefully each time. Talk to your pediatrician regarding the use of this medicine in children. While this drug may be prescribed for children as young as 2 years for selectedconditions, precautions do  apply. Overdosage: If you think you have taken too much of this medicine contact apoison control center or emergency room at once. NOTE: This medicine is only for you. Do not share this medicine with others. What if I miss a dose? If you miss a dose, take it as soon as you can. If it is almost time for your next dose, take only that dose. Do not take double or extra doses. It is important not to miss any doses. Talk to your health care provider about whatto do if you miss a dose. What may interact with this medication? Do not take this medicine with any of the following medications: abatacept anakinra biologic medicines such as certolizumab, etanercept, golimumab, infliximab live virus vaccines This medicine may also interact with the following medications: cyclosporine theophylline vaccines warfarin This list may not describe all possible interactions. Give your health care provider a list of all the medicines, herbs, non-prescription drugs, or dietary supplements you use. Also tell them if you smoke, drink alcohol, or use illegaldrugs. Some items may interact with your medicine. What should I watch for while using this medication? Visit your health care provider for regular checks on your progress. Tell your health care provider if your symptoms do not start to get better or if they getworse. You will be tested for tuberculosis (TB) before you start this medicine. If your doctor prescribes any medicine for TB, you should start taking the TB medicine before starting this medicine. Make sure to finish the full course ofTB medicine. This medicine may increase your risk of getting an infection. Call your health care provider for advice if you get a fever, chills, sore throat, or other symptoms of a cold  or flu. Do not treat yourself. Try to avoid being aroundpeople who are sick. Talk to your health care provider about your risk of cancer. You may be more atrisk for certain types of cancer if you  take this medicine. What side effects may I notice from receiving this medication? Side effects that you should report to your doctor or health care professionalas soon as possible: allergic reactions like skin rash, itching or hives, swelling of the face, lips, or tongue changes in vision chest pain dizziness heart failure (trouble breathing; fast, irregular heartbeat; sudden weight gain; swelling of the ankles, feet, hands; unusually weak or tired) infection (fever, chills, cough, sore throat, pain or trouble passing urine) liver injury (dark yellow or brown urine; general ill feeling or flu-like symptoms; loss of appetite, right upper belly pain; unusually weak or tired, yellowing of the eyes or skin) lump or swollen lymph nodes on the neck, groin, or underarm area muscle weakness pain, tingling, numbness in the hands or feet red, scaly patches or raised bumps on the skin trouble breathing unusual bleeding or bruising unusually weak or tired Side effects that usually do not require medical attention (report to yourdoctor or health care professional if they continue or are bothersome): headache nausea pain, redness, or irritation at site where injected stuffy or runny nose This list may not describe all possible side effects. Call your doctor for medical advice about side effects. You may report side effects to FDA at1-800-FDA-1088. Where should I keep my medication? Keep out of the reach of children and pets. Store in the refrigerator between 2 and 8 degrees C (36 and 46 degrees F). Do not freeze. Keep this medicine in the original packaging until you are ready to take it. Protect from light. Get rid of any unused medicine after theexpiration date. This medicine may be stored at room temperature for up to 14 days. Keep this medicine in the original packaging. Protect from light. If it is stored at room temperature, get rid of any unused medicine after 14 days or after it  expires,whichever is first. To get rid of medicines that are no longer needed or have expired: Take the medicine to a medicine take-back program. Check with your pharmacy or law enforcement to find a location. If you cannot return the medicine, ask your pharmacist or health care provider how to get rid of this medicine safely. NOTE: This sheet is a summary. It may not cover all possible information. If you have questions about this medicine, talk to your doctor, pharmacist, orhealth care provider.  2022 Elsevier/Gold Standard (2019-12-15 17:28:40)

## 2021-05-09 NOTE — Telephone Encounter (Signed)
Please start Humira BIV.  Dose: 40mg  every 14 days  Dx: M45.0 (ankylosing spondylitis)  Previously tried therapies: NSAIDs Humira - stopped d/t cost  Knox Saliva, PharmD, MPH, BCPS Clinical Pharmacist (Rheumatology and Pulmonology)

## 2021-05-09 NOTE — Progress Notes (Signed)
Pharmacy Note Subjective: Patient presents today to [facility] for follow up office visit. Patient seen by the pharmacist for counseling on Humira for ankylosing spondylitis.  Prior therapy includes: Humira (stopped therapy d/t cost and lost to f/u), previously tried NSAIDs without improvement. She was referred by ophthalmologist, Dr. Prudencio Burly. Has had 2 episodes of iritis.   Diagnosis of heart failure: No  Objective:  CBC    Component Value Date/Time   WBC 10.0 11/16/2019 1222   RBC 3.26 (L) 11/16/2019 1222   HGB 9.7 (L) 11/16/2019 1222   HCT 30.1 (L) 11/16/2019 1222   PLT 688 (H) 11/16/2019 1222   MCV 92.3 11/16/2019 1222   MCH 29.8 11/16/2019 1222   MCHC 32.2 11/16/2019 1222   RDW 13.9 11/16/2019 1222   LYMPHSABS 1.9 11/16/2019 1222   MONOABS 0.4 11/16/2019 1222   EOSABS 0.1 11/16/2019 1222   BASOSABS 0.0 11/16/2019 1222     CMP     Component Value Date/Time   NA 141 11/16/2019 1222   K 3.6 11/16/2019 1222   CL 105 11/16/2019 1222   CO2 26 11/16/2019 1222   GLUCOSE 90 11/16/2019 1222   BUN 12 11/16/2019 1222   CREATININE 0.77 11/16/2019 1222   CREATININE 0.82 08/20/2017 0915   CALCIUM 9.1 11/16/2019 1222   PROT 8.1 11/16/2019 1222   ALBUMIN 3.7 11/16/2019 1222   AST 16 11/16/2019 1222   ALT 14 11/16/2019 1222   ALKPHOS 77 11/16/2019 1222   BILITOT 0.4 11/16/2019 1222   GFRNONAA >60 11/16/2019 1222   GFRNONAA 95 08/20/2017 0915   GFRAA >60 11/16/2019 1222   GFRAA 110 08/20/2017 0915     Baseline Immunosuppressant Therapy Labs TB GOLD   Hepatitis Panel Hepatitis 02/05/2012  Hep B Surface Ag Negative   HIV Lab Results  Component Value Date   HIV Non Reactive 10/07/2018   HIV Non-reactive 02/05/2012   Immunoglobulins   SPEP Serum Protein Electrophoresis Latest Ref Rng & Units 11/16/2019  Total Protein 6.5 - 8.1 g/dL 8.1   Chest x-ray: 10/06/18 - negative, no active cardiopulmonary disease  Assessment/Plan:  Counseled patient that Humira  is a TNF blocking agent.  Counseled patient on purpose, proper use, and adverse effects of Humira.  Reviewed the most common adverse effects including infections, headache, and injection site reactions. Discussed that there is the possibility of an increased risk of malignancy including non-melanoma skin cancer but it is not well understood if this increased risk is due to the medication or the disease state.  Advised patient to get yearly dermatology exams due to risk of skin cancer. Counseled patient that Humira should be held prior to scheduled surgery.  Counseled patient to avoid live vaccines while on Humira.  Recommend annual influenza, PCV 15 or PCV20 or Pneumovax 23, and Shingrix as indicated.  Baseline immunosuppressive labs drawn today. Patient does not use contraception but have reviewed that clinical data for Humira use during pregnancy and family planning  Reviewed the importance of regular labs while on Humira therapy. Will monitor CBC and CMP 1 month after starting and then every 3 months routinely thereafter. Will monitor TB gold annually. Standing orders placed. Provided patient with medication education material and answered all questions.  Patient consented to Humira.  Will upload consent into the media tab.  Reviewed storage instructions of Humira.  Advised initial injection must be administered in office.  Patient verbalized understanding.   Dermatology referral will be placed at new start visit.  Dose will be  for ankylosing spondylitis Humira 40 mg every 14 days.  Prescription pending lab results and/or insurance approval.  Knox Saliva, PharmD, MPH, BCPS Clinical Pharmacist (Rheumatology and Pulmonology)

## 2021-05-10 ENCOUNTER — Other Ambulatory Visit (HOSPITAL_COMMUNITY): Payer: Self-pay

## 2021-05-13 ENCOUNTER — Other Ambulatory Visit (HOSPITAL_COMMUNITY): Payer: Self-pay

## 2021-05-13 LAB — PROTEIN ELECTROPHORESIS, SERUM, WITH REFLEX
Albumin ELP: 4.5 g/dL (ref 3.8–4.8)
Alpha 1: 0.3 g/dL (ref 0.2–0.3)
Alpha 2: 0.5 g/dL (ref 0.5–0.9)
Beta 2: 0.3 g/dL (ref 0.2–0.5)
Beta Globulin: 0.5 g/dL (ref 0.4–0.6)
Gamma Globulin: 1.4 g/dL (ref 0.8–1.7)
Total Protein: 7.5 g/dL (ref 6.1–8.1)

## 2021-05-13 LAB — COMPLETE METABOLIC PANEL WITH GFR
AG Ratio: 1.7 (calc) (ref 1.0–2.5)
ALT: 9 U/L (ref 6–29)
AST: 14 U/L (ref 10–30)
Albumin: 4.6 g/dL (ref 3.6–5.1)
Alkaline phosphatase (APISO): 71 U/L (ref 31–125)
BUN: 9 mg/dL (ref 7–25)
CO2: 29 mmol/L (ref 20–32)
Calcium: 9.6 mg/dL (ref 8.6–10.2)
Chloride: 103 mmol/L (ref 98–110)
Creat: 0.88 mg/dL (ref 0.50–0.97)
Globulin: 2.7 g/dL (calc) (ref 1.9–3.7)
Glucose, Bld: 91 mg/dL (ref 65–99)
Potassium: 4.3 mmol/L (ref 3.5–5.3)
Sodium: 139 mmol/L (ref 135–146)
Total Bilirubin: 0.3 mg/dL (ref 0.2–1.2)
Total Protein: 7.3 g/dL (ref 6.1–8.1)
eGFR: 87 mL/min/{1.73_m2} (ref >=60)

## 2021-05-13 LAB — QUANTIFERON-TB GOLD PLUS
Mitogen-NIL: 3.55 IU/mL
NIL: 0.04 IU/mL
QuantiFERON-TB Gold Plus: NEGATIVE
TB1-NIL: <0 IU/mL
TB2-NIL: <0 IU/mL

## 2021-05-13 LAB — HEPATITIS C ANTIBODY
Hepatitis C Ab: NONREACTIVE
SIGNAL TO CUT-OFF: 0.03 (ref <1.00)

## 2021-05-13 LAB — CBC WITH DIFFERENTIAL/PLATELET
Absolute Monocytes: 341 cells/uL (ref 200–950)
Basophils Absolute: 28 cells/uL (ref 0–200)
Basophils Relative: 0.5 %
Eosinophils Absolute: 149 cells/uL (ref 15–500)
Eosinophils Relative: 2.7 %
HCT: 42 % (ref 35.0–45.0)
Hemoglobin: 13.7 g/dL (ref 11.7–15.5)
Lymphs Abs: 1705 cells/uL (ref 850–3900)
MCH: 30.6 pg (ref 27.0–33.0)
MCHC: 32.6 g/dL (ref 32.0–36.0)
MCV: 93.8 fL (ref 80.0–100.0)
MPV: 9 fL (ref 7.5–12.5)
Monocytes Relative: 6.2 %
Neutro Abs: 3278 cells/uL (ref 1500–7800)
Neutrophils Relative %: 59.6 %
Platelets: 447 10*3/uL — ABNORMAL HIGH (ref 140–400)
RBC: 4.48 10*6/uL (ref 3.80–5.10)
RDW: 13.1 % (ref 11.0–15.0)
Total Lymphocyte: 31 %
WBC: 5.5 10*3/uL (ref 3.8–10.8)

## 2021-05-13 LAB — IGG, IGA, IGM
IgG (Immunoglobin G), Serum: 1581 mg/dL (ref 600–1640)
IgM, Serum: 119 mg/dL (ref 50–300)
Immunoglobulin A: 210 mg/dL (ref 47–310)

## 2021-05-13 LAB — HEPATITIS B SURFACE ANTIGEN: Hepatitis B Surface Ag: NONREACTIVE

## 2021-05-13 LAB — SEDIMENTATION RATE: Sed Rate: 2 mm/h (ref 0–20)

## 2021-05-13 LAB — HEPATITIS B CORE ANTIBODY, IGM: Hep B C IgM: NONREACTIVE

## 2021-05-13 NOTE — Progress Notes (Signed)
All the labs are within normal limits.  It is okay to start on Humira when approved.

## 2021-05-13 NOTE — Telephone Encounter (Addendum)
Patient enrolled in Humira Complete savings card.  Billing information:  ID: [id number] Issued: 05/13/2021 GROUP: [id number] PCN: [id number]  ATC patient to review lab results and schedule Humira new start visit. Unable to reach. Left VM requesting return call. Will f/u  Knox Saliva, PharmD, MPH, BCPS Clinical Pharmacist (Rheumatology and Pulmonology)

## 2021-05-14 NOTE — Telephone Encounter (Signed)
Ivin Booty C reached out to patient requesting return call. Left VM. Will f/u on Thursday, 05/16/21

## 2021-05-24 NOTE — Telephone Encounter (Signed)
ATC #2 patient to schedule Humira new start visit. Unable to reach. Left VM requesting return call. She also did not return call about lab results but read MyChart message that was sent. Will ATC next week  Knox Saliva, PharmD, MPH, BCPS Clinical Pharmacist (Rheumatology and Pulmonology)

## 2021-05-27 ENCOUNTER — Telehealth: Payer: Self-pay

## 2021-05-27 NOTE — Telephone Encounter (Signed)
Patient called stating she was returning Devki's call.

## 2021-05-28 NOTE — Telephone Encounter (Signed)
Returned call to patient regarding Humira new start. Unable to reach and left VM requesting return call to schedule. Will route to scheduling team to have patient scheduled since she has been difficult to reach

## 2021-05-28 NOTE — Telephone Encounter (Signed)
Returned call to patient regarding Humira new start. Unable to reach and left VM requesting return call to schedule. Will route to scheduling team to have patient scheduled since she has been difficult to reach  Knox Saliva, PharmD, MPH, BCPS Clinical Pharmacist (Rheumatology and Pulmonology)

## 2021-05-29 NOTE — Progress Notes (Deleted)
Office Visit Note  Patient: Kirsten Long             Date of Birth: [birth year]           MRN: [id number]             PCP: Billie Ruddy, MD Referring: Billie Ruddy, MD Visit Date: 06/11/2021 Occupation: '@GUAROCC' @  Subjective:  No chief complaint on file.   History of Present Illness: Kirsten Long is a 35 y.o. female ***   Activities of Daily Living:  Patient reports morning stiffness for *** {minute/hour:19697}.   Patient {ACTIONS;DENIES/REPORTS:21021675::"Denies"} nocturnal pain.  Difficulty dressing/grooming: {ACTIONS;DENIES/REPORTS:21021675::"Denies"} Difficulty climbing stairs: {ACTIONS;DENIES/REPORTS:21021675::"Denies"} Difficulty getting out of chair: {ACTIONS;DENIES/REPORTS:21021675::"Denies"} Difficulty using hands for taps, buttons, cutlery, and/or writing: {ACTIONS;DENIES/REPORTS:21021675::"Denies"}  No Rheumatology ROS completed.   PMFS History:  Patient Active Problem List   Diagnosis Date Noted   IUFD at less than 20 weeks of gestation 11/04/2019   H/O ankylosing spondylitis 10/07/2018   Headache 10/07/2018   Neck pain 10/07/2018   Ankylosing spondylitis of multiple sites in spine ([place]) 08/20/2017   Sacroiliitis ([place]) 08/20/2017   Iritis 08/20/2017   History of panic attacks 08/20/2017   Essential hypertension 08/20/2017   History of cholecystectomy 08/20/2017   Ex-smoker 08/20/2017    Past Medical History:  Diagnosis Date   Arthritis    HSV (herpes simplex virus) infection    Hypertension    Infection    Valtrex for HSV 2009    Urinary tract infection     Family History  Problem Relation Age of Onset   Hypertension Mother    Hypertension Father    Cancer Father    Healthy Brother    Healthy Brother    Hypertension Maternal Aunt    Hypertension Maternal Uncle    Cancer Maternal Grandmother    Hypertension Maternal Grandmother    Hypertension Maternal Grandfather    Hypertension Paternal Grandmother     Stroke Paternal Grandmother    Hypertension Paternal Grandfather    Stroke Paternal Grandfather    Past Surgical History:  Procedure Laterality Date   CHOLECYSTECTOMY  10/15/2012   Procedure: LAPAROSCOPIC CHOLECYSTECTOMY WITH INTRAOPERATIVE CHOLANGIOGRAM;  Surgeon: Shann Medal, MD;  Location: [facility];  Service: General;  Laterality: N/A;   DILATION AND EVACUATION N/A 11/16/2019   Procedure: DILATATION AND EVACUATION;  Surgeon: Thurnell Lose, MD;  Location: [place];  Service: Gynecology;  Laterality: N/A;   OPERATIVE ULTRASOUND N/A 11/16/2019   Procedure: OPERATIVE ULTRASOUND;  Surgeon: Thurnell Lose, MD;  Location: [facility];  Service: Gynecology;  Laterality: N/A;   Social History   Social History Narrative   Not on file   Immunization History  Administered Date(s) Administered   PFIZER(Purple Top)SARS-COV-2 Vaccination 03/05/2020, 03/26/2020   Tdap 09/23/2012     Objective: Vital Signs: There were no vitals taken for this visit.   Physical Exam   Musculoskeletal Exam: ***  CDAI Exam: CDAI Score: -- Patient Global: --; Provider Global: -- Swollen: --; Tender: -- Joint Exam 06/11/2021   No joint exam has been documented for this visit   There is currently no information documented on the homunculus. Go to the Rheumatology activity and complete the homunculus joint exam.  Investigation: No additional findings.  Imaging: XR Lumbar Spine 2-3 Views  Result Date: 05/09/2021 No significant disc space narrowing was noted.  No syndesmophytes were noted.  Facet joint arthropathy was noted. Impression: These findings are consistent with facet joint arthropathy.  XR Pelvis 1-2 Views  Result Date: 05/09/2021 Bilateral SI joint sclerosis was noted.  More prominent sclerosis on the right side.  No comparison x-rays of the pelvis were available.  No hip joint narrowing was noted. Impression: Bilateral SI joint sclerosis more prominent  sclerosis on the right side was noted.   Recent Labs: Lab Results  Component Value Date   WBC 5.5 05/09/2021   HGB 13.7 05/09/2021   PLT 447 (H) 05/09/2021   NA 139 05/09/2021   K 4.3 05/09/2021   CL 103 05/09/2021   CO2 29 05/09/2021   GLUCOSE 91 05/09/2021   BUN 9 05/09/2021   CREATININE 0.88 05/09/2021   BILITOT 0.3 05/09/2021   ALKPHOS 77 11/16/2019   AST 14 05/09/2021   ALT 9 05/09/2021   PROT 7.3 05/09/2021   PROT 7.5 05/09/2021   ALBUMIN 3.7 11/16/2019   CALCIUM 9.6 05/09/2021   GFRAA >60 11/16/2019   QFTBGOLD NEGATIVE 08/20/2017   QFTBGOLDPLUS NEGATIVE 05/09/2021   May 09, 2021 SPEP normal, TB Gold negative, immunoglobulins normal, hepatitis B-, hepatitis C negative, ESR 2   Speciality Comments: No specialty comments available.  Procedures:  No procedures performed Allergies: Patient has no known allergies.   Assessment / Plan:     Visit Diagnoses: No diagnosis found.  Orders: No orders of the defined types were placed in this encounter.  No orders of the defined types were placed in this encounter.   Face-to-face time spent with patient was *** minutes. Greater than 50% of time was spent in counseling and coordination of care.  Follow-Up Instructions: No follow-ups on file.   Bo Merino, MD  Note - This record has been created using Editor, commissioning.  Chart creation errors have been sought, but may not always  have been located. Such creation errors do not reflect on  the standard of medical care.

## 2021-05-30 ENCOUNTER — Ambulatory Visit: Payer: 59 | Admitting: Pharmacist

## 2021-05-30 ENCOUNTER — Other Ambulatory Visit: Payer: Self-pay

## 2021-05-30 ENCOUNTER — Other Ambulatory Visit (HOSPITAL_COMMUNITY): Payer: Self-pay

## 2021-05-30 VITALS — BP 156/105 | HR 80

## 2021-05-30 DIAGNOSIS — M450 Ankylosing spondylitis of multiple sites in spine: Secondary | ICD-10-CM

## 2021-05-30 MED ORDER — HUMIRA (2 PEN) 40 MG/0.4ML ~~LOC~~ AJKT: 40.0000 mg | AUTO-INJECTOR | SUBCUTANEOUS | 0 refills | Status: DC

## 2021-05-30 NOTE — Progress Notes (Signed)
Pharmacy Note  Subjective:   Patient presents to clinic today to receive first dose of Humira. She states that she previously stopped Humira due to fear with injection. Prior notes state that Humira was discontinued possibly due to cost.  Patient running a fever or have signs/symptoms of infection? No  Patient currently on antibiotics for the treatment of infection? No  Patient have any upcoming invasive procedures/surgeries? No  Objective: CMP     Component Value Date/Time   NA 139 05/09/2021 0917   K 4.3 05/09/2021 0917   CL 103 05/09/2021 0917   CO2 29 05/09/2021 0917   GLUCOSE 91 05/09/2021 0917   BUN 9 05/09/2021 0917   CREATININE 0.88 05/09/2021 0917   CALCIUM 9.6 05/09/2021 0917   PROT 7.3 05/09/2021 0917   PROT 7.5 05/09/2021 0917   ALBUMIN 3.7 11/16/2019 1222   AST 14 05/09/2021 0917   ALT 9 05/09/2021 0917   ALKPHOS 77 11/16/2019 1222   BILITOT 0.3 05/09/2021 0917   GFRNONAA >60 11/16/2019 1222   GFRNONAA 95 08/20/2017 0915   GFRAA >60 11/16/2019 1222   GFRAA 110 08/20/2017 0915    CBC    Component Value Date/Time   WBC 5.5 05/09/2021 0917   RBC 4.48 05/09/2021 0917   HGB 13.7 05/09/2021 0917   HCT 42.0 05/09/2021 0917   PLT 447 (H) 05/09/2021 0917   MCV 93.8 05/09/2021 0917   MCH 30.6 05/09/2021 0917   MCHC 32.6 05/09/2021 0917   RDW 13.1 05/09/2021 0917   LYMPHSABS 1,705 05/09/2021 0917   MONOABS 0.4 11/16/2019 1222   EOSABS 149 05/09/2021 0917   BASOSABS 28 05/09/2021 0917    Baseline Immunosuppressant Therapy Labs TB GOLD Quantiferon TB Gold Latest Ref Rng & Units 05/09/2021  Quantiferon TB Gold Plus NEGATIVE NEGATIVE   Hepatitis Panel Hepatitis Latest Ref Rng & Units 05/09/2021  Hep B Surface Ag NON-REACTIVE NON-REACTIVE  Hep B IgM NON-REACTIVE NON-REACTIVE  Hep C Ab NON-REACTIVE NON-REACTIVE  Hep C Ab NON-REACTIVE NON-REACTIVE   HIV Lab Results  Component Value Date   HIV Non Reactive 10/07/2018   HIV Non-reactive 02/05/2012    Immunoglobulins Immunoglobulin Electrophoresis Latest Ref Rng & Units 05/09/2021  IgA  47 - 310 mg/dL 210  IgG 600 - 1,640 mg/dL 1,581  IgM 50 - 300 mg/dL 119   SPEP Serum Protein Electrophoresis Latest Ref Rng & Units 05/09/2021  Total Protein 6.1 - 8.1 g/dL 7.5  Albumin 3.8 - 4.8 g/dL 4.5  Alpha-1 0.2 - 0.3 g/dL 0.3  Alpha-2 0.5 - 0.9 g/dL 0.5  Beta Globulin 0.4 - 0.6 g/dL 0.5  Beta 2 0.2 - 0.5 g/dL 0.3  Gamma Globulin 0.8 - 1.7 g/dL 1.4   Chest x-ray: 10/06/18 - no active cardiopulmonary disease  Assessment/Plan:  Reiterated need to hold Humira for signs/symptoms of infection, if she starts antibiotics, and for surgeries. She verbalized understanding.  Demonstrated proper injection technique with Humira demo device  Patient able to demonstrate proper injection technique using the teach back method.  Patient self injected in the right abdomen with assistance from me with:  Sample Medication: Humira '40mg'$ /0.4 mL penkit NDC: [id number] Lot: [id number] Expiration: 08/2022  Patient tolerated well.  Observed for 30 mins in office for adverse reaction and none noted.   She will continue Humira '40mg'$  every 14 days. She will plan to have her mother administer Humira. Patient is to return in 1 month for labs and 6-8 weeks for follow-up appointment.  Standing orders placed.  Humira approved through insurance .   Rx sent to: [facility]: [phone].  Patient provided with pharmacy phone number, copay card information, and advised to call later this week to schedule shipment to home.  All questions encouraged and answered.  Instructed patient to call with any further questions or concerns.  Knox Saliva, PharmD, MPH, BCPS Clinical Pharmacist (Rheumatology and Pulmonology)  05/30/2021 8:11 AM

## 2021-05-30 NOTE — Patient Instructions (Addendum)
Your next Humira dose is due on 8/25, 9/8, and every 14 days (every other Thursday) thereafter  Your prescription will be shipped from [facility]. Their phone number is [phone] Please call to schedule shipment and confirm address. They will mail your medication to your home.  Your copay should be affordable. If you call the pharmacy and it is not affordable, please double-check that they are billing through your copay card as secondary coverage. That copay card information is: ID: [id number] GROUP: [id number] BIN: [id number] PCN: [id number] Issued: 05/13/2021  Labs are due in 1 month then every 3 months. Lab hours are from Monday to Thursday 1:30-4:30pm and Friday 1:30-4pm. You do not need an appointment if you come for labs during these times.  How to manage an injection site reaction: Remember the 5 C's: COUNTER - leave on the counter at least 30 minutes but up to overnight to bring medication to room temperature. This may help prevent stinging COLD - place something cold (like an ice gel pack or cold water bottle) on the injection site just before cleansing with alcohol. This may help reduce pain CLARITIN - use Claritin (generic name is loratadine) for the first two weeks of treatment or the day of, the day before, and the day after injecting. This will help to minimize injection site reactions CORTISONE CREAM - apply if injection site is irritated and itching CALL ME - if injection site reaction is bigger than the size of your fist, looks infected, blisters, or if you develop hive

## 2021-05-31 NOTE — Telephone Encounter (Signed)
Patient restarted Humira yesterday  05/30/21. Will close encounter

## 2021-06-04 ENCOUNTER — Other Ambulatory Visit (HOSPITAL_COMMUNITY): Payer: Self-pay

## 2021-06-11 ENCOUNTER — Ambulatory Visit: Payer: BC Managed Care – PPO | Admitting: Rheumatology

## 2021-06-11 DIAGNOSIS — Z79899 Other long term (current) drug therapy: Secondary | ICD-10-CM

## 2021-06-11 DIAGNOSIS — H209 Unspecified iridocyclitis: Secondary | ICD-10-CM

## 2021-06-11 DIAGNOSIS — M450 Ankylosing spondylitis of multiple sites in spine: Secondary | ICD-10-CM

## 2021-06-11 DIAGNOSIS — Z9049 Acquired absence of other specified parts of digestive tract: Secondary | ICD-10-CM

## 2021-06-11 DIAGNOSIS — I10 Essential (primary) hypertension: Secondary | ICD-10-CM

## 2021-06-11 DIAGNOSIS — Z8659 Personal history of other mental and behavioral disorders: Secondary | ICD-10-CM

## 2021-06-11 DIAGNOSIS — M47816 Spondylosis without myelopathy or radiculopathy, lumbar region: Secondary | ICD-10-CM

## 2021-06-11 DIAGNOSIS — M461 Sacroiliitis, not elsewhere classified: Secondary | ICD-10-CM

## 2021-06-11 DIAGNOSIS — Z87891 Personal history of nicotine dependence: Secondary | ICD-10-CM

## 2021-06-13 ENCOUNTER — Telehealth: Payer: Self-pay

## 2021-06-13 NOTE — Telephone Encounter (Signed)
Patient called stating she received a call from [facility] regarding her Humira medication and also from [facility].  Patient requested a return call to let her know which pharmacy she should order the medication from.

## 2021-06-14 NOTE — Telephone Encounter (Signed)
Called pharmacy and advised that she has [facility] benefits so special department handles her prescriptions. Called patient and provided her with phone number with extension [phone] extension [phone]).  Expected shipment date to patient's home is 06/20/21  Knox Saliva, PharmD, MPH, BCPS Clinical Pharmacist (Rheumatology and Pulmonology)

## 2021-07-26 ENCOUNTER — Ambulatory Visit: Payer: BC Managed Care – PPO | Admitting: Rheumatology

## 2021-07-26 DIAGNOSIS — I10 Essential (primary) hypertension: Secondary | ICD-10-CM

## 2021-07-26 DIAGNOSIS — Z8659 Personal history of other mental and behavioral disorders: Secondary | ICD-10-CM

## 2021-07-26 DIAGNOSIS — M461 Sacroiliitis, not elsewhere classified: Secondary | ICD-10-CM

## 2021-07-26 DIAGNOSIS — M450 Ankylosing spondylitis of multiple sites in spine: Secondary | ICD-10-CM

## 2021-07-26 DIAGNOSIS — F17200 Nicotine dependence, unspecified, uncomplicated: Secondary | ICD-10-CM

## 2021-07-26 DIAGNOSIS — H209 Unspecified iridocyclitis: Secondary | ICD-10-CM

## 2021-07-26 DIAGNOSIS — Z79899 Other long term (current) drug therapy: Secondary | ICD-10-CM

## 2021-08-05 NOTE — Progress Notes (Signed)
Office Visit Note  Patient: Kirsten Long             Date of Birth: 1986-05-07           MRN: [id number]             PCP: Billie Ruddy, MD Referring: Billie Ruddy, MD Visit Date: 08/16/2021 Occupation: _0 @  Subjective:  Medication management.   History of Present Illness: Kirsten Long is a 35 y.o. female with a history of ankylosing spondylitis and iritis.  She started Humira on May 30, 2021.  She states that she is doing much better and most of her symptoms have almost resolved.  She denies any lower back pain.  There is no history of joint swelling.  She has had no recurrence of iritis.  There is no history of Achilles tendinitis or plantar fasciitis.  Activities of Daily Living:  Patient reports morning stiffness for 0 minutes.   Patient Denies nocturnal pain.  Difficulty dressing/grooming: Denies Difficulty climbing stairs: Denies Difficulty getting out of chair: Denies Difficulty using hands for taps, buttons, cutlery, and/or writing: Denies  Review of Systems  Constitutional:  Negative for fatigue.  HENT:  Negative for mouth sores, mouth dryness and nose dryness.   Eyes:  Negative for pain, itching and dryness.  Respiratory:  Negative for shortness of breath and difficulty breathing.   Cardiovascular:  Positive for hypertension. Negative for chest pain and palpitations.  Gastrointestinal:  Negative for blood in stool, constipation and diarrhea.  Endocrine: Negative for increased urination.  Genitourinary:  Negative for difficulty urinating.  Musculoskeletal:  Negative for joint pain, joint pain, joint swelling, myalgias, morning stiffness, muscle tenderness and myalgias.  Skin:  Negative for color change, rash, redness and sensitivity to sunlight.  Allergic/Immunologic: Negative for susceptible to infections.  Neurological:  Positive for numbness. Negative for dizziness, headaches, memory loss and weakness.   Hematological:  Negative for bruising/bleeding tendency and swollen glands.  Psychiatric/Behavioral:  Negative for depressed mood and confusion. The patient is not nervous/anxious.    PMFS History:  Patient Active Problem List   Diagnosis Date Noted   IUFD at less than 20 weeks of gestation 11/04/2019   H/O ankylosing spondylitis 10/07/2018   Headache 10/07/2018   Neck pain 10/07/2018   Ankylosing spondylitis of multiple sites in spine ([place]) 08/20/2017   Sacroiliitis ([place]) 08/20/2017   Iritis 08/20/2017   History of panic attacks 08/20/2017   Essential hypertension 08/20/2017   History of cholecystectomy 08/20/2017   Ex-smoker 08/20/2017    Past Medical History:  Diagnosis Date   Arthritis    HSV (herpes simplex virus) infection    Hypertension    Infection    Valtrex for HSV 2009    Urinary tract infection     Family History  Problem Relation Age of Onset   Hypertension Mother    Hypertension Father    Cancer Father    Healthy Brother    Healthy Brother    Hypertension Maternal Aunt    Hypertension Maternal Uncle    Cancer Maternal Grandmother    Hypertension Maternal Grandmother    Hypertension Maternal Grandfather    Hypertension Paternal Grandmother    Stroke Paternal Grandmother    Hypertension Paternal Grandfather    Stroke Paternal Grandfather    Past Surgical History:  Procedure Laterality Date   CHOLECYSTECTOMY  10/15/2012   Procedure: LAPAROSCOPIC CHOLECYSTECTOMY WITH INTRAOPERATIVE CHOLANGIOGRAM;  Surgeon: Shann Medal, MD;  Location: [facility];  Service: General;  Laterality: N/A;   DILATION AND EVACUATION N/A 11/16/2019   Procedure: DILATATION AND EVACUATION;  Surgeon: Thurnell Lose, MD;  Location: [facility];  Service: Gynecology;  Laterality: N/A;   OPERATIVE ULTRASOUND N/A 11/16/2019   Procedure: OPERATIVE ULTRASOUND;  Surgeon: Thurnell Lose, MD;  Location: [facility];  Service: Gynecology;  Laterality: N/A;    Social History   Social History Narrative   Not on file   Immunization History  Administered Date(s) Administered   PFIZER(Purple Top)SARS-COV-2 Vaccination 03/05/2020, 03/26/2020   Tdap 09/23/2012     Objective: Vital Signs: BP (!) 147/109 (BP Location: Right Arm, Patient Position: Sitting, Cuff Size: Normal)   Pulse 73   Ht _0  (1.6 m)   Wt 174 lb (78.9 kg)   BMI 30.82 kg/m    Physical Exam Vitals and nursing note reviewed.  Constitutional:      Appearance: She is well-developed.  HENT:     Head: Normocephalic and atraumatic.  Eyes:     Conjunctiva/sclera: Conjunctivae normal.  Cardiovascular:     Rate and Rhythm: Normal rate and regular rhythm.     Heart sounds: Normal heart sounds.  Pulmonary:     Effort: Pulmonary effort is normal.     Breath sounds: Normal breath sounds.  Abdominal:     General: Bowel sounds are normal.     Palpations: Abdomen is soft.  Musculoskeletal:     Cervical back: Normal range of motion.  Lymphadenopathy:     Cervical: No cervical adenopathy.  Skin:    General: Skin is warm and dry.     Capillary Refill: Capillary refill takes less than 2 seconds.  Neurological:     Mental Status: She is alert and oriented to person, place, and time.  Psychiatric:        Behavior: Behavior normal.     Musculoskeletal Exam: C-spine was in good range of motion.  She she had limited range of motion of her lumbar spine.  She had no tenderness over SI joints.  Shoulder joints, elbow joints, wrist joints, MCPs PIPs and DIPs with good range of motion with no synovitis.  Hip joints, knee joints, ankles, MTPs and PIPs with good range of motion with no synovitis.  There was no evidence of Achilles tendinitis or plantar fasciitis.  CDAI Exam: CDAI Score: -- Patient Global: --; Provider Global: -- Swollen: --; Tender: -- Joint Exam 08/16/2021   No joint exam has been documented for this visit   There is currently no information documented on the  homunculus. Go to the Rheumatology activity and complete the homunculus joint exam.  Investigation: No additional findings.  Imaging: No results found.  Recent Labs: Lab Results  Component Value Date   WBC 5.5 05/09/2021   HGB 13.7 05/09/2021   PLT 447 (H) 05/09/2021   NA 139 05/09/2021   K 4.3 05/09/2021   CL 103 05/09/2021   CO2 29 05/09/2021   GLUCOSE 91 05/09/2021   BUN 9 05/09/2021   CREATININE 0.88 05/09/2021   BILITOT 0.3 05/09/2021   ALKPHOS 77 11/16/2019   AST 14 05/09/2021   ALT 9 05/09/2021   PROT 7.3 05/09/2021   PROT 7.5 05/09/2021   ALBUMIN 3.7 11/16/2019   CALCIUM 9.6 05/09/2021   GFRAA >60 11/16/2019   QFTBGOLD NEGATIVE 08/20/2017   QFTBGOLDPLUS NEGATIVE 05/09/2021   May 09, 2021 SPEP normal, immunoglobulins normal, TB Gold negative, hepatitis B-, hepatitis C negative, ESR 2   Speciality Comments: HUmira 2016-2017-dcd cost, Humira  restarted 05/30/21  Procedures:  No procedures performed Allergies: Patient has no known allergies.   Assessment / Plan:     Visit Diagnoses: Ankylosing spondylitis of multiple sites in spine (HCC) - HLA-B27 positive, elevated sedimentation rate, right SI joint sclerosis, positive synovitis and effusion on MRI of right hip joint.  She is doing much better since she started Humira on May 30, 2021.  She denies any joint pain or joint discomfort.  She continues to have limited range of motion of her lumbar spine which she is related to type hamstrings.  Some stretching exercises were demonstrated in the office.  Sacroiliitis (HCC) - History of chronic SI joint pain.  X-rays showed right SI joint sclerosis.  She had no SI joint tenderness today.  Iritis - History of recurrent iritis.  She has been followed by Dr. Prudencio Burly, who recommended restarting Humira to prevent vision loss.  Patient states that her last iritis flare was in March and has not had any recurrence since then.  High risk medication use - Humira 2016 till 2017  which was discontinued due to the cost.  Humira 40 mg subcu every other week was restarted on May 30, 2021.  We will obtain CBC with differential and CMP with GFR today.  She has been advised to get labs every 3 months to monitor for drug toxicity.  TB Gold was done on May 09, 2021.  She will have yearly TB Gold.  Information regarding immunization was placed in the AVS.  She was advised to get immunization up to date.  She was also advised to stop Humira and patient develops an infection.  She was advised to schedule an appointment with the dermatologist to to screen for nonmelanoma skin cancer annually while she is on Humira.  Per her request we will refer her to dermatology.  Arthropathy of lumbar facet joint - She has chronic lower back pain.  X-rays of the lumbar spine showed no syndesmophytes.  Facet joint arthropathy was noted.  She denies any lower back discomfort today.  Essential hypertension -blood pressure was elevated today.  She has not started her hypertension medications.  She states that she will contact her PCP and will start on antihypertensives.  Increased risk of heart disease with ankylosing spondylitis was discussed.  Dietary modifications and exercise was emphasized and the information was placed in the AVS.  History of panic attacks  Ex-smoker - She smoked occasionally off and on for 3 years.  Orders: Orders Placed This Encounter  Procedures   CBC with Differential/Platelet   COMPLETE METABOLIC PANEL WITH GFR   Ambulatory referral to Dermatology    No orders of the defined types were placed in this encounter.    Follow-Up Instructions: Return in about 3 months (around 11/16/2021) for Ankylosing spondylitis, Iritis.   Bo Merino, MD  Note - This record has been created using Editor, commissioning.  Chart creation errors have been sought, but may not always  have been located. Such creation errors do not reflect on  the standard of medical care.

## 2021-08-16 ENCOUNTER — Other Ambulatory Visit: Payer: Self-pay

## 2021-08-16 ENCOUNTER — Encounter: Payer: Self-pay | Admitting: Rheumatology

## 2021-08-16 ENCOUNTER — Ambulatory Visit: Payer: BC Managed Care – PPO | Admitting: Rheumatology

## 2021-08-16 VITALS — BP 147/109 | HR 73 | Ht 63.0 in | Wt 174.0 lb

## 2021-08-16 DIAGNOSIS — H209 Unspecified iridocyclitis: Secondary | ICD-10-CM

## 2021-08-16 DIAGNOSIS — Z87891 Personal history of nicotine dependence: Secondary | ICD-10-CM

## 2021-08-16 DIAGNOSIS — I10 Essential (primary) hypertension: Secondary | ICD-10-CM

## 2021-08-16 DIAGNOSIS — Z79899 Other long term (current) drug therapy: Secondary | ICD-10-CM | POA: Diagnosis not present

## 2021-08-16 DIAGNOSIS — M450 Ankylosing spondylitis of multiple sites in spine: Secondary | ICD-10-CM

## 2021-08-16 DIAGNOSIS — M461 Sacroiliitis, not elsewhere classified: Secondary | ICD-10-CM

## 2021-08-16 DIAGNOSIS — M47816 Spondylosis without myelopathy or radiculopathy, lumbar region: Secondary | ICD-10-CM

## 2021-08-16 DIAGNOSIS — Z8659 Personal history of other mental and behavioral disorders: Secondary | ICD-10-CM

## 2021-08-16 LAB — CBC WITH DIFFERENTIAL/PLATELET
Absolute Monocytes: 330 cells/uL (ref 200–950)
Basophils Absolute: 28 cells/uL (ref 0–200)
Basophils Relative: 0.5 %
Eosinophils Absolute: 162 cells/uL (ref 15–500)
Eosinophils Relative: 2.9 %
HCT: 41.1 % (ref 35.0–45.0)
Hemoglobin: 13.5 g/dL (ref 11.7–15.5)
Lymphs Abs: 2083 cells/uL (ref 850–3900)
MCH: 30.4 pg (ref 27.0–33.0)
MCHC: 32.8 g/dL (ref 32.0–36.0)
MCV: 92.6 fL (ref 80.0–100.0)
MPV: 9.6 fL (ref 7.5–12.5)
Monocytes Relative: 5.9 %
Neutro Abs: 2996 cells/uL (ref 1500–7800)
Neutrophils Relative %: 53.5 %
Platelets: 433 10*3/uL — ABNORMAL HIGH (ref 140–400)
RBC: 4.44 10*6/uL (ref 3.80–5.10)
RDW: 13.1 % (ref 11.0–15.0)
Total Lymphocyte: 37.2 %
WBC: 5.6 10*3/uL (ref 3.8–10.8)

## 2021-08-16 LAB — COMPLETE METABOLIC PANEL WITH GFR
AG Ratio: 1.6 (calc) (ref 1.0–2.5)
ALT: 9 U/L (ref 6–29)
AST: 14 U/L (ref 10–30)
Albumin: 4.6 g/dL (ref 3.6–5.1)
Alkaline phosphatase (APISO): 61 U/L (ref 31–125)
BUN: 13 mg/dL (ref 7–25)
CO2: 30 mmol/L (ref 20–32)
Calcium: 10 mg/dL (ref 8.6–10.2)
Chloride: 105 mmol/L (ref 98–110)
Creat: 0.85 mg/dL (ref 0.50–0.97)
Globulin: 2.8 g/dL (calc) (ref 1.9–3.7)
Glucose, Bld: 69 mg/dL (ref 65–99)
Potassium: 4.2 mmol/L (ref 3.5–5.3)
Sodium: 140 mmol/L (ref 135–146)
Total Bilirubin: 0.5 mg/dL (ref 0.2–1.2)
Total Protein: 7.4 g/dL (ref 6.1–8.1)
eGFR: 91 mL/min/{1.73_m2} (ref >=60)

## 2021-08-16 NOTE — Patient Instructions (Signed)
Standing Labs We placed an order today for your standing lab work.   Please have your standing labs drawn in February and every 3 months  If possible, please have your labs drawn 2 weeks prior to your appointment so that the provider can discuss your results at your appointment.  Please note that you may see your imaging and lab results in [place] before we have reviewed them. We may be awaiting multiple results to interpret others before contacting you. Please allow our office up to 72 hours to thoroughly review all of the results before contacting the office for clarification of your results.  We have open lab daily: Monday through Thursday from 1:30-4:30 PM and Friday from 1:30-4:00 PM at the office of Dr. Bo Merino, [facility].   Please be advised, all patients with office appointments requiring lab work will take precedent over walk-in lab work.  If possible, please come for your lab work on Monday and Friday afternoons, as you may experience shorter wait times. The office is located at [address] [place], [place], [place] [address] No appointment is necessary.   Labs are drawn by [facility]. Please bring your co-pay at the time of your lab draw.  You may receive a bill from [facility] for your lab work.  If you wish to have your labs drawn at another location, please call the office 24 hours in advance to send orders.  If you have any questions regarding directions or hours of operation,  please call [phone].   As a reminder, please drink plenty of water prior to coming for your lab work. Thanks!   Vaccines You are taking a medication(s) that can suppress your immune system.  The following immunizations are recommended: Flu annually Covid-19  Td/Tdap (tetanus, diphtheria, pertussis) every 10 years Pneumonia (Prevnar 15 then Pneumovax 23 at least 1 year apart.  Alternatively, can take Prevnar 20 without needing additional dose) Shingrix: 2 doses from 4  weeks to 6 months apart  Please check with your PCP to make sure you are up to date.   If you have signs or symptoms of an infection or start antibiotics: First, call your PCP for workup of your infection. Hold your medication through the infection, until you complete your antibiotics, and until symptoms resolve if you take the following: Injectable medication (Actemra, Benlysta, Cimzia, Cosentyx, Enbrel, Humira, Kevzara, Orencia, Remicade, Simponi, Stelara, Taltz, Tremfya) Methotrexate Leflunomide (Arava) Mycophenolate (Cellcept) Morrie Sheldon, Olumiant, or Rinvoq  Please get annual skin examination to screen for skin cancer while you are taking Humira Heart Disease Prevention   Your inflammatory disease increases your risk of heart disease which includes heart attack, stroke, atrial fibrillation (irregular heartbeats), high blood pressure, heart failure and atherosclerosis (plaque in the arteries).  It is important to reduce your risk by:   Keep blood pressure, cholesterol, and blood sugar at healthy levels   Smoking Cessation   Maintain a healthy weight  BMI 20-25   Eat a healthy diet  Plenty of fresh fruit, vegetables, and whole grains  Limit saturated fats, foods high in sodium, and added sugars  DASH and Mediterranean diet   Increase physical activity  Recommend moderate physically activity for 150 minutes per week/ 30 minutes a day for five days a week These can be broken up into three separate ten-minute sessions during the day.   Reduce Stress  Meditation, slow breathing exercises, yoga, coloring books  Dental visits twice a year

## 2021-08-18 NOTE — Progress Notes (Signed)
Platelets are mildly elevated and stable.  CMP is normal.

## 2021-11-15 ENCOUNTER — Ambulatory Visit: Payer: BC Managed Care – PPO | Admitting: Physician Assistant

## 2022-02-19 ENCOUNTER — Ambulatory Visit: Payer: BC Managed Care – PPO | Admitting: Physician Assistant

## 2022-04-25 ENCOUNTER — Telehealth: Payer: Self-pay | Admitting: Pharmacist

## 2022-04-25 NOTE — Telephone Encounter (Signed)
Per [employer] portal, patient's Humira auth set to expire however patient has not been seen in clinic since August 2022.   Knox Saliva, PharmD, MPH, BCPS, CPP Clinical Pharmacist (Rheumatology and Pulmonology)

## 2022-07-02 ENCOUNTER — Encounter (HOSPITAL_COMMUNITY): Payer: Self-pay

## 2022-07-02 ENCOUNTER — Inpatient Hospital Stay (HOSPITAL_COMMUNITY): Payer: Managed Care, Other (non HMO)

## 2022-07-02 ENCOUNTER — Inpatient Hospital Stay (HOSPITAL_COMMUNITY)
Admission: AD | Admit: 2022-07-02 | Discharge: 2022-07-02 | Disposition: A | Discharge status: Home / Self Care | Payer: Managed Care, Other (non HMO) | Attending: Obstetrics and Gynecology | Admitting: Obstetrics and Gynecology

## 2022-07-02 DIAGNOSIS — Z3A08 8 weeks gestation of pregnancy: Secondary | ICD-10-CM | POA: Diagnosis not present

## 2022-07-02 DIAGNOSIS — Z3A01 Less than 8 weeks gestation of pregnancy: Secondary | ICD-10-CM

## 2022-07-02 DIAGNOSIS — O3411 Maternal care for benign tumor of corpus uteri, first trimester: Secondary | ICD-10-CM | POA: Diagnosis not present

## 2022-07-02 DIAGNOSIS — O26891 Other specified pregnancy related conditions, first trimester: Secondary | ICD-10-CM | POA: Diagnosis present

## 2022-07-02 DIAGNOSIS — O10911 Unspecified pre-existing hypertension complicating pregnancy, first trimester: Secondary | ICD-10-CM | POA: Diagnosis not present

## 2022-07-02 DIAGNOSIS — O09521 Supervision of elderly multigravida, first trimester: Secondary | ICD-10-CM | POA: Diagnosis not present

## 2022-07-02 DIAGNOSIS — N760 Acute vaginitis: Secondary | ICD-10-CM | POA: Insufficient documentation

## 2022-07-02 DIAGNOSIS — D259 Leiomyoma of uterus, unspecified: Secondary | ICD-10-CM | POA: Diagnosis not present

## 2022-07-02 DIAGNOSIS — B9689 Other specified bacterial agents as the cause of diseases classified elsewhere: Secondary | ICD-10-CM

## 2022-07-02 DIAGNOSIS — O209 Hemorrhage in early pregnancy, unspecified: Secondary | ICD-10-CM | POA: Insufficient documentation

## 2022-07-02 DIAGNOSIS — Z3491 Encounter for supervision of normal pregnancy, unspecified, first trimester: Secondary | ICD-10-CM

## 2022-07-02 LAB — CBC
HCT: 34.7 % — ABNORMAL LOW (ref 36.0–46.0)
Hemoglobin: 12 g/dL (ref 12.0–15.0)
MCH: 30.7 pg (ref 26.0–34.0)
MCHC: 34.6 g/dL (ref 30.0–36.0)
MCV: 88.7 fL (ref 80.0–100.0)
Platelets: 383 10*3/uL (ref 150–400)
RBC: 3.91 MIL/uL (ref 3.87–5.11)
RDW: 12.9 % (ref 11.5–15.5)
WBC: 7.7 10*3/uL (ref 4.0–10.5)
nRBC: 0 % (ref 0.0–0.2)

## 2022-07-02 LAB — WET PREP, GENITAL
Sperm: NONE SEEN
Trich, Wet Prep: NONE SEEN
WBC, Wet Prep HPF POC: >=10 — AB (ref <10)
Yeast Wet Prep HPF POC: NONE SEEN

## 2022-07-02 LAB — POCT PREGNANCY, URINE: Preg Test, Ur: POSITIVE — AB

## 2022-07-02 LAB — HCG, QUANTITATIVE, PREGNANCY: hCG, Beta Chain, Quant, S: 135266 m[IU]/mL — ABNORMAL HIGH (ref <5)

## 2022-07-02 MED ORDER — NIFEDIPINE ER OSMOTIC RELEASE 30 MG PO TB24: 30.0000 mg | ORAL_TABLET | Freq: Every day | ORAL | 11 refills | Status: DC

## 2022-07-02 MED ORDER — NIFEDIPINE ER OSMOTIC RELEASE 30 MG PO TB24
30.0000 mg | ORAL_TABLET | Freq: Once | ORAL | Status: AC
  Administered 2022-07-02: 30 mg via ORAL
  Filled 2022-07-02: qty 1

## 2022-07-02 MED ORDER — METRONIDAZOLE 500 MG PO TABS: 500.0000 mg | ORAL_TABLET | Freq: Two times a day (BID) | ORAL | 0 refills | Status: DC

## 2022-07-02 NOTE — MAU Note (Signed)
.  Kirsten Long is a 36 y.o. at Unknown here in MAU reporting: confirmation of pregnancy, has had VB with small amount of clotting for the last 3 weeks. Pt reports the VB has been dark brown spotting with wiping, and today increased with wiping and is red with small amount of clots. Pt report lower left ABD cramping that is intermittent. Pt denies LOF, abnormal discharge, and loss of tissue. No intercourse in the last 48 hours.  Hx of CHTN not currently on medication LMP: 05/02/2022 Onset of complaint: last 3 weeks Pain score: 2/10 Vitals:   07/02/22 2136  BP: (!) 167/102  Pulse: 63  Resp: 18  Temp: 98.3 F (36.8 C)  SpO2: 100%      Lab orders placed from triage:  UA, POCT preg

## 2022-07-02 NOTE — Discharge Instructions (Signed)

## 2022-07-02 NOTE — MAU Provider Note (Addendum)
History     CSN: [id number]  Arrival date and time: 07/02/22 2101   Event Date/Time   First Provider Initiated Contact with Patient 07/02/22 2144      Chief Complaint  Patient presents with   Vaginal Bleeding   Abdominal Pain   confirmation of pregnancy   HPI  Kirsten Long is a 36 y.o. [id number] at 92w5dwho presents for evaluation of vaginal bleeding. Patient reports she has been seeing brown discharge for a while but today it became more red and she was concerned. She denies any pain.   She denies any discharge and leaking of fluid. Denies any constipation, diarrhea or any urinary complaints.  Patient states she has CHTN and was prescribed medication several years ago but took herself off the medication 2-3 years ago. She denies any headache, chest pain or shortness of breath.    OB History     Gravida  6   Para  3   Term  3   Preterm  0   AB  1   Living  3      SAB  1   IAB  0   Ectopic  0   Multiple  0   Live Births  3           Past Medical History:  Diagnosis Date   Arthritis    HSV (herpes simplex virus) infection    Hypertension    Infection    Valtrex for HSV 2009    Urinary tract infection     Past Surgical History:  Procedure Laterality Date   CHOLECYSTECTOMY  10/15/2012   Procedure: LAPAROSCOPIC CHOLECYSTECTOMY WITH INTRAOPERATIVE CHOLANGIOGRAM;  Surgeon: DShann Medal MD;  [facility];  Service: General;  Laterality: N/A;   DILATION AND EVACUATION N/A 11/16/2019   Procedure: DILATATION AND EVACUATION;  Surgeon: VThurnell Lose MD;  [facility]  Service: Gynecology;  Laterality: N/A;   OPERATIVE ULTRASOUND N/A 11/16/2019   Procedure: OPERATIVE ULTRASOUND;  Surgeon: VThurnell Lose MD;  [facility]  Service: Gynecology;  Laterality: N/A;    Family History  Problem Relation Age of Onset   Hypertension Mother    Hypertension Father    Cancer Father     Healthy Brother    Healthy Brother    Hypertension Maternal Aunt    Hypertension Maternal Uncle    Cancer Maternal Grandmother    Hypertension Maternal Grandmother    Hypertension Maternal Grandfather    Hypertension Paternal Grandmother    Stroke Paternal Grandmother    Hypertension Paternal Grandfather    Stroke Paternal Grandfather     Social History   Tobacco Use   Smoking status: Former    Years: 2.00    Types: Cigarettes    Quit date: 12/16/2011    Years since quitting: 10.5   Smokeless tobacco: Never  Vaping Use   Vaping Use: Never used  Substance Use Topics   Alcohol use: Yes    Comment: occassionally   Drug use: No    Allergies: No Known Allergies  No medications prior to admission.    Review of Systems  Constitutional: Negative.  Negative for fatigue and fever.  HENT: Negative.    Respiratory: Negative.  Negative for shortness of breath.   Cardiovascular: Negative.  Negative for chest pain.  Gastrointestinal: Negative.  Negative for abdominal pain, constipation, diarrhea, nausea and vomiting.  Genitourinary:  Positive for vaginal bleeding. Negative for dysuria.  Neurological: Negative.  Negative  for dizziness and headaches.   Physical Exam   Blood pressure (!) 144/96, pulse (!) 59, temperature 98.3 F (36.8 C), temperature source Oral, resp. rate 18, height '5\' 3"'$  (1.6 m), weight 76.7 kg, last menstrual period 05/02/2022, SpO2 100 %, unknown if currently breastfeeding.  Patient Vitals for the past 24 hrs:  BP Temp Temp src Pulse Resp SpO2 Height Weight  07/02/22 2327 (!) 144/96 -- -- (!) 59 18 100 % -- --  07/02/22 2136 (!) 167/102 98.3 F (36.8 C) Oral 63 18 100 % '5\' 3"'$  (1.6 m) 76.7 kg    Physical Exam Vitals and nursing note reviewed.  Constitutional:      General: She is not in acute distress.    Appearance: She is well-developed.  HENT:     Head: Normocephalic.  Eyes:     Pupils: Pupils are equal, round, and reactive to light.   Cardiovascular:     Rate and Rhythm: Normal rate and regular rhythm.     Heart sounds: Normal heart sounds.  Pulmonary:     Effort: Pulmonary effort is normal. No respiratory distress.     Breath sounds: Normal breath sounds.  Abdominal:     General: Bowel sounds are normal. There is no distension.     Palpations: Abdomen is soft.     Tenderness: There is no abdominal tenderness.  Skin:    General: Skin is warm and dry.  Neurological:     Mental Status: She is alert and oriented to person, place, and time.  Psychiatric:        Mood and Affect: Mood normal.        Behavior: Behavior normal.        Thought Content: Thought content normal.        Judgment: Judgment normal.    MAU Course  Procedures  Results for orders placed or performed during the hospital encounter of 07/02/22 (from the past 24 hour(s))  Pregnancy, urine POC     Status: Abnormal   Collection Time: 07/02/22  9:19 PM  Result Value Ref Range   Preg Test, Ur POSITIVE (A) NEGATIVE  Wet prep, genital     Status: Abnormal   Collection Time: 07/02/22 10:00 PM  Result Value Ref Range   Yeast Wet Prep HPF POC NONE SEEN NONE SEEN   Trich, Wet Prep NONE SEEN NONE SEEN   Clue Cells Wet Prep HPF POC PRESENT (A) NONE SEEN   WBC, Wet Prep HPF POC >=10 (A) <10   Sperm NONE SEEN   CBC     Status: Abnormal   Collection Time: 07/02/22 10:19 PM  Result Value Ref Range   WBC 7.7 4.0 - 10.5 K/uL   RBC 3.91 3.87 - 5.11 MIL/uL   Hemoglobin 12.0 12.0 - 15.0 g/dL   HCT 34.7 (L) 36.0 - 46.0 %   MCV 88.7 80.0 - 100.0 fL   MCH 30.7 26.0 - 34.0 pg   MCHC 34.6 30.0 - 36.0 g/dL   RDW 12.9 11.5 - 15.5 %   Platelets 383 150 - 400 K/uL   nRBC 0.0 0.0 - 0.2 %     US OB LESS THAN 14 WEEKS WITH OB TRANSVAGINAL  Result Date: 07/02/2022 CLINICAL DATA:  Spotting and left lower quadrant pain. EXAM: OBSTETRIC <14 WK [place] AND TRANSVAGINAL OB US TECHNIQUE: Both transabdominal and transvaginal ultrasound examinations were performed for  complete evaluation of the gestation as well as the maternal uterus, adnexal regions, and pelvic cul-de-sac. Transvaginal technique was  performed to assess early pregnancy. COMPARISON:  None Available. FINDINGS: Intrauterine gestational sac: Single Yolk sac:  Visualized. Embryo:  Visualized. Cardiac Activity: Visualized. Heart Rate: 152 bpm CRL:  10.3 mm   7 w   1 d                  [place] EDC: 02/17/2023 Subchorionic hemorrhage:  None visualized. Maternal uterus/adnexae: Right ovary appears normal. There is a simple cyst in the left ovary measuring 3.8 x 4.0 x 3.8 cm. Left uterine fibroid is identified measuring 2.9 x 2.8 x 2.9 cm. IMPRESSION: 1. Single live intrauterine gestation measuring 7 weeks 1 day by crown-rump length. 2. No subchorionic hemorrhage. 3. 4.0 cm left ovarian cyst. Consider short-term follow-up ultrasound to confirm resolution. 4. Left uterine fibroid measuring 2.9 cm. Electronically Signed   By: Ronney Asters M.D.   On: 07/02/2022 23:09     MDM Labs ordered and reviewed.   UA, UPT CBC, HCG ABO/Rh- A Pos Wet prep and gc/chlamydia US OB Comp Less 14 weeks with Transvaginal  Procardia 30XL  CNM independently reviewed the imaging ordered. Imaging show live IUP consistent with dates from LMP   Assessment and Plan   1. Normal intrauterine pregnancy on prenatal ultrasound in first trimester   2. [redacted] weeks gestation of pregnancy   3. Bacterial vaginosis   4. Uterine leiomyoma, unspecified location     -Discharge home in stable condition -Rx for metronidazole and procardia sent to patient's pharmacy -First trimester precautions discussed -Patient advised to follow-up with OB as scheduled for prenatal care -Patient may return to MAU as needed or if her condition were to change or worsen  Wende Mott, CNM 07/02/2022, 9:44 PM

## 2022-07-03 LAB — GC/CHLAMYDIA PROBE AMP (~~LOC~~) NOT AT ARMC
Chlamydia: NEGATIVE
Comment: NEGATIVE
Comment: NORMAL
Neisseria Gonorrhea: NEGATIVE

## 2022-08-02 ENCOUNTER — Other Ambulatory Visit: Payer: Self-pay

## 2022-08-02 ENCOUNTER — Inpatient Hospital Stay (HOSPITAL_COMMUNITY)
Admission: AD | Admit: 2022-08-02 | Discharge: 2022-08-02 | Discharge status: Against Medical Advice | Payer: Managed Care, Other (non HMO) | Attending: Obstetrics and Gynecology | Admitting: Obstetrics and Gynecology

## 2022-08-02 ENCOUNTER — Emergency Department (HOSPITAL_BASED_OUTPATIENT_CLINIC_OR_DEPARTMENT_OTHER)
Admission: EM | Admit: 2022-08-02 | Discharge: 2022-08-02 | Disposition: A | Discharge status: Home / Self Care | Payer: No Typology Code available for payment source | Attending: Emergency Medicine | Admitting: Emergency Medicine

## 2022-08-02 DIAGNOSIS — R0789 Other chest pain: Secondary | ICD-10-CM | POA: Diagnosis not present

## 2022-08-02 DIAGNOSIS — O131 Gestational [pregnancy-induced] hypertension without significant proteinuria, first trimester: Secondary | ICD-10-CM | POA: Insufficient documentation

## 2022-08-02 DIAGNOSIS — Z3A11 11 weeks gestation of pregnancy: Secondary | ICD-10-CM | POA: Insufficient documentation

## 2022-08-02 DIAGNOSIS — I10 Essential (primary) hypertension: Secondary | ICD-10-CM

## 2022-08-02 DIAGNOSIS — O26891 Other specified pregnancy related conditions, first trimester: Secondary | ICD-10-CM | POA: Diagnosis not present

## 2022-08-02 LAB — CBC
HCT: 39.3 % (ref 36.0–46.0)
Hemoglobin: 13.7 g/dL (ref 12.0–15.0)
MCH: 30.6 pg (ref 26.0–34.0)
MCHC: 34.9 g/dL (ref 30.0–36.0)
MCV: 87.9 fL (ref 80.0–100.0)
Platelets: 406 10*3/uL — ABNORMAL HIGH (ref 150–400)
RBC: 4.47 MIL/uL (ref 3.87–5.11)
RDW: 12.7 % (ref 11.5–15.5)
WBC: 7.8 10*3/uL (ref 4.0–10.5)
nRBC: 0 % (ref 0.0–0.2)

## 2022-08-02 LAB — PREGNANCY, URINE: Preg Test, Ur: NEGATIVE

## 2022-08-02 LAB — BASIC METABOLIC PANEL
Anion gap: 12 (ref 5–15)
BUN: 6 mg/dL (ref 6–20)
CO2: 23 mmol/L (ref 22–32)
Calcium: 10 mg/dL (ref 8.9–10.3)
Chloride: 102 mmol/L (ref 98–111)
Creatinine, Ser: 0.69 mg/dL (ref 0.44–1.00)
GFR, Estimated: >60 mL/min (ref >60)
Glucose, Bld: 89 mg/dL (ref 70–99)
Potassium: 3.8 mmol/L (ref 3.5–5.1)
Sodium: 137 mmol/L (ref 135–145)

## 2022-08-02 LAB — TROPONIN I (HIGH SENSITIVITY)
Troponin I (High Sensitivity): <2 ng/L (ref <18)
Troponin I (High Sensitivity): <2 ng/L (ref <18)

## 2022-08-02 LAB — HCG, QUANTITATIVE, PREGNANCY: hCG, Beta Chain, Quant, S: 192928 m[IU]/mL — ABNORMAL HIGH (ref <5)

## 2022-08-02 NOTE — Discharge Instructions (Signed)
Your appointment with OB/GYN that you have scheduled for Thursday.  Today's quantitative hCG was 193,000.  Work-up for the chest pain without any acute findings.  Troponins x2 were normal.  Return for any new or worse symptoms.  Blood pressure is elevated here sure that your OB/GYN's will want to start some blood pressure medicine when they see you.  But will leave that up to them.

## 2022-08-02 NOTE — ED Notes (Addendum)
Entered room to find pt resting in dimly lit room -- Pt GCS 15.  RR even and unlabored on RA with symmetrical rise and fall of chest.  Elevated bp (161/103) - pt reports h/o HTN however states she took herself off bp med.  Pt denies active cp - states L side nonradiating cp has been intermittent since waking her from sleep last night- states pain lasts a few seconds during episodes.  Pt denies HA, n/v, sob.  Does mention a few episodes of dizziness with floaters this week but doesn't believe these symptoms are related.  No peripheral edema noted.  Skin warm dry and intact.  Pt denies any immediate needs, questions, concerns - will  monitor for acute changes and maintain plan of care as pt awaits provider.

## 2022-08-02 NOTE — ED Provider Notes (Signed)
[facility] EMERGENCY DEPT Provider Note   CSN: [id number] Arrival date & time: 08/02/22  1640     History  Chief Complaint  Patient presents with   Chest Pain    Kirsten Long is a 36 y.o. female.  Patient here saying she is approximately [redacted] weeks pregnant.  Patient's last menstrual period was around July 16.  Patient is gravida 6 para 3 AB 2 patient's already had [facility] and had an ultrasound that showed an IUP.  Patient with the development of intermittent left anterior chest pain only lasts seconds.  Not associated with any significant shortness of breath.  No nausea vomiting.  No abdominal pain no vaginal bleeding.  Oxygen saturations here are in the upper 90s to 100% on room air.  Blood pressure was 161/103.  But when she arrived but then it went down to 139/98 but still somewhat elevated.       Home Medications Prior to Admission medications   Medication Sig Start Date End Date Taking? Authorizing Provider  Adalimumab (HUMIRA PEN) 40 MG/0.4ML PNKT Inject 40 mg into the skin every 14 (fourteen) days. First dose in clinic 05/30/21 05/30/21   Bo Merino, MD  metroNIDAZOLE (FLAGYL) 500 MG tablet Take 1 tablet (500 mg total) by mouth 2 (two) times daily. 07/02/22   Wende Mott, CNM  NIFEdipine (PROCARDIA-XL/NIFEDICAL-XL) 30 MG 24 hr tablet Take 1 tablet (30 mg total) by mouth daily. 07/02/22 07/02/23  Wende Mott, CNM      Allergies    Patient has no known allergies.    Review of Systems   Review of Systems  Constitutional:  Negative for chills and fever.  HENT:  Negative for ear pain and sore throat.   Eyes:  Negative for pain and visual disturbance.  Respiratory:  Negative for cough and shortness of breath.   Cardiovascular:  Positive for chest pain. Negative for palpitations.  Gastrointestinal:  Negative for abdominal pain and vomiting.  Genitourinary:  Negative for dysuria, hematuria and vaginal bleeding.   Musculoskeletal:  Negative for arthralgias and back pain.  Skin:  Negative for color change and rash.  Neurological:  Negative for seizures and syncope.  All other systems reviewed and are negative.   Physical Exam Updated Vital Signs BP (!) 161/103   Pulse 87   Temp 98.5 F (36.9 C) (Oral)   Resp 20   Ht 1.6 m ('5\' 3"'$ )   Wt 77.1 kg   LMP 05/02/2022 (Exact Date)   SpO2 100%   BMI 30.11 kg/m  Physical Exam Vitals and nursing note reviewed.  Constitutional:      General: She is not in acute distress.    Appearance: She is well-developed. She is not ill-appearing.  HENT:     Head: Normocephalic and atraumatic.  Eyes:     Extraocular Movements: Extraocular movements intact.     Conjunctiva/sclera: Conjunctivae normal.     Pupils: Pupils are equal, round, and reactive to light.  Cardiovascular:     Rate and Rhythm: Normal rate and regular rhythm.     Heart sounds: No murmur heard. Pulmonary:     Effort: Pulmonary effort is normal. No respiratory distress.     Breath sounds: Normal breath sounds. No decreased breath sounds, wheezing, rhonchi or rales.  Chest:     Chest wall: No tenderness or crepitus.  Abdominal:     General: Bowel sounds are normal.     Palpations: Abdomen is soft.     Tenderness: There  is no abdominal tenderness. There is no guarding.  Musculoskeletal:        General: No swelling.     Cervical back: Neck supple.     Right lower leg: No edema.     Left lower leg: Edema present.  Skin:    General: Skin is warm and dry.     Capillary Refill: Capillary refill takes less than 2 seconds.  Neurological:     Mental Status: She is alert.  Psychiatric:        Mood and Affect: Mood normal.     ED Results / Procedures / Treatments   Labs (all labs ordered are listed, but only abnormal results are displayed) Labs Reviewed  CBC - Abnormal; Notable for the following components:      Result Value   Platelets 406 (*)    All other components within normal  limits  HCG, QUANTITATIVE, PREGNANCY - Abnormal; Notable for the following components:   hCG, Beta Chain, Quant, S [id number] (*)    All other components within normal limits  BASIC METABOLIC PANEL  PREGNANCY, URINE  TROPONIN I (HIGH SENSITIVITY)  TROPONIN I (HIGH SENSITIVITY)    EKG None  Radiology No results found.  Procedures Procedures    Medications Ordered in ED Medications - No data to display  ED Course/ Medical Decision Making/ A&P                           Medical Decision Making Amount and/or Complexity of Data Reviewed Labs: ordered.   Patient's urine pregnancy test was negative but this must of been erroneous.  Based on that I ordered a quantitative hCG patient's quantitative is 192, 928 that is up from 137,000 just a few weeks ago.  So patient clearly is pregnant as she expected.  Patient's labs here basic metabolic panel normal CBC normal no dysuria urinalysis not mandatory.  And has not been collected yet patient wants to go home.  EKG without any acute changes.  Avoided doing chest x-ray patient's lungs are very clear oxygen sats are good not concerned about pulmonary embolus not necessarily concerned about an acute lung situation pain is intermittent sharp in nature not made worse by moving anything.  Patient has follow-up with OB/GYN later this week on Thursday.  She will return for any new or worse symptoms.  Patient given precautions about getting seen for any pelvic pain or vaginal bleeding.  Final Clinical Impression(s) / ED Diagnoses Final diagnoses:  Atypical chest pain  [redacted] weeks gestation of pregnancy  Primary hypertension    Rx / DC Orders ED Discharge Orders     None         Fredia Sorrow, MD 08/02/22 2336

## 2022-08-02 NOTE — ED Notes (Addendum)
Pt agreeable with d/c plan as discussed by provider- this nurse has verbally reinforced d/c instructions and provided pt with written copy - pt acknowledges verbal understanding and denies any addl questions, concerns, needs- ambulatory independently at d/c with steady gait; vitals stable; no distress.

## 2022-08-02 NOTE — ED Triage Notes (Signed)
Patient arrives POV to triage with complaints of left side intermittent chest pain since last night.  Rates pain a 7/10.  Patient is also [redacted] weeks pregnant.

## 2022-08-31 ENCOUNTER — Inpatient Hospital Stay (HOSPITAL_BASED_OUTPATIENT_CLINIC_OR_DEPARTMENT_OTHER): Payer: BC Managed Care – PPO

## 2022-08-31 ENCOUNTER — Encounter (HOSPITAL_COMMUNITY): Payer: Self-pay | Admitting: Obstetrics and Gynecology

## 2022-08-31 ENCOUNTER — Other Ambulatory Visit: Payer: Self-pay

## 2022-08-31 ENCOUNTER — Observation Stay (HOSPITAL_COMMUNITY)
Admission: AD | Admit: 2022-08-31 | Discharge: 2022-09-01 | Disposition: A | Discharge status: Home / Self Care | Payer: BC Managed Care – PPO | Attending: Obstetrics and Gynecology | Admitting: Obstetrics and Gynecology

## 2022-08-31 DIAGNOSIS — O42912 Preterm premature rupture of membranes, unspecified as to length of time between rupture and onset of labor, second trimester: Secondary | ICD-10-CM

## 2022-08-31 DIAGNOSIS — D62 Acute posthemorrhagic anemia: Secondary | ICD-10-CM | POA: Clinically undetermined

## 2022-08-31 DIAGNOSIS — Z3A15 15 weeks gestation of pregnancy: Secondary | ICD-10-CM

## 2022-08-31 DIAGNOSIS — O4102X0 Oligohydramnios, second trimester, not applicable or unspecified: Secondary | ICD-10-CM

## 2022-08-31 DIAGNOSIS — N856 Intrauterine synechiae: Secondary | ICD-10-CM | POA: Diagnosis not present

## 2022-08-31 DIAGNOSIS — I10 Essential (primary) hypertension: Secondary | ICD-10-CM | POA: Insufficient documentation

## 2022-08-31 DIAGNOSIS — O09522 Supervision of elderly multigravida, second trimester: Secondary | ICD-10-CM

## 2022-08-31 DIAGNOSIS — Z8759 Personal history of other complications of pregnancy, childbirth and the puerperium: Secondary | ICD-10-CM

## 2022-08-31 DIAGNOSIS — O42919 Preterm premature rupture of membranes, unspecified as to length of time between rupture and onset of labor, unspecified trimester: Secondary | ICD-10-CM

## 2022-08-31 LAB — WET PREP, GENITAL
Clue Cells Wet Prep HPF POC: NONE SEEN
Sperm: NONE SEEN
Trich, Wet Prep: NONE SEEN
WBC, Wet Prep HPF POC: <10 (ref <10)
Yeast Wet Prep HPF POC: NONE SEEN

## 2022-08-31 LAB — CBC
HCT: 22.9 % — ABNORMAL LOW (ref 36.0–46.0)
Hemoglobin: 8 g/dL — ABNORMAL LOW (ref 12.0–15.0)
MCH: 31.4 pg (ref 26.0–34.0)
MCHC: 34.9 g/dL (ref 30.0–36.0)
MCV: 89.8 fL (ref 80.0–100.0)
Platelets: 436 10*3/uL — ABNORMAL HIGH (ref 150–400)
RBC: 2.55 MIL/uL — ABNORMAL LOW (ref 3.87–5.11)
RDW: 13.1 % (ref 11.5–15.5)
WBC: 12.1 10*3/uL — ABNORMAL HIGH (ref 4.0–10.5)
nRBC: 0 % (ref 0.0–0.2)

## 2022-08-31 LAB — RAPID URINE DRUG SCREEN, HOSP PERFORMED
Amphetamines: NOT DETECTED
Barbiturates: NOT DETECTED
Benzodiazepines: NOT DETECTED
Cocaine: NOT DETECTED
Opiates: NOT DETECTED
Tetrahydrocannabinol: NOT DETECTED

## 2022-08-31 LAB — POCT FERN TEST: POCT Fern Test: POSITIVE

## 2022-08-31 MED ORDER — CALCIUM CARBONATE ANTACID 500 MG PO CHEW: 2.0000 | CHEWABLE_TABLET | ORAL | Status: DC | PRN

## 2022-08-31 MED ORDER — OXYCODONE HCL 5 MG PO TABS
10.0000 mg | ORAL_TABLET | ORAL | Status: DC | PRN
  Administered 2022-08-31: 10 mg via ORAL
  Filled 2022-08-31: qty 2

## 2022-08-31 MED ORDER — NIFEDIPINE ER OSMOTIC RELEASE 30 MG PO TB24: 30.0000 mg | ORAL_TABLET | Freq: Every day | ORAL | Status: DC

## 2022-08-31 MED ORDER — ACETAMINOPHEN 325 MG PO TABS: 650.0000 mg | ORAL_TABLET | ORAL | Status: DC | PRN

## 2022-08-31 MED ORDER — MISOPROSTOL 200 MCG PO TABS
600.0000 ug | ORAL_TABLET | ORAL | Status: DC
  Administered 2022-08-31 (×2): 600 ug via VAGINAL
  Filled 2022-08-31 (×2): qty 3

## 2022-08-31 MED ORDER — LACTATED RINGERS IV SOLN: INTRAVENOUS | Status: DC

## 2022-08-31 MED ORDER — DOCUSATE SODIUM 100 MG PO CAPS: 100.0000 mg | ORAL_CAPSULE | Freq: Every day | ORAL | Status: DC

## 2022-08-31 MED ORDER — ADALIMUMAB 40 MG/0.4ML ~~LOC~~ AJKT: 40.0000 mg | AUTO-INJECTOR | SUBCUTANEOUS | Status: DC

## 2022-08-31 NOTE — MAU Note (Addendum)
Kirsten Long is a 36 y.o. at 13w5dhere in MAU reporting: she's been leaking fluid for approximately 1 week.  Reports noticed a trickle a week ago, but today had a gush of fluid after using the restroom.  States fluid is clear.  Denies VB or pain.   LMP: N/A Onset of complaint: 1 week ago Pain score: 0 Vitals:   08/31/22 1409  BP: (!) 155/97  Pulse: (!) 101  Resp: 18  Temp: 98 F (36.7 C)  SpO2: 100%     FHT:167 bpm Lab orders placed from triage:   None

## 2022-08-31 NOTE — MAU Provider Note (Signed)
Chief Complaint: Rupture of Membranes   Event Date/Time   First Provider Initiated Contact with Patient 08/31/22 1430     SUBJECTIVE HPI: Kirsten Long is a 36 y.o. [id number] at 45w5dwho presents to [facility] reporting leaking small amounts of clear fluid several times over the past week and then had a large gush of fluid at 1300 today.   Associated signs and symptoms: Neg, fever, chills, vaginal bleeding, vaginal discharge, abd pain, urinary complaints, GI complaints.  No prenatal care yet this pregnancy. Scheduled to start prenatal care with Dr.  CGarwin Brothers11/15/23. Hx of 17 week SAB in 2021. Presented with something hanging out ofer her vaginal which ended up being a cord. Dx fetal demise and passed baby soon after in MAU. Was preceded by ~1 week of possible LOF. Previous pregnancies were uncomplicated term vaginal deliveries. Asked pt if in retrospect her OB had had any concern for cervical insufficiency but she did not think so.    Hx HTN. Was Rx'd Procardia but did not know she was supposed to keep taking it.   Past Medical History:  Diagnosis Date   Arthritis    HSV (herpes simplex virus) infection    Hypertension    Infection    Valtrex for HSV 2009    Urinary tract infection    OB History  Gravida Para Term Preterm AB Living  '6 3 3 '$ 0 1 3  SAB IAB Ectopic Multiple Live Births  1 0 0 0 3    # Outcome Date GA Lbr Len/2nd Weight Sex Delivery Anes PTL Lv  6 Current           5 Term 09/22/12 [id number]:20 / 00:10 3900 g M Vag-Spont EPI  LIV  4 SAB 2010          3 Term 06/12/08        LIV  2 Term 07/26/05        LIV  1 Gravida            Past Surgical History:  Procedure Laterality Date   CHOLECYSTECTOMY  10/15/2012   Procedure: LAPAROSCOPIC CHOLECYSTECTOMY WITH INTRAOPERATIVE CHOLANGIOGRAM;  Surgeon: DaShann MedalMD;  Location: [facility];  Service: General;  Laterality: N/A;   DILATION AND EVACUATION N/A 11/16/2019   Procedure: DILATATION AND  EVACUATION;  Surgeon: VaThurnell LoseMD;  [facility];  Laterality: N/A;   OPERATIVE ULTRASOUND N/A 11/16/2019   Procedure: OPERATIVE ULTRASOUND;  Surgeon: VaThurnell LoseMD;  [facility];  Laterality: N/A;   Social History   Socioeconomic History   Marital status: Married    Spouse name: Not on file   Number of children: Not on file   Years of education: Not on file   Highest education level: Not on file  Occupational History   Not on file  Tobacco Use   Smoking status: Former    Years: 2.00    Types: Cigarettes    Quit date: 12/16/2011    Years since quitting: 10.7   Smokeless tobacco: Never  Vaping Use   Vaping Use: Never used  Substance and Sexual Activity   Alcohol use: Yes    Comment: occassionally   Drug use: No   Sexual activity: Not Currently    Birth control/protection: None  Other Topics Concern   Not on file  Social History Narrative   Not on file   Social Determinants of Health  Financial Resource Strain: Not on file  Food Insecurity: Not on file  Transportation Needs: Not on file  Physical Activity: Not on file  Stress: Not on file  Social Connections: Not on file  Intimate Partner Violence: Not on file   Family History  Problem Relation Age of Onset   Hypertension Mother    Hypertension Father    Cancer Father    Healthy Brother    Healthy Brother    Hypertension Maternal Aunt    Hypertension Maternal Uncle    Cancer Maternal Grandmother    Hypertension Maternal Grandmother    Hypertension Maternal Grandfather    Hypertension Paternal Grandmother    Stroke Paternal Grandmother    Hypertension Paternal Grandfather    Stroke Paternal Grandfather    No current facility-administered medications on file prior to encounter.   Current Outpatient Medications on File Prior to Encounter  Medication Sig Dispense Refill   Adalimumab (HUMIRA PEN) 40  MG/0.4ML PNKT Inject 40 mg into the skin every 14 (fourteen) days. First dose in clinic 05/30/21 6 each 0   metroNIDAZOLE (FLAGYL) 500 MG tablet Take 1 tablet (500 mg total) by mouth 2 (two) times daily. 14 tablet 0   NIFEdipine (PROCARDIA-XL/NIFEDICAL-XL) 30 MG 24 hr tablet Take 1 tablet (30 mg total) by mouth daily. 30 tablet 11   No Known Allergies  I have reviewed patient's Past Medical Hx, Surgical Hx, Family Hx, Social Hx, medications and allergies.   Review of Systems  Constitutional:  Negative for chills and fever.  Gastrointestinal:  Negative for abdominal pain, constipation, diarrhea, nausea and vomiting.  Genitourinary:  Negative for dysuria, frequency, hematuria, pelvic pain, vaginal bleeding and vaginal discharge.       Positive leaking clear fluid    OBJECTIVE Patient Vitals for the past 24 hrs:  BP Temp Temp src Pulse Resp SpO2 Height Weight  08/31/22 1636 138/84 98.4 F (36.9 C) Oral 81 18 100 % -- --  08/31/22 1418 (!) 140/96 -- -- 92 -- 100 % -- --  08/31/22 1409 (!) 155/97 98 F (36.7 C) Oral (!) 101 18 100 % -- --  08/31/22 1401 -- -- -- -- -- -- '5\' 3"'$  (1.6 m) 76 kg   Constitutional: Well-developed, well-nourished female in no acute distress.  Cardiovascular: normal rate Respiratory: normal rate and effort.  GI: Abd soft, non-tender, gravid appropriate for gestational age.  MS: Extremities nontender, no edema, normal ROM Neurologic: Alert and oriented x 4.  GU: SPECULUM EXAM: NEFG, moderate amount of clear, odorless, fluid, leaking from cervix, physiologic discharge, no blood noted, cervix clean, visually closed, multiparous-appearing.   BIMANUAL: deferred  FHR 167 LAB RESULTS Results for orders placed or performed during the hospital encounter of 08/31/22 (from the past 24 hour(s))  Wet prep, genital     Status: None   Collection Time: 08/31/22  2:46 PM   Specimen: Cervix  Result Value Ref Range   Yeast Wet Prep HPF POC NONE SEEN NONE SEEN   Trich, Wet  Prep NONE SEEN NONE SEEN   Clue Cells Wet Prep HPF POC NONE SEEN NONE SEEN   WBC, Wet Prep HPF POC <10 <10   Sperm NONE SEEN   Fern Test     Status: None   Collection Time: 08/31/22  2:59 PM  Result Value Ref Range   POCT Fern Test Positive = ruptured amniotic membanes     IMAGING [place] MFM OB Limited  Result Date: 08/31/2022 ----------------------------------------------------------------------  OBSTETRICS REPORT                       (  Signed Final 08/31/2022 04:31 pm) ---------------------------------------------------------------------- Patient Info  ID #:       [id number]                          D.O.B.:  27-Jan-1986 (36 yrs)  Name:       Kirsten Burows LATRICE                 Visit Date: 08/31/2022 03:20 pm              Long ---------------------------------------------------------------------- Performed By  Attending:        Johnell Comings MD         Secondary Phy.:    Manya Silvas                                                              CNM  Performed By:     Georgie Chard        Address:           [place], [place]  Referred By:      [facility] MAU[facility]Triage         [facility]                                                              [place] ---------------------------------------------------------------------- Orders  #  Description                           Code        Ordered By  1  [place] MFM OB LIMITED                     42706.23    Manya Silvas ----------------------------------------------------------------------  #  Order #                     Accession #                Episode #  1  [id number]                   [id number]  [id number] ---------------------------------------------------------------------- Indications  Premature rupture of membranes - leaking        O42.90  fluid   Oligohydramnios / Decreased amniotic fluid      O41.00X0  volume  Advanced maternal age multigravida 42+,         O84.522  second trimester  [redacted] weeks gestation of pregnancy                 Z3A.15 ---------------------------------------------------------------------- Fetal Evaluation  Num Of Fetuses:          1  Fetal Heart Rate(bpm):   171  Cardiac Activity:        Observed  Presentation:            Cephalic  Placenta:                Posterior  P. Cord Insertion:       Not well visualized  Amniotic Fluid  AFI FV:      Anhydramnios                              Largest Pocket(cm)                              0 ---------------------------------------------------------------------- OB History  Gravidity:    6         Term:   3        Prem:   0        SAB:   1  TOP:          0       Ectopic:  0        Living: 3 ---------------------------------------------------------------------- Gestational Age  LMP:           17w 2d        Date:  05/02/22                  EDD:   02/06/23  Best:          15w 5d     Det. By:  Previous Ultrasound      EDD:   02/17/23                                      (07/02/22) ---------------------------------------------------------------------- Cervix Uterus Adnexa  Cervix  Length:           3.29  cm.  Measured Transabdominally  Uterus  No abnormality visualized.  Right Ovary  Not visualized.  Left Ovary  Not visualized.  Cul De Sac  No free fluid seen.  Adnexa  No abnormality visualized. ---------------------------------------------------------------------- Myomas  Site                     L(cm)      W(cm)       D(cm)      Location  Left LUS                 3.25       3.69        3.09 ----------------------------------------------------------------------  Blood Flow                  RI       PI       Comments ---------------------------------------------------------------------- Comments  This patient presented to the MAU  due to leakage of fluid at  15+ weeks.  On today's exam, anhydramnios is noted,  indicating that she  most likely has ruptured membranes.  Her ferning test in the  MAU was positive.  A fetal heartbeat is still present.  The fetus is in the vertex presentation.  A 3 to 4 cm left-sided fibroid is noted.  Her cervical length measured transabdominally was 3.29 cm  long without any signs of funneling.  The patient should be admitted for observation to determine if  she will go into spontaneous labor.  As prolonged previable PPROM at 15+ weeks is a condition  that is not compatible with life and as prolonged PPROM may  increase the maternal risk of infection, sepsis, and death, an  induction should be considered if she remains undelivered  and the patient is willing to undergo an induction. ----------------------------------------------------------------------                   Johnell Comings, MD Electronically Signed Final Report   08/31/2022 04:31 pm ----------------------------------------------------------------------   MAU COURSE Orders Placed This Encounter  Procedures   Wet prep, genital   [place] MFM OB Limited   Bed rest   Fern Test   No orders of the defined types were placed in this encounter.   MDM Discussed [place] and pelvic exam results with D.r Annamaria Boots, MFM. Due to anhydramnios secondary to previable PPROM, an IUFD will most likely result with continued expectant management. As prolonging pregnancy following previable PPROM may increase her risk of infection, sepsis, and maternal death, an induction of labor is recommended. Discussed Hx, labs, exam w/ Dr. Elgie Congo. Agrees w/ POC. Will come talk to pt. CNM discussed recommendation    ASSESSMENT 1. Preterm premature rupture of membranes, antepartum   2. [redacted] weeks gestation of pregnancy   Pre-viable PPROM CHTN  PLAN Admit to [facility] Dr. Annamaria Boots recommends inducing labor to avoid risk of infection. May observe for 24 hours to see if she goes in to labor spontaneously and then induce if she does not Dr. Elgie Congo discussing POC with  pt.  HTN Tx per Dr. Elgie Congo.  Tamala Julian, [place], [place] 08/31/2022  6:17 PM

## 2022-09-01 ENCOUNTER — Inpatient Hospital Stay (HOSPITAL_COMMUNITY): Payer: BC Managed Care – PPO | Admitting: Certified Registered Nurse Anesthetist

## 2022-09-01 ENCOUNTER — Encounter (HOSPITAL_COMMUNITY)
Admission: AD | Disposition: A | Discharge status: Home / Self Care | Payer: Self-pay | Source: Home / Self Care | Attending: Obstetrics and Gynecology

## 2022-09-01 ENCOUNTER — Encounter (HOSPITAL_COMMUNITY): Payer: Self-pay | Admitting: Obstetrics and Gynecology

## 2022-09-01 DIAGNOSIS — N856 Intrauterine synechiae: Secondary | ICD-10-CM

## 2022-09-01 DIAGNOSIS — D62 Acute posthemorrhagic anemia: Secondary | ICD-10-CM | POA: Clinically undetermined

## 2022-09-01 DIAGNOSIS — Z8759 Personal history of other complications of pregnancy, childbirth and the puerperium: Secondary | ICD-10-CM

## 2022-09-01 DIAGNOSIS — O09522 Supervision of elderly multigravida, second trimester: Secondary | ICD-10-CM | POA: Diagnosis present

## 2022-09-01 HISTORY — PX: DILATION AND CURETTAGE OF UTERUS: SHX78

## 2022-09-01 HISTORY — PX: DILATION AND EVACUATION: SHX1459

## 2022-09-01 LAB — CBC
HCT: 29 % — ABNORMAL LOW (ref 36.0–46.0)
Hemoglobin: 10.2 g/dL — ABNORMAL LOW (ref 12.0–15.0)
MCH: 31.2 pg (ref 26.0–34.0)
MCHC: 35.2 g/dL (ref 30.0–36.0)
MCV: 88.7 fL (ref 80.0–100.0)
Platelets: 320 10*3/uL (ref 150–400)
RBC: 3.27 MIL/uL — ABNORMAL LOW (ref 3.87–5.11)
RDW: 12.9 % (ref 11.5–15.5)
WBC: 19.9 10*3/uL — ABNORMAL HIGH (ref 4.0–10.5)
nRBC: 0 % (ref 0.0–0.2)

## 2022-09-01 LAB — TYPE AND SCREEN
ABO/RH(D): A POS
Antibody Screen: NEGATIVE

## 2022-09-01 LAB — GC/CHLAMYDIA PROBE AMP (~~LOC~~) NOT AT ARMC
Chlamydia: NEGATIVE
Comment: NEGATIVE
Comment: NORMAL
Neisseria Gonorrhea: NEGATIVE

## 2022-09-01 LAB — RPR: RPR Ser Ql: NONREACTIVE

## 2022-09-01 LAB — ABO/RH: ABO/RH(D): A POS

## 2022-09-01 LAB — HIV ANTIBODY (ROUTINE TESTING W REFLEX): HIV Screen 4th Generation wRfx: NONREACTIVE

## 2022-09-01 SURGERY — DILATION AND EVACUATION, UTERUS
Anesthesia: Choice

## 2022-09-01 MED ORDER — OXYCODONE HCL 5 MG PO TABS: 5.0000 mg | ORAL_TABLET | Freq: Once | ORAL | Status: DC | PRN

## 2022-09-01 MED ORDER — THERA VITAL M PO TABS: 1.0000 | ORAL_TABLET | Freq: Every day | ORAL | Status: DC

## 2022-09-01 MED ORDER — MISOPROSTOL 200 MCG PO TABS
400.0000 ug | ORAL_TABLET | Freq: Once | ORAL | Status: AC
  Administered 2022-09-01: 400 ug via ORAL
  Filled 2022-09-01: qty 2

## 2022-09-01 MED ORDER — DEXTROSE IN LACTATED RINGERS 5 % IV SOLN: INTRAVENOUS | Status: DC

## 2022-09-01 MED ORDER — IBUPROFEN 600 MG PO TABS: 600.0000 mg | ORAL_TABLET | Freq: Four times a day (QID) | ORAL | 0 refills | Status: DC | PRN

## 2022-09-01 MED ORDER — ONDANSETRON HCL 4 MG PO TABS: 4.0000 mg | ORAL_TABLET | Freq: Four times a day (QID) | ORAL | Status: DC | PRN

## 2022-09-01 MED ORDER — ONDANSETRON HCL 4 MG/2ML IJ SOLN
INTRAMUSCULAR | Status: DC | PRN
  Administered 2022-09-01: 4 mg via INTRAVENOUS

## 2022-09-01 MED ORDER — OXYCODONE HCL 5 MG/5ML PO SOLN: 5.0000 mg | Freq: Once | ORAL | Status: DC | PRN

## 2022-09-01 MED ORDER — FENTANYL CITRATE (PF) 100 MCG/2ML IJ SOLN
INTRAMUSCULAR | Status: AC
  Filled 2022-09-01: qty 2

## 2022-09-01 MED ORDER — CEFAZOLIN SODIUM-DEXTROSE 2-3 GM-%(50ML) IV SOLR
INTRAVENOUS | Status: DC | PRN
  Administered 2022-09-01: 2 g via INTRAVENOUS

## 2022-09-01 MED ORDER — ONDANSETRON HCL 4 MG/2ML IJ SOLN: 4.0000 mg | Freq: Four times a day (QID) | INTRAMUSCULAR | Status: DC | PRN

## 2022-09-01 MED ORDER — IBUPROFEN 600 MG PO TABS: 600.0000 mg | ORAL_TABLET | Freq: Three times a day (TID) | ORAL | Status: DC | PRN

## 2022-09-01 MED ORDER — PHENYLEPHRINE HCL (PRESSORS) 10 MG/ML IV SOLN
INTRAVENOUS | Status: DC | PRN
  Administered 2022-09-01: 80 ug via INTRAVENOUS
  Administered 2022-09-01: 160 ug via INTRAVENOUS
  Administered 2022-09-01: 80 ug via INTRAVENOUS
  Administered 2022-09-01: 160 ug via INTRAVENOUS

## 2022-09-01 MED ORDER — LIDOCAINE HCL (CARDIAC) PF 100 MG/5ML IV SOSY
PREFILLED_SYRINGE | INTRAVENOUS | Status: DC | PRN
  Administered 2022-09-01: 50 mg via INTRAVENOUS

## 2022-09-01 MED ORDER — PROPOFOL 10 MG/ML IV BOLUS
INTRAVENOUS | Status: DC | PRN
  Administered 2022-09-01: 150 mg via INTRAVENOUS

## 2022-09-01 MED ORDER — MIDAZOLAM HCL 2 MG/2ML IJ SOLN
INTRAMUSCULAR | Status: DC | PRN
  Administered 2022-09-01: 2 mg via INTRAVENOUS

## 2022-09-01 MED ORDER — MISOPROSTOL 200 MCG PO TABS
400.0000 ug | ORAL_TABLET | Freq: Once | ORAL | Status: AC
  Administered 2022-09-01: 400 ug via BUCCAL
  Filled 2022-09-01: qty 2

## 2022-09-01 MED ORDER — METHYLERGONOVINE MALEATE 0.2 MG/ML IJ SOLN
INTRAMUSCULAR | Status: DC | PRN
  Administered 2022-09-01: .2 mg via INTRAMUSCULAR

## 2022-09-01 MED ORDER — NIFEDIPINE ER OSMOTIC RELEASE 30 MG PO TB24: 30.0000 mg | ORAL_TABLET | Freq: Every day | ORAL | Status: DC

## 2022-09-01 MED ORDER — LACTATED RINGERS IV BOLUS
1000.0000 mL | Freq: Once | INTRAVENOUS | Status: AC
  Administered 2022-09-01: 1000 mL via INTRAVENOUS

## 2022-09-01 MED ORDER — TRANEXAMIC ACID-NACL 1000-0.7 MG/100ML-% IV SOLN
INTRAVENOUS | Status: DC | PRN
  Administered 2022-09-01: 1000 mg via INTRAVENOUS

## 2022-09-01 MED ORDER — FENTANYL CITRATE (PF) 100 MCG/2ML IJ SOLN
INTRAMUSCULAR | Status: DC | PRN
  Administered 2022-09-01: 75 ug via INTRAVENOUS
  Administered 2022-09-01: 25 ug via INTRAVENOUS

## 2022-09-01 MED ORDER — METHYLERGONOVINE MALEATE 0.2 MG PO TABS
0.2000 mg | ORAL_TABLET | ORAL | Status: DC
  Administered 2022-09-01 (×2): 0.2 mg via ORAL
  Filled 2022-09-01 (×2): qty 1

## 2022-09-01 MED ORDER — MIDAZOLAM HCL 2 MG/2ML IJ SOLN
INTRAMUSCULAR | Status: AC
  Filled 2022-09-01: qty 2

## 2022-09-01 MED ORDER — HYDROCODONE-ACETAMINOPHEN 5-325 MG PO TABS: 1.0000 | ORAL_TABLET | ORAL | Status: DC | PRN

## 2022-09-01 MED ORDER — HYDROMORPHONE HCL 1 MG/ML IJ SOLN: 0.2500 mg | INTRAMUSCULAR | Status: DC | PRN

## 2022-09-01 MED ORDER — DEXAMETHASONE SODIUM PHOSPHATE 10 MG/ML IJ SOLN
INTRAMUSCULAR | Status: DC | PRN
  Administered 2022-09-01: 10 mg via INTRAVENOUS

## 2022-09-01 MED ORDER — KETOROLAC TROMETHAMINE 30 MG/ML IJ SOLN: 30.0000 mg | Freq: Once | INTRAMUSCULAR | Status: DC | PRN

## 2022-09-01 MED ORDER — SUCCINYLCHOLINE CHLORIDE 200 MG/10ML IV SOSY
PREFILLED_SYRINGE | INTRAVENOUS | Status: DC | PRN
  Administered 2022-09-01: 100 mg via INTRAVENOUS

## 2022-09-01 MED ORDER — ONDANSETRON HCL 4 MG/2ML IJ SOLN: 4.0000 mg | Freq: Once | INTRAMUSCULAR | Status: DC | PRN

## 2022-09-01 NOTE — Progress Notes (Signed)
Patient ID: Kirsten Long, female   DOB: 04-Nov-1985, 36 y.o.   MRN: [id number] Pt had serial cytotec and delivered the non viable fetus intact at [facility] The placenta did not deliver The cord was clamped I was called to room about 0250 for evaluation of near syepisode after passing a large clot while up to the bathroom It was a vasovagal episode I tried to manually remove at that time without success I ordered another cytotec 400 mics bucally   Since then she has had minimal bleeding  I was called to room again at 0600 approx for the placental edge coming out without delivery I again tried manual removal without success I placed a speculum and tried with ring forceps but could not remove it, it tore in half and still placenta was outside of cervix but it was adherent and could not be removed with the ring forceps  As a result I will take her to the OR for manual removal with or without D&C for retained and adherent placenta  OR and anesthesia staff have been contacted  Florian Buff, MD 09/01/2022 6:33 AM

## 2022-09-01 NOTE — Progress Notes (Signed)
Patient called out for help in bathroom. Sitting on toilet states she thinks the baby came out. Leaned patient forward and baby was laying in measuring hat no signs of life noted. Assisted  back to bed cord clamped and cut no vaginal bleeding noted. Vital signs stable no pain. Dr Elonda Husky notified of delivery and order was given to give 400 mg of Cytotec orally.

## 2022-09-01 NOTE — Transfer of Care (Signed)
Immediate Anesthesia Transfer of Care Note  Patient: Fransisca Kaufmann Worthy-Nesmith  Procedure(s) Performed: DILATATION AND CURETTAGE  Patient Location: PACU  Anesthesia Type:General  Level of Consciousness: awake, alert , oriented, and patient cooperative  Airway & Oxygen Therapy: Patient Spontanous Breathing  Post-op Assessment: Report given to RN and Post -op Vital signs reviewed and stable  Post vital signs: Reviewed  Last Vitals:  Vitals Value Taken Time  BP 143/86 09/01/22 0807  Temp    Pulse 100 09/01/22 0809  Resp 18 09/01/22 0809  SpO2 96 % 09/01/22 0809  Vitals shown include unvalidated device data.  Last Pain:  Vitals:   09/01/22 0700  TempSrc:   PainSc: 5       Patients Stated Pain Goal: 3 (09/81/19 1478)  Complications: No notable events documented.

## 2022-09-01 NOTE — Plan of Care (Signed)
Patient to be discharged home with printed instructions. Khandi Kernes L Satcha Storlie, RN  

## 2022-09-01 NOTE — H&P (Signed)
Attestation signed by Griffin Basil, MD at 08/31/2022  7:50 PM   Attestation of Attending Supervision of Advanced Practice Provider (CNM/NP/PA): Evaluation and management procedures were performed by the Advanced Practice Provider under my supervision and collaboration.  I have reviewed the Advanced Practice Provider's note and chart, and I agree with the management and plan. I have also made any necessary editorial changes.     I have seen the patient personally and discussed plan of care. Reviewed ultrasound findings from today and plan of cre from MFM.  Pt is aware she can be monitored for 24 hours to see if she has spontaneous labor, but the patient declines at this time.  She wishes to immediately start the process. Discussed placement of vaginal cytotec and expectant management.  Also discussed increased risk of retained placenta and possible need for operative management.   Griffin Basil, MD Attending [place], [facility] for [facility], [facility] 08/31/2022 7:42 PM         Expand All Collapse All  Chief Complaint: Rupture of Membranes    Event Date/Time   First Provider Initiated Contact with Patient 08/31/22 1430     SUBJECTIVE HPI: Kirsten Long is a 36 y.o. [id number] at 16w5dwho presents to [facility] reporting leaking small amounts of clear fluid several times over the past week and then had a large gush of fluid at 1300 today.    Associated signs and symptoms: Neg, fever, chills, vaginal bleeding, vaginal discharge, abd pain, urinary complaints, GI complaints.   No prenatal care yet this pregnancy. Scheduled to start prenatal care with Dr.  CGarwin Brothers11/15/23. Hx of 37 week SAB in 2021. Presented with something hanging out ofer her vaginal which ended up being a cord. Dx fetal demise and passed baby soon after in MAU. Was preceded by ~1 week of possible LOF. Previous pregnancies were  uncomplicated term vaginal deliveries. Asked pt if in retrospect her OB had had any concern for cervical insufficiency but she did not think so.     Hx HTN. Was Rx'd Procardia but did not know she was supposed to keep taking it.        Past Medical History:  Diagnosis Date   Arthritis     HSV (herpes simplex virus) infection     Hypertension     Infection      Valtrex for HSV 2009    Urinary tract infection                      OB History  Gravida Para Term Preterm AB Living  '6 3 3 '$ 0 1 3  SAB IAB Ectopic Multiple Live Births     1 0 0 0 3        # Outcome Date GA Lbr Len/2nd Weight Sex Delivery Anes PTL Lv  6 Current                    5 Term 09/22/12 376w2d52:20 / 00:10 3900 g M Vag-Spont EPI   LIV  4 SAB 2010                  3 Term 06/12/08               LIV  2 Term 07/26/05               LIV  1 Gravida  Past Surgical History:  Procedure Laterality Date   CHOLECYSTECTOMY   10/15/2012    Procedure: LAPAROSCOPIC CHOLECYSTECTOMY WITH INTRAOPERATIVE CHOLANGIOGRAM;  Surgeon: Shann Medal, MD;  Location: [facility];  Service: General;  Laterality: N/A;   DILATION AND EVACUATION N/A 11/16/2019    Procedure: DILATATION AND EVACUATION;  Surgeon: Thurnell Lose, MD;  Location: [facility];  Service: Gynecology;  Laterality: N/A;   OPERATIVE ULTRASOUND N/A 11/16/2019    Procedure: OPERATIVE ULTRASOUND;  Surgeon: Thurnell Lose, MD;  Location: [facility];  Service: Gynecology;  Laterality: N/A;    Social History         Socioeconomic History   Marital status: Married      Spouse name: Not on file   Number of children: Not on file   Years of education: Not on file   Highest education level: Not on file  Occupational History   Not on file  Tobacco Use   Smoking status: Former      Years: 2.00      Types: Cigarettes      Quit date: 12/16/2011      Years since quitting: 10.7   Smokeless tobacco: Never  Vaping Use    Vaping Use: Never used  Substance and Sexual Activity   Alcohol use: Yes      Comment: occassionally   Drug use: No   Sexual activity: Not Currently      Birth control/protection: None  Other Topics Concern   Not on file  Social History Narrative   Not on file    Social Determinants of Health    Financial Resource Strain: Not on file  Food Insecurity: Not on file  Transportation Needs: Not on file  Physical Activity: Not on file  Stress: Not on file  Social Connections: Not on file  Intimate Partner Violence: Not on file         Family History  Problem Relation Age of Onset   Hypertension Mother     Hypertension Father     Cancer Father     Healthy Brother     Healthy Brother     Hypertension Maternal Aunt     Hypertension Maternal Uncle     Cancer Maternal Grandmother     Hypertension Maternal Grandmother     Hypertension Maternal Grandfather     Hypertension Paternal Grandmother     Stroke Paternal Grandmother     Hypertension Paternal Grandfather     Stroke Paternal Grandfather      No current facility-administered medications on file prior to encounter.          Current Outpatient Medications on File Prior to Encounter  Medication Sig Dispense Refill   Adalimumab (HUMIRA PEN) 40 MG/0.4ML PNKT Inject 40 mg into the skin every 14 (fourteen) days. First dose in clinic 05/30/21 6 each 0   metroNIDAZOLE (FLAGYL) 500 MG tablet Take 1 tablet (500 mg total) by mouth 2 (two) times daily. 14 tablet 0   NIFEdipine (PROCARDIA-XL/NIFEDICAL-XL) 30 MG 24 hr tablet Take 1 tablet (30 mg total) by mouth daily. 30 tablet 11    No Known Allergies   I have reviewed patient's Past Medical Hx, Surgical Hx, Family Hx, Social Hx, medications and allergies.    Review of Systems  Constitutional:  Negative for chills and fever.  Gastrointestinal:  Negative for abdominal pain, constipation, diarrhea, nausea and vomiting.  Genitourinary:  Negative for dysuria, frequency, hematuria,  pelvic pain, vaginal bleeding and vaginal discharge.  Positive leaking clear fluid      OBJECTIVE Patient Vitals for the past 24 hrs:   BP Temp Temp src Pulse Resp SpO2 Height Weight  08/31/22 1636 138/84 98.4 F (36.9 C) Oral 81 18 100 % -- --  08/31/22 1418 (!) 140/96 -- -- 92 -- 100 % -- --  08/31/22 1409 (!) 155/97 98 F (36.7 C) Oral (!) 101 18 100 % -- --  08/31/22 1401 -- -- -- -- -- -- '5\' 3"'$  (1.6 m) 76 kg    Constitutional: Well-developed, well-nourished female in no acute distress.  Cardiovascular: normal rate Respiratory: normal rate and effort.  GI: Abd soft, non-tender, gravid appropriate for gestational age.  MS: Extremities nontender, no edema, normal ROM Neurologic: Alert and oriented x 4.  GU:      SPECULUM EXAM: NEFG, moderate amount of clear, odorless, fluid, leaking from cervix, physiologic discharge, no blood noted, cervix clean, visually closed, multiparous-appearing.              BIMANUAL: deferred   FHR 167 LAB RESULTS Lab Results Last 24 Hours       Results for orders placed or performed during the hospital encounter of 08/31/22 (from the past 24 hour(s))  Wet prep, genital     Status: None    Collection Time: 08/31/22  2:46 PM    Specimen: Cervix  Result Value Ref Range    Yeast Wet Prep HPF POC NONE SEEN NONE SEEN    Trich, Wet Prep NONE SEEN NONE SEEN    Clue Cells Wet Prep HPF POC NONE SEEN NONE SEEN    WBC, Wet Prep HPF POC <10 <10    Sperm NONE SEEN    Fern Test     Status: None    Collection Time: 08/31/22  2:59 PM  Result Value Ref Range    POCT Fern Test Positive = ruptured amniotic membanes          IMAGING  Imaging Results  [place] MFM OB Limited   Result Date: 08/31/2022 ----------------------------------------------------------------------  OBSTETRICS REPORT                       (Signed Final 08/31/2022 04:31 pm) ---------------------------------------------------------------------- Patient Info  ID #:       [id number]                           D.O.B.:  [birth year] (36 yrs)  Name:       Kirsten Burows LATRICE                 Visit Date: 08/31/2022 03:20 pm              Long ---------------------------------------------------------------------- Performed By  Attending:        Johnell Comings MD         Secondary Phy.:    Manya Silvas                                                              CNM  Performed By:     Georgie Chard        Address:           [address]  [place], [place]  Referred By:      [facility] MAU/Triage         [facility] ---------------------------------------------------------------------- Orders  #  Description                           Code        Ordered By  1  [place] MFM OB LIMITED                     [phone]    VIRGINIA SMITH ----------------------------------------------------------------------  #  Order #                     Accession #                Episode #  1  [id number]                   [id number]                 [id number] ---------------------------------------------------------------------- Indications  Premature rupture of membranes - leaking        O42.90  fluid  Oligohydramnios / Decreased amniotic fluid      O41.00X0  volume  Advanced maternal age multigravida 55+,         O29.522  second trimester  [redacted] weeks gestation of pregnancy                 Z3A.15 ---------------------------------------------------------------------- Fetal Evaluation  Num Of Fetuses:          1  Fetal Heart Rate(bpm):   171  Cardiac Activity:        Observed  Presentation:            Cephalic  Placenta:                Posterior  P. Cord Insertion:       Not well visualized  Amniotic Fluid  AFI FV:      Anhydramnios                              Largest Pocket(cm)  0  ---------------------------------------------------------------------- OB History  Gravidity:    6         Term:   3        Prem:   0        SAB:   1  TOP:          0       Ectopic:  0        Living: 3 ---------------------------------------------------------------------- Gestational Age  LMP:           17w 2d        Date:  05/02/22                  EDD:   02/06/23  Best:          15w 5d     Det. By:  Previous Ultrasound      EDD:   02/17/23                                      (07/02/22) ---------------------------------------------------------------------- Cervix Uterus Adnexa  Cervix  Length:           3.29  cm.  Measured Transabdominally  Uterus  No abnormality visualized.  Right Ovary  Not visualized.  Left Ovary  Not visualized.  Cul De Sac  No free fluid seen.  Adnexa  No abnormality visualized. ---------------------------------------------------------------------- Myomas  Site                     L(cm)      W(cm)       D(cm)      Location  Left LUS                 3.25       3.69        3.09 ----------------------------------------------------------------------  Blood Flow                  RI       PI       Comments ---------------------------------------------------------------------- Comments  This patient presented to the MAU due to leakage of fluid at  15+ weeks.  On today's exam, anhydramnios is noted, indicating that she  most likely has ruptured membranes.  Her ferning test in the  MAU was positive.  A fetal heartbeat is still present.  The fetus is in the vertex presentation.  A 3 to 4 cm left-sided fibroid is noted.  Her cervical length measured transabdominally was 3.29 cm  long without any signs of funneling.  The patient should be admitted for observation to determine if  she will go into spontaneous labor.  As prolonged previable PPROM at 15+ weeks is a condition  that is not compatible with life and as prolonged PPROM may  increase the maternal risk of infection, sepsis, and death, an  induction  should be considered if she remains undelivered  and the patient is willing to undergo an induction. ----------------------------------------------------------------------                   Johnell Comings, MD Electronically Signed Final Report   08/31/2022 04:31 pm ----------------------------------------------------------------------      MAU COURSE    Orders Placed This Encounter  Procedures   Wet prep, genital   [place] MFM OB Limited   Bed rest   Fern Test    No orders of the defined types were placed in  this encounter.     MDM Discussed [place] and pelvic exam results with D.r Annamaria Boots, MFM. Due to anhydramnios secondary to previable PPROM, an IUFD will most likely result with continued expectant management. As prolonging pregnancy following previable PPROM may increase her risk of infection, sepsis, and maternal death, an induction of labor is recommended. Discussed Hx, labs, exam w/ Dr. Elgie Congo. Agrees w/ POC. Will come talk to pt. CNM discussed recommendation      ASSESSMENT 1. Preterm premature rupture of membranes, antepartum   2. [redacted] weeks gestation of pregnancy   Pre-viable PPROM CHTN   PLAN Admit to [facility] Dr. Annamaria Boots recommends inducing labor to avoid risk of infection. May observe for 24 hours to see if she goes in to labor spontaneously and then induce if she does not Dr. Elgie Congo discussing POC with pt.  HTN Tx per Dr. Elgie Congo.   Tamala Julian, [place], [place] 08/31/2022  6:17 PM

## 2022-09-01 NOTE — Anesthesia Procedure Notes (Signed)
Procedure Name: Intubation Date/Time: 09/01/2022 6:58 AM  Performed by: Sammie Bench, CRNAPre-anesthesia Checklist: Patient identified, Emergency Drugs available, Suction available and Patient being monitored Patient Re-evaluated:Patient Re-evaluated prior to induction Oxygen Delivery Method: Circle System Utilized Preoxygenation: Pre-oxygenation with 100% oxygen Induction Type: IV induction Ventilation: Mask ventilation without difficulty Laryngoscope Size: Mac, 3 and Glidescope Tube type: Oral Number of attempts: 3 (x1 per SRNA with MAC 3, x1 per CRNA MAC 3, x1 per CRNA glidescope) Airway Equipment and Method: Stylet and Oral airway Placement Confirmation: ETT inserted through vocal cords under direct vision, positive ETCO2 and breath sounds checked- equal and bilateral Secured at: 21 cm Tube secured with: Tape Dental Injury: Teeth and Oropharynx as per pre-operative assessment

## 2022-09-01 NOTE — Discharge Summary (Signed)
Physician Discharge Summary  Patient ID: Memphis Creswell MRN: [id number] DOB/AGE: 1986-08-02 36 y.o.  Admit date: 08/31/2022 Discharge date: 09/01/2022  Admission Diagnoses:  Discharge Diagnoses:  Principal Problem:   History of preterm premature rupture of membranes (PPROM) Active Problems:   AMA (advanced maternal age) multigravida 6+, second trimester   Acute blood loss anemia   Retained portions of placenta   Intrauterine synechiae   Discharged Condition: {condition:18240}  Hospital Course: ***  Consults: {consultation:18241}  Significant Diagnostic Studies: {diagnostics:18242}  Treatments: {Tx:18249}  Discharge Exam: Blood pressure 112/72, pulse 88, temperature 98.1 F (36.7 C), temperature source Oral, resp. rate 17, height '5\' 3"'$  (1.6 m), weight 76 kg, last menstrual period 05/02/2022, SpO2 98 %, unknown if currently breastfeeding. {physical EQAS:3419622}  Disposition: Discharge disposition: 01-Home or Self Care       Discharge Instructions     Discharge patient   Complete by: As directed    Discharge disposition: 01-Home or Self Care   Discharge patient date: 09/01/2022      Allergies as of 09/01/2022   No Known Allergies      Medication List     TAKE these medications    Humira Pen 40 MG/0.4ML Pnkt Generic drug: Adalimumab Inject 40 mg into the skin every 14 (fourteen) days. First dose in clinic 05/30/21   ibuprofen 600 MG tablet Commonly known as: ADVIL Take 1 tablet (600 mg total) by mouth every 6 (six) hours as needed.   metroNIDAZOLE 500 MG tablet Commonly known as: FLAGYL Take 1 tablet (500 mg total) by mouth 2 (two) times daily.   multivitamin tablet Take 1 tablet by mouth daily.   NIFEdipine 30 MG 24 hr tablet Commonly known as: PROCARDIA-XL/NIFEDICAL-XL Take 1 tablet (30 mg total) by mouth daily.        Follow-up Information     [facility]. Go in 1 week(s).   Why: they will call you for your  follow up appointment in 7-10 days Contact information: [place] [address] [phone]                Signed: Aletha Halim 09/01/2022, 3:26 PM

## 2022-09-01 NOTE — Op Note (Signed)
Preoperative diagnosis: Retained adherent placenta after 16-week loss  Postoperative diagnosis: Same as above plus intrauterine synechiae(likely as a result of a D&C for retained placenta May 17-week loss January 2021) which I believe is responsible for the adherent placenta  Procedure: Suction and sharp uterine curettage for removal of retained and adherent post fetal loss placenta  Surgeon:  Florian Buff, MD  Anesthesia: General endotracheal  Findings: The patient experienced fetal loss approximately 5 to 6 hours prior to coming to surgery She passed the fetus for retained placenta He received additional Cytotec doses after passage of the fetus to try to affect the delivery of the placenta I went in on 2 occasions to try to help manually remove the placenta as the patient pushed down but it was very adherent The second time which was just prior to the decision to come to surgery, I placed a speculum and use ring forceps could not tease the placenta out even though the cervix was quite dilated.  It felt not just retained but adherent As a result I decided to proceed with operative management  Description of operation: [facility] She was placed in the supine position underwent general endotracheal anesthesia She was placed in dorsolithotomy position She was prepped and draped in the usual sterile fashion A open sided Graves speculum was used The cervix was grasped with a single-tooth tenaculum A #12 was suction curette was used Several passes were made and I was getting tissue back but it was as much as I thought was there So I took the speculum out into the single-tooth tenaculum out and did a manual evaluation of the endometrial cavity and she had obvious the uterine synechiae which I was able to break up manually After I did this a significant amount of the placenta came spontaneously think the placenta was actually called  up and adherent to this complex of intrauterine synechiae It is noted that the patient had to have a D&C for a retained placenta several days after a 17-week pregnancy loss January 2021 It is likely that the intrauterine synechiae formed after this, and the healing process I made a cut another couple of passes with the suction curette and I felt like the remaining placental tissue was removed Then used a curette and got good uterine cry in all areas 1 more pass with the suction curette and I felt comfortable that all of the tissue had been removed Patient was given 0.2 of Methergine IM Was taken to recovery in good stable condition all counts were correct All told she experienced about 500 cc of blood loss A hemoglobin hematocrit will be performed 4 hours from the end of the surgery because the patient has significant anemia preoperatively She  understands it is likely going to be necessary to transfuse  Florian Buff, MD 09/01/2022 9:04 AM

## 2022-09-01 NOTE — Progress Notes (Signed)
Assisted to bathroom felt pressure and thought she passed placenta and became dizzy and passed out. Aroused and assisted to bed on steady. Vitals were stable did not pass placenta Dr.Eure notified that pt had passed a large blood clot and passed out and he asked to come to bedside stated to give 1000cc of fluid bolus.stated he was on the way.

## 2022-09-01 NOTE — Anesthesia Preprocedure Evaluation (Addendum)
Anesthesia Evaluation  Patient identified by MRN, date of birth, ID band Patient awake    Reviewed: Allergy & Precautions, NPO status , Patient's Chart, lab work & pertinent test results  Airway Mallampati: II  TM Distance: >3 FB Neck ROM: Full    Dental no notable dental hx. (+) Dental Advisory Given   Pulmonary neg pulmonary ROS, former smoker   Pulmonary exam normal breath sounds clear to auscultation       Cardiovascular hypertension, Normal cardiovascular exam Rhythm:Regular Rate:Normal     Neuro/Psych  Headaches    GI/Hepatic negative GI ROS, Neg liver ROS,,,  Endo/Other  negative endocrine ROS    Renal/GU negative Renal ROS     Musculoskeletal  (+) Arthritis ,    Abdominal   Peds  Hematology  (+) Blood dyscrasia, anemia Lab Results      Component                Value               Date                      WBC                      12.1 (H)            08/31/2022                HGB                      8.0 (L)             08/31/2022                HCT                      22.9 (L)            08/31/2022                MCV                      89.8                08/31/2022                PLT                      436 (H)             08/31/2022              Anesthesia Other Findings   Reproductive/Obstetrics                             Anesthesia Physical Anesthesia Plan  ASA: 3 and emergent  Anesthesia Plan:    Post-op Pain Management:    Induction: Intravenous and Cricoid pressure planned  PONV Risk Score and Plan: 4 or greater and Treatment may vary due to age or medical condition, Ondansetron, Midazolam and Dexamethasone  Airway Management Planned: Oral ETT  Additional Equipment: None  Intra-op Plan:   Post-operative Plan: Extubation in OR  Informed Consent: I have reviewed the patients History and Physical, chart, labs and discussed the procedure including the risks,  benefits and alternatives for the proposed anesthesia with the patient or authorized representative who has indicated his/her  understanding and acceptance.     Dental advisory given  Plan Discussed with: CRNA, Anesthesiologist and Surgeon  Anesthesia Plan Comments:        Anesthesia Quick Evaluation

## 2022-09-02 ENCOUNTER — Encounter (HOSPITAL_COMMUNITY): Payer: Self-pay | Admitting: Obstetrics & Gynecology

## 2022-09-02 LAB — TOXOPLASMA GONDII ANTIBODY, IGM: Toxoplasma Antibody- IgM: <3 AU/mL (ref 0.0–7.9)

## 2022-09-02 LAB — INFECT DISEASE AB IGM REFLEX 1

## 2022-09-02 LAB — RUBELLA SCREEN: Rubella: <0.9 index — ABNORMAL LOW (ref >0.99)

## 2022-09-02 LAB — RUBELLA ANTIBODY, IGM: Rubella IgM: <20 AU/mL (ref 0.0–19.9)

## 2022-09-03 LAB — SURGICAL PATHOLOGY

## 2022-09-03 NOTE — Anesthesia Postprocedure Evaluation (Signed)
Anesthesia Post Note  Patient: Kirsten Long  Procedure(s) Performed: DILATATION AND EVACUATION     Patient location during evaluation: PACU Anesthesia Type: General Level of consciousness: awake and sedated Pain management: pain level controlled Vital Signs Assessment: post-procedure vital signs reviewed and stable Respiratory status: spontaneous breathing Cardiovascular status: stable Postop Assessment: no apparent nausea or vomiting Anesthetic complications: no   No notable events documented.  Last Vitals:  Vitals:   09/01/22 1005 09/01/22 1114  BP: 117/76 112/72  Pulse: 88   Resp: 16 17  Temp: 36.8 C 36.7 C  SpO2: 98% 98%    Last Pain:  Vitals:   09/01/22 1500  TempSrc:   PainSc: 1    Pain Goal: Patients Stated Pain Goal: 3 (09/01/22 0148)                 Huston Foley

## 2022-09-04 LAB — CMV DNA BY PCR, QUALITATIVE: CMV DNA, Qual PCR: NEGATIVE

## 2022-09-17 ENCOUNTER — Ambulatory Visit (INDEPENDENT_AMBULATORY_CARE_PROVIDER_SITE_OTHER): Payer: BC Managed Care – PPO | Admitting: Licensed Clinical Social Worker

## 2022-09-17 DIAGNOSIS — Z7189 Other specified counseling: Secondary | ICD-10-CM | POA: Diagnosis not present

## 2022-09-22 NOTE — BH Specialist Note (Signed)
Integrated Behavioral Health via Telemedicine Visit  09/22/2022 Kirsten Long [id number]  Number of [place] Clinician visits: 1 Session Start time:  1:00pm Session End time: 1:21pm Total time in minutes: 21 mins via mychart video   Referring Provider: Dr.Pickens Patient/Family location: Home  Kirsten Long Provider location: [place]  All persons participating in visit: Kirsten Long and Kirsten Long  Types of Service: Individual psychotherapy and Video visit  I connected with Kirsten Long and/or Kirsten Long via  Animal nutritionist  (Video is Tree surgeon) and verified that I am speaking with the correct person using two identifiers. Discussed confidentiality: Yes   I discussed the limitations of telemedicine and the availability of in person appointments.  Discussed there is a possibility of technology failure and discussed alternative modes of communication if that failure occurs.  I discussed that engaging in this telemedicine visit, they consent to the provision of behavioral healthcare and the Long will be billed under their insurance.  Patient and/or legal guardian expressed understanding and consented to Telemedicine visit: Yes   Presenting Concerns: Patient and/or family reports the following symptoms/concerns: pregnancy loss Duration of problem: approx 3 weeks ; Severity of problem: mild  Patient and/or Family's Strengths/Protective Factors: Concrete supports in place (healthy food, safe environments, etc.)  Goals Addressed: Patient will:  Reduce symptoms of: depression and stress   Increase knowledge and/or ability of: coping skills and stress reduction   Demonstrate ability to: Increase healthy adjustment to current life circumstances  Progress towards Goals: Ongoing  Interventions: Interventions utilized:  Motivational Interviewing and  Supportive Counseling Standardized Assessments completed: Long  Patient and/or Family Response: Kirsten Long responded well to mychart visit. Kirsten Long reports inability to properly grieve due to parenting responsibilities. Kirsten Long encourage Ms. Long to begin journal writing, support group and recognize her emotions presently.   Assessment: Patient currently experiencing grieve pregnancy loss.   Patient may benefit from integrated behavioral health.  Plan: Follow up with behavioral health clinician on : 10/15/2022 Behavioral recommendations: Prioritize rest, concern support group, communicate needs with supportive individual for added support  Referral(s): [place] (In Clinic)  I discussed the assessment and treatment plan with the patient and/or parent/guardian. They were provided an opportunity to ask questions and all were answered. They agreed with the plan and demonstrated an understanding of the instructions.   They were advised to call back or seek an in-person evaluation if the symptoms worsen or if the condition fails to improve as anticipated.  Kirsten Ferrier, Kirsten

## 2022-10-15 ENCOUNTER — Ambulatory Visit: Payer: BC Managed Care – PPO | Admitting: Licensed Clinical Social Worker

## 2022-10-15 ENCOUNTER — Encounter: Payer: Self-pay | Admitting: Obstetrics

## 2022-10-15 ENCOUNTER — Ambulatory Visit (INDEPENDENT_AMBULATORY_CARE_PROVIDER_SITE_OTHER): Payer: BC Managed Care – PPO | Admitting: Obstetrics

## 2022-10-15 DIAGNOSIS — O021 Missed abortion: Secondary | ICD-10-CM | POA: Diagnosis not present

## 2022-10-15 DIAGNOSIS — Z3009 Encounter for other general counseling and advice on contraception: Secondary | ICD-10-CM | POA: Diagnosis not present

## 2022-10-15 DIAGNOSIS — I10 Essential (primary) hypertension: Secondary | ICD-10-CM | POA: Diagnosis not present

## 2022-10-15 DIAGNOSIS — Z7189 Other specified counseling: Secondary | ICD-10-CM

## 2022-10-15 NOTE — Progress Notes (Signed)
Post Partum Visit Note  Kirsten Long is a 36 y.o. [id number] female who presents for a postpartum visit. She is 6 weeks postpartum following a normal spontaneous vaginal delivery.  I have fully reviewed the prenatal and intrapartum course. The delivery was at 15 gestational weeks.  Anesthesia: none. Postpartum course has been good and bad days. Baby was IUFD. Bleeding no bleeding. Bowel function is normal. Bladder function is normal. Patient is not sexually active. Contraception method is none. Postpartum depression screening: positive.   The pregnancy intention screening data noted above was reviewed. Potential methods of contraception were discussed. The patient elected to proceed with No data recorded.   Edinburgh Postnatal Depression Scale - 10/15/22 1328       Edinburgh Postnatal Depression Scale:  In the Past 7 Days   I have been able to laugh and see the funny side of things. 1    I have looked forward with enjoyment to things. 1    I have blamed myself unnecessarily when things went wrong. 0    I have been anxious or worried for no good reason. 2    I have felt scared or panicky for no good reason. 0    Things have been getting on top of me. 2    I have been so unhappy that I have had difficulty sleeping. 2    I have felt sad or miserable. 2    I have been so unhappy that I have been crying. 1    The thought of harming myself has occurred to me. 0    Edinburgh Postnatal Depression Scale Total 11             Health Maintenance Due  Topic Date Due   COVID-19 Vaccine (3 - Pfizer risk series) 04/23/2020   PAP SMEAR-Modifier  02/09/2021   INFLUENZA VACCINE  Never done   DTaP/Tdap/Td (2 - Td or Tdap) 09/23/2022    The following portions of the patient's history were reviewed and updated as appropriate: allergies, current medications, past family history, past medical history, past social history, past surgical history, and problem list.  Review of  Systems A comprehensive review of systems was negative.  Objective:  BP (!) 145/101   Pulse 82   Ht '5\' 3"'$  (1.6 m)   Wt 164 lb (74.4 kg)   LMP 10/11/2022 (Exact Date)   Breastfeeding Unknown   BMI 29.05 kg/m    General:  alert and no distress   Breasts:  normal  Lungs: clear to auscultation bilaterally  Heart:  regular rate and rhythm, S1, S2 normal, no murmur, click, rub or gallop  Abdomen: soft, non-tender; bowel sounds normal; no masses,  no organomegaly   Wound none  GU exam:  not indicated       Assessment:    1. Postpartum care following vaginal delivery  2. IUFD at less than 20 weeks of gestation  3. Encounter for other general counseling or advice on contraception - wants tubal sterilization  4. HTN (hypertension), benign - taking Procardia XL 30 mg daily - referred to Internal Medicine for health maintenance    Plan:   Essential components of care per ACOG recommendations:  1.  Mood and well being: Patient with negative depression screening today. Reviewed local resources for support.  - Patient tobacco use? No.   - hx of drug use? No.    2. Infant care and feeding:  -Patient currently breastmilk feeding? No.  -Social determinants of health (  SDOH) reviewed in EPIC. No concerns  3. Sexuality, contraception and birth spacing - Patient does not want a pregnancy in the next year.  Desired family size is 3 children.  - Reviewed reproductive life planning. Reviewed contraceptive methods based on pt preferences and effectiveness.  Patient desired Female Sterilization today.   - Discussed birth spacing of 18 months  4. Sleep and fatigue -Encouraged family/partner/community support of 4 hrs of uninterrupted sleep to help with mood and fatigue  5. Physical Recovery  - Discussed patients delivery and complications. She describes her labor as bad. - Patient had a Vaginal problems after delivery including retained placenta . Patient had  no  laceration.  - Patient  has urinary incontinence? No. - Patient is safe to resume physical and sexual activity  6.  Health Maintenance - HM due items addressed Yes - Last pap smear  Diagnosis  Date Value Ref Range Status  02/09/2018   Final   NEGATIVE FOR INTRAEPITHELIAL LESIONS OR MALIGNANCY.  02/09/2018   Final   A LETTER WAS SENT TO THE PATIENT INFORMING HER OF THE ABOVE RESULTS.   Pap smear not done at today's visit.  -Breast Cancer screening indicated? No.   7. Chronic Disease/Pregnancy Condition follow up: Hypertension  - PCP follow up  Baltazar Najjar, [place] for [facility], [place], [facility] 10/15/2022

## 2022-10-18 NOTE — BH Specialist Note (Signed)
Integrated Behavioral Health Follow Up In-Person Visit  MRN: [id number] Name: Kirsten Long [facility]  Number of [place] Clinician visits: 2 Session Start time:  1:19am Session End time: 1:42am Total time in minutes: 23 mins in person at [facility]   Types of Service: Individual psychotherapy  Interpretor:No. Interpretor Name and Language: none  Subjective: Kirsten Long is a 36 y.o. female accompanied by n/a Patient was referred by Dr Jodi Mourning  for grieving pregnancy loss. Patient reports the following symptoms/concerns: depressed mood, loss of interest, and stress Duration of problem: approx 2 months; Severity of problem: mild  Objective: Mood: Good and Affect: Appropriate Risk of harm to self or others: No plan to harm self or others  Life Context: Family and Social: lives in [place]  School/Work: [employer]  Self-Care: n/a Life Changes: pregnancy loss  Patient and/or Family's Strengths/Protective Factors: Concrete supports in place (healthy food, safe environments, etc.)  Goals Addressed: Patient will:  Reduce symptoms of: depression and stress   Increase knowledge and/or ability of: coping skills and stress reduction   Demonstrate ability to: Increase adequate support systems for patient/family  Progress towards Goals: Ongoing  Interventions: Interventions utilized:  Supportive Counseling Standardized Assessments completed: Not Needed  Patient and/or Family Response: Ms. Kirsten Long reports slight improvement in mood. Ms. Kirsten Long reports occasional sadness and crying when thoughts of events occur.    Assessment: Patient currently experiencing grief associated with preganancy.   Patient may benefit from integrated behavioral health.  Plan: Follow up with behavioral health clinician on : as needed Behavioral recommendations: Prioritize rest, concern support group, communicate needs with supportive individual for added  support   Referral(s): [place] (In Clinic) "From scale of 1-10, how likely are you to follow plan?":    Lynnea Ferrier, LCSW

## 2022-11-04 ENCOUNTER — Institutional Professional Consult (permissible substitution): Payer: BC Managed Care – PPO | Admitting: Obstetrics and Gynecology

## 2023-07-31 ENCOUNTER — Ambulatory Visit: Payer: 59

## 2023-07-31 ENCOUNTER — Ambulatory Visit (INDEPENDENT_AMBULATORY_CARE_PROVIDER_SITE_OTHER): Payer: 59 | Admitting: Family Medicine

## 2023-07-31 ENCOUNTER — Encounter: Payer: Self-pay | Admitting: Family Medicine

## 2023-07-31 VITALS — BP 132/86 | HR 76 | Temp 98.5°F | Wt 167.6 lb

## 2023-07-31 DIAGNOSIS — R5383 Other fatigue: Secondary | ICD-10-CM

## 2023-07-31 DIAGNOSIS — Z8739 Personal history of other diseases of the musculoskeletal system and connective tissue: Secondary | ICD-10-CM

## 2023-07-31 DIAGNOSIS — R202 Paresthesia of skin: Secondary | ICD-10-CM | POA: Diagnosis not present

## 2023-07-31 DIAGNOSIS — Z862 Personal history of diseases of the blood and blood-forming organs and certain disorders involving the immune mechanism: Secondary | ICD-10-CM | POA: Diagnosis not present

## 2023-07-31 DIAGNOSIS — R0609 Other forms of dyspnea: Secondary | ICD-10-CM | POA: Diagnosis not present

## 2023-07-31 DIAGNOSIS — I10 Essential (primary) hypertension: Secondary | ICD-10-CM

## 2023-07-31 DIAGNOSIS — Z7689 Persons encountering health services in other specified circumstances: Secondary | ICD-10-CM

## 2023-07-31 LAB — CBC WITH DIFFERENTIAL/PLATELET
Basophils Absolute: 0 K/uL (ref 0.0–0.1)
Basophils Relative: 0.5 % (ref 0.0–3.0)
Eosinophils Absolute: 0.1 K/uL (ref 0.0–0.7)
Eosinophils Relative: 2.7 % (ref 0.0–5.0)
HCT: 38.9 % (ref 36.0–46.0)
Hemoglobin: 12.7 g/dL (ref 12.0–15.0)
Lymphocytes Relative: 30.8 % (ref 12.0–46.0)
Lymphs Abs: 1.7 K/uL (ref 0.7–4.0)
MCHC: 32.6 g/dL (ref 30.0–36.0)
MCV: 88 fl (ref 78.0–100.0)
Monocytes Absolute: 0.3 K/uL (ref 0.1–1.0)
Monocytes Relative: 5.6 % (ref 3.0–12.0)
Neutro Abs: 3.3 K/uL (ref 1.4–7.7)
Neutrophils Relative %: 60.4 % (ref 43.0–77.0)
Platelets: 474 K/uL — ABNORMAL HIGH (ref 150.0–400.0)
RBC: 4.42 Mil/uL (ref 3.87–5.11)
RDW: 16.5 % — ABNORMAL HIGH (ref 11.5–15.5)
WBC: 5.4 K/uL (ref 4.0–10.5)

## 2023-07-31 LAB — COMPREHENSIVE METABOLIC PANEL
ALT: 8 U/L (ref 0–35)
AST: 13 U/L (ref 0–37)
Albumin: 4.4 g/dL (ref 3.5–5.2)
Alkaline Phosphatase: 70 U/L (ref 39–117)
BUN: 7 mg/dL (ref 6–23)
CO2: 26 meq/L (ref 19–32)
Calcium: 9.7 mg/dL (ref 8.4–10.5)
Chloride: 105 meq/L (ref 96–112)
Creatinine, Ser: 0.87 mg/dL (ref 0.40–1.20)
GFR: 84.51 mL/min (ref >60.00)
Glucose, Bld: 93 mg/dL (ref 70–99)
Potassium: 4.1 meq/L (ref 3.5–5.1)
Sodium: 138 meq/L (ref 135–145)
Total Bilirubin: 0.5 mg/dL (ref 0.2–1.2)
Total Protein: 7.4 g/dL (ref 6.0–8.3)

## 2023-07-31 LAB — URINALYSIS, ROUTINE W REFLEX MICROSCOPIC
Bilirubin Urine: NEGATIVE
Ketones, ur: NEGATIVE
Leukocytes,Ua: NEGATIVE
Nitrite: NEGATIVE
Specific Gravity, Urine: 1.025 (ref 1.000–1.030)
Total Protein, Urine: NEGATIVE
Urine Glucose: NEGATIVE
Urobilinogen, UA: 0.2 (ref 0.0–1.0)
WBC, UA: NONE SEEN
pH: 6 (ref 5.0–8.0)

## 2023-07-31 LAB — LIPID PANEL
Cholesterol: 165 mg/dL (ref 0–200)
HDL: 49.2 mg/dL
LDL Cholesterol: 100 mg/dL — ABNORMAL HIGH (ref 0–99)
NonHDL: 115.61
Total CHOL/HDL Ratio: 3
Triglycerides: 76 mg/dL (ref 0.0–149.0)
VLDL: 15.2 mg/dL (ref 0.0–40.0)

## 2023-07-31 LAB — VITAMIN B12: Vitamin B-12: 158 pg/mL — ABNORMAL LOW (ref 211–911)

## 2023-07-31 LAB — BRAIN NATRIURETIC PEPTIDE: Pro B Natriuretic peptide (BNP): 14 pg/mL (ref 0.0–100.0)

## 2023-07-31 LAB — TSH: TSH: 1.1 u[IU]/mL (ref 0.35–5.50)

## 2023-07-31 LAB — FOLATE: Folate: 11.6 ng/mL

## 2023-07-31 LAB — MAGNESIUM: Magnesium: 2.1 mg/dL (ref 1.5–2.5)

## 2023-07-31 LAB — T4, FREE: Free T4: 0.77 ng/dL (ref 0.60–1.60)

## 2023-07-31 NOTE — Patient Instructions (Addendum)
Please go downstairs for labs and a urine test.   Call and schedule with your OB/GYN for breast and pelvic exams.   Call and schedule follow up with your rheumatologist.    Monitor your blood pressure at home.   Goal BP is 130/80 or lower.   Eat a low sodium diet.   Follow up with me in 4 weeks with your BP machine and readings.     How to Take Your Blood Pressure Blood pressure measures how strongly your blood is pressing against the walls of your arteries. Arteries are blood vessels that carry blood from your heart throughout your body. You can take your blood pressure at home with a machine. You may need to check your blood pressure at home: To check if you have high blood pressure (hypertension). To check your blood pressure over time. To make sure your blood pressure medicine is working. Supplies needed: Blood pressure machine, or monitor. A chair to sit in. This should be a chair where you can sit upright with your back supported. Do not sit on a soft couch or an armchair. Table or desk. Small notebook. Pencil or pen. How to prepare Avoid these things for 30 minutes before checking your blood pressure: Having drinks with caffeine in them, such as coffee or tea. Drinking alcohol. Eating. Smoking. Exercising. Do these things five minutes before checking your blood pressure: Go to the bathroom and pee (urinate). Sit in a chair. Be quiet. Do not talk. How to take your blood pressure Follow the instructions that came with your machine. If you have a digital blood pressure monitor, these may be the instructions: Sit up straight. Place your feet on the floor. Do not cross your ankles or legs. Rest your left arm at the level of your heart. You may rest it on a table, desk, or chair. Pull up your shirt sleeve. Wrap the blood pressure cuff around the upper part of your left arm. The cuff should be 1 inch (2.5 cm) above your elbow. It is best to wrap the cuff around bare  skin. Fit the cuff snugly around your arm, but not too tightly. You should be able to place only one finger between the cuff and your arm. Place the cord so that it rests in the bend of your elbow. Press the power button. Sit quietly while the cuff fills with air and loses air. Write down the numbers on the screen. Wait 2-3 minutes and then repeat steps 1-10. What do the numbers mean? Two numbers make up your blood pressure. The first number is called systolic pressure. The second is called diastolic pressure. An example of a blood pressure reading is "120 over 80" (or 120/80). If you are an adult and do not have a medical condition, use this guide to find out if your blood pressure is normal: Normal First number: below 120. Second number: below 80. Elevated First number: 120-129. Second number: below 80. Hypertension stage 1 First number: 130-139. Second number: 80-89. Hypertension stage 2 First number: 140 or above. Second number: 90 or above. Your blood pressure is above normal even if only the first or only the second number is above normal. Follow these instructions at home: Medicines Take over-the-counter and prescription medicines only as told by your doctor. Tell your doctor if your medicine is causing side effects. General instructions Check your blood pressure as often as your doctor tells you to. Check your blood pressure at the same time every day. Take your  monitor to your next doctor's appointment. Your doctor will: Make sure you are using it correctly. Make sure it is working right. Understand what your blood pressure numbers should be. Keep all follow-up visits. General tips You will need a blood pressure machine or monitor. Your doctor can suggest a monitor. You can buy one at a drugstore or online. When choosing one: Choose one with an arm cuff. Choose one that wraps around your upper arm. Only one finger should fit between your arm and the cuff. Do not choose  one that measures your blood pressure from your wrist or finger. Where to find more information American Heart Association: www.heart.org Contact a doctor if: Your blood pressure keeps being high. Your blood pressure is suddenly low. Get help right away if: Your first blood pressure number is higher than 180. Your second blood pressure number is higher than 120. These symptoms may be an emergency. Do not wait to see if the symptoms will go away. Get help right away. Call 911. Summary Check your blood pressure at the same time every day. Avoid caffeine, alcohol, smoking, and exercise for 30 minutes before checking your blood pressure. Make sure you understand what your blood pressure numbers should be. This information is not intended to replace advice given to you by your health care provider. Make sure you discuss any questions you have with your health care provider. Document Revised: 06/20/2021 Document Reviewed: 06/20/2021 Elsevier Patient Education  2024 [employer].

## 2023-07-31 NOTE — Progress Notes (Signed)
Please schedule her for B12 injections if she is willing. "Your vitamin B12 is quite low. I recommend getting vitamin B12 injections weekly x 4 weeks in our office. I also recommend starting on an over the counter multi-vitamin".  I am still waiting on some of her labs results.

## 2023-07-31 NOTE — Assessment & Plan Note (Signed)
Monitor BP at home. Follow up here with BP readings and machine in 4 weeks. Consider starting medication if not controlled. DASH diet handout provided.  Check labs to look for complications.

## 2023-07-31 NOTE — Assessment & Plan Note (Signed)
Check B12, thyroid and other labs.

## 2023-07-31 NOTE — Assessment & Plan Note (Signed)
Look for underlying etiology

## 2023-07-31 NOTE — Assessment & Plan Note (Signed)
Follow up with rheumatologist

## 2023-07-31 NOTE — Progress Notes (Signed)
New Patient Office Visit  Subjective    Patient ID: Kirsten Long, female    DOB: September 14, 1986  Age: 37 y.o. MRN: [id number]  CC:  Chief Complaint  Patient presents with   Establish Care    HPI Kirsten Long presents to establish care No PCP in years.  Her mother is with her today.   HTN- diagnosed in 2020  She has taken medication but not very often. She has not been on medication recently.   C/o DOE. She does not exercise. Is not sure if breathing is due to being out of shape.  Denies chest pain, palpitations, edema.   Hx of miscarriages.   Hx of iron deficiency.   Intermittent tingling in feet.   Hx of ankylosing spondylitis - prescribed  Rheumatologist had her on Humira. She has been off of it for 1-2 years. She has not followed up with her rheumatologist.   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   Currently does not have back pain.   Works in Clinical biochemist  3 children and married.  Smokes Black and Mild- 1 daily.  Occ alcohol   Husband had vasectomy    Outpatient Encounter Medications as of 07/31/2023  Medication Sig   NIFEdipine (PROCARDIA-XL/NIFEDICAL-XL) 30 MG 24 hr tablet Take 1 tablet (30 mg total) by mouth daily.   [DISCONTINUED] Adalimumab (HUMIRA PEN) 40 MG/0.4ML PNKT Inject 40 mg into the skin every 14 (fourteen) days. First dose in clinic 05/30/21 (Patient not taking: Reported on 10/15/2022)   [DISCONTINUED] ibuprofen (ADVIL) 600 MG tablet Take 1 tablet (600 mg total) by mouth every 6 (six) hours as needed. (Patient not taking: Reported on 10/15/2022)   [DISCONTINUED] metroNIDAZOLE (FLAGYL) 500 MG tablet Take 1 tablet (500 mg total) by mouth 2 (two) times daily. (Patient not taking: Reported on 07/31/2023)   [DISCONTINUED] Multiple Vitamins-Minerals (MULTIVITAMIN) tablet Take 1 tablet by mouth daily. (Patient not taking: Reported on 07/31/2023)   No facility-administered encounter medications  on file as of 07/31/2023.    Past Medical History:  Diagnosis Date   Anemia    Anxiety    Arthritis    Depression    HSV (herpes simplex virus) infection    Hypertension    Infection    Valtrex for HSV 2009    Urinary tract infection     Past Surgical History:  Procedure Laterality Date   CHOLECYSTECTOMY  10/15/2012   Procedure: LAPAROSCOPIC CHOLECYSTECTOMY WITH INTRAOPERATIVE CHOLANGIOGRAM;  Surgeon: Kandis Cocking, MD;  Location: [facility];  Service: General;  Laterality: N/A;   DILATION AND EVACUATION N/A 11/16/2019   Procedure: DILATATION AND EVACUATION;  Surgeon: [name], MD;  Location: [facility];  Service: Gynecology;  Laterality: N/A;   DILATION AND EVACUATION N/A 09/01/2022   Procedure: DILATATION AND EVACUATION;  Surgeon: Lazaro Arms, MD;  Location: [facility];  Service: Gynecology;  Laterality: N/A;   HERNIA REPAIR     OPERATIVE ULTRASOUND N/A 11/16/2019   Procedure: OPERATIVE ULTRASOUND;  Surgeon: [name], MD;  Location[facility];  Service: Gynecology;  Laterality: N/A;    Family History  Problem Relation Age of Onset   Hypertension Mother    Hypertension Father    Cancer Father    Healthy Brother    Healthy Brother    Hypertension Maternal Aunt    Hypertension Maternal Uncle    Cancer Maternal Grandmother    Hypertension Maternal Grandmother    Hypertension  Maternal Grandfather    Hypertension Paternal Grandmother    Stroke Paternal Grandmother    Hypertension Paternal Grandfather    Stroke Paternal Grandfather     Social History   Socioeconomic History   Marital status: Married    Spouse name: Not on file   Number of children: Not on file   Years of education: Not on file   Highest education level: Some college, no degree  Occupational History   Not on file  Tobacco Use   Smoking status: Some Days    Types: Cigars   Smokeless tobacco: Never  Vaping Use   Vaping status: Never Used  Substance  and Sexual Activity   Alcohol use: Not Currently    Comment: occassionally   Drug use: No   Sexual activity: Yes    Birth control/protection: None  Other Topics Concern   Not on file  Social History Narrative   Not on file   Social Determinants of Health   Financial Resource Strain: Low Risk  (07/30/2023)   Overall Financial Resource Strain (CARDIA)    Difficulty of Paying Living Expenses: Not very hard  Food Insecurity: No Food Insecurity (07/30/2023)   Hunger Vital Sign    Worried About Running Out of Food in the Last Year: Never true    Ran Out of Food in the Last Year: Never true  Transportation Needs: No Transportation Needs (07/30/2023)   PRAPARE - Administrator, Civil Service (Medical): No    Lack of Transportation (Non-Medical): No  Physical Activity: Unknown (07/30/2023)   Exercise Vital Sign    Days of Exercise per Week: Patient declined    Minutes of Exercise per Session: Not on file  Stress: Stress Concern Present (07/30/2023)   [employer] of Occupational Health - Occupational Stress Questionnaire    Feeling of Stress : To some extent  Social Connections: Moderately Integrated (07/30/2023)   Social Connection and Isolation Panel [NHANES]    Frequency of Communication with Friends and Family: More than three times a week    Frequency of Social Gatherings with Friends and Family: Once a week    Attends Religious Services: More than 4 times per year    Active Member of [employer] or Organizations: No    Attends Engineer, structural: Not on file    Marital Status: Married  Catering manager Violence: Not on file    Review of Systems  Constitutional:  Positive for malaise/fatigue. Negative for chills, fever and weight loss.  HENT:  Positive for tinnitus.   Eyes:  Negative for blurred vision and double vision.  Respiratory:  Positive for shortness of breath.   Cardiovascular:  Negative for chest pain, palpitations and leg swelling.   Gastrointestinal:  Negative for abdominal pain, constipation, diarrhea, nausea and vomiting.  Genitourinary:  Negative for dysuria, frequency and urgency.  Neurological:  Positive for dizziness and tingling. Negative for focal weakness.        Objective    BP 132/86 (BP Location: Left Arm, Patient Position: Sitting, Cuff Size: Large)   Pulse 76   Temp 98.5 F (36.9 C) (Oral)   Wt 167 lb 9.6 oz (76 kg)   SpO2 97%   BMI 29.69 kg/m   Physical Exam Constitutional:      General: She is not in acute distress.    Appearance: She is not ill-appearing.  Eyes:     Extraocular Movements: Extraocular movements intact.     Conjunctiva/sclera: Conjunctivae normal.  Cardiovascular:  Rate and Rhythm: Normal rate and regular rhythm.  Pulmonary:     Effort: Pulmonary effort is normal.     Breath sounds: Normal breath sounds.  Musculoskeletal:     Cervical back: Normal range of motion and neck supple.     Right lower leg: No edema.     Left lower leg: No edema.  Skin:    General: Skin is warm and dry.  Neurological:     General: No focal deficit present.     Mental Status: She is alert and oriented to person, place, and time.     Motor: No weakness.     Coordination: Coordination normal.     Gait: Gait normal.  Psychiatric:        Mood and Affect: Mood normal.        Behavior: Behavior normal.        Thought Content: Thought content normal.         Assessment & Plan:   Problem List Items Addressed This Visit       Cardiovascular and Mediastinum   Essential hypertension - Primary    Monitor BP at home. Follow up here with BP readings and machine in 4 weeks. Consider starting medication if not controlled. DASH diet handout provided.  Check labs to look for complications.       Relevant Orders   CBC with Differential/Platelet (Completed)   Comprehensive metabolic panel (Completed)   TSH (Completed)   T4, free (Completed)   Urinalysis, Routine w reflex microscopic  (Completed)   Lipid panel (Completed)     Other   DOE (dyspnea on exertion)    BNP, chest X ray ordered. Most likely deconditioning.       Relevant Orders   Brain natriuretic peptide (Completed)   DG Chest 2 View (Completed)   Fatigue    Look for underlying etiology      Relevant Orders   CBC with Differential/Platelet (Completed)   Comprehensive metabolic panel (Completed)   TSH (Completed)   T4, free (Completed)   Urinalysis, Routine w reflex microscopic (Completed)   Folate (Completed)   H/O ankylosing spondylitis    Follow up with rheumatologist.       Paresthesias    Check B12, thyroid and other labs.       Relevant Orders   Vitamin B12 (Completed)   Magnesium (Completed)   Folate (Completed)   Iron, TIBC and Ferritin Panel   Other Visit Diagnoses     Encounter to establish care       History of anemia       Relevant Orders   Vitamin B12 (Completed)   Folate (Completed)   Iron, TIBC and Ferritin Panel       Return in about 4 weeks (around 08/28/2023) for hypertension.   Hetty Blend, NP-C

## 2023-07-31 NOTE — Assessment & Plan Note (Signed)
BNP, chest X ray ordered. Most likely deconditioning.

## 2023-08-01 LAB — IRON,TIBC AND FERRITIN PANEL
%SAT: 13 % — ABNORMAL LOW (ref 16–45)
Ferritin: 10 ng/mL — ABNORMAL LOW (ref 16–154)
Iron: 56 ug/dL (ref 40–190)
TIBC: 429 ug/dL (ref 250–450)

## 2023-08-11 ENCOUNTER — Ambulatory Visit: Payer: 59

## 2023-08-13 ENCOUNTER — Ambulatory Visit (INDEPENDENT_AMBULATORY_CARE_PROVIDER_SITE_OTHER): Payer: 59 | Admitting: Radiology

## 2023-08-13 DIAGNOSIS — E538 Deficiency of other specified B group vitamins: Secondary | ICD-10-CM

## 2023-08-13 MED ORDER — CYANOCOBALAMIN 1000 MCG/ML IJ SOLN
1000.0000 ug | Freq: Once | INTRAMUSCULAR | Status: AC
  Administered 2023-08-13: 1000 ug via INTRAMUSCULAR

## 2023-08-13 NOTE — Progress Notes (Signed)
Patient is first B12 injection. Patient tolerated well with no complications

## 2023-08-20 ENCOUNTER — Ambulatory Visit (INDEPENDENT_AMBULATORY_CARE_PROVIDER_SITE_OTHER): Payer: 59

## 2023-08-20 DIAGNOSIS — E538 Deficiency of other specified B group vitamins: Secondary | ICD-10-CM | POA: Diagnosis not present

## 2023-08-20 MED ORDER — CYANOCOBALAMIN 1000 MCG/ML IJ SOLN
1000.0000 ug | Freq: Once | INTRAMUSCULAR | Status: AC
  Administered 2023-08-20: 1000 ug via INTRAMUSCULAR

## 2023-08-20 NOTE — Progress Notes (Signed)
After obtaining consent, and per orders of [facility] NP-C , injection of B12 given by Ferdie Ping. Patient instructed to report any adverse reaction to me immediately.

## 2023-08-27 ENCOUNTER — Ambulatory Visit: Payer: 59

## 2023-08-27 DIAGNOSIS — E538 Deficiency of other specified B group vitamins: Secondary | ICD-10-CM | POA: Diagnosis not present

## 2023-08-27 MED ORDER — CYANOCOBALAMIN 1000 MCG/ML IJ SOLN
1000.0000 ug | Freq: Once | INTRAMUSCULAR | Status: AC
  Administered 2023-08-27: 1000 ug via INTRAMUSCULAR

## 2023-08-27 NOTE — Progress Notes (Signed)
Patient visits today to receive her B12 vaccine. Patient was informed and tolerated well. Patient was notified to reach out to [place] if needed.  Patient scheduled for next B12 on 11/14@ 9:40a

## 2023-08-28 ENCOUNTER — Ambulatory Visit: Payer: 59 | Admitting: Family Medicine

## 2023-09-03 ENCOUNTER — Ambulatory Visit: Payer: 59

## 2023-09-16 ENCOUNTER — Other Ambulatory Visit: Payer: Self-pay

## 2023-09-16 ENCOUNTER — Emergency Department (HOSPITAL_BASED_OUTPATIENT_CLINIC_OR_DEPARTMENT_OTHER): Payer: 59 | Admitting: Radiology

## 2023-09-16 ENCOUNTER — Emergency Department (HOSPITAL_BASED_OUTPATIENT_CLINIC_OR_DEPARTMENT_OTHER)
Admission: EM | Admit: 2023-09-16 | Discharge: 2023-09-17 | Disposition: A | Discharge status: Home / Self Care | Payer: 59 | Attending: Emergency Medicine | Admitting: Emergency Medicine

## 2023-09-16 DIAGNOSIS — R1013 Epigastric pain: Secondary | ICD-10-CM | POA: Insufficient documentation

## 2023-09-16 DIAGNOSIS — I10 Essential (primary) hypertension: Secondary | ICD-10-CM | POA: Diagnosis not present

## 2023-09-16 LAB — URINALYSIS, ROUTINE W REFLEX MICROSCOPIC
Bilirubin Urine: NEGATIVE
Glucose, UA: NEGATIVE mg/dL
Hgb urine dipstick: NEGATIVE
Ketones, ur: NEGATIVE mg/dL
Leukocytes,Ua: NEGATIVE
Nitrite: NEGATIVE
Protein, ur: NEGATIVE mg/dL
Specific Gravity, Urine: <1.005 — ABNORMAL LOW (ref 1.005–1.030)
pH: 7.5 (ref 5.0–8.0)

## 2023-09-16 LAB — CBC
HCT: 35.8 % — ABNORMAL LOW (ref 36.0–46.0)
Hemoglobin: 12.2 g/dL (ref 12.0–15.0)
MCH: 29.5 pg (ref 26.0–34.0)
MCHC: 34.1 g/dL (ref 30.0–36.0)
MCV: 86.5 fL (ref 80.0–100.0)
Platelets: 410 10*3/uL — ABNORMAL HIGH (ref 150–400)
RBC: 4.14 MIL/uL (ref 3.87–5.11)
RDW: 14.8 % (ref 11.5–15.5)
WBC: 6.1 10*3/uL (ref 4.0–10.5)
nRBC: 0 % (ref 0.0–0.2)

## 2023-09-16 LAB — COMPREHENSIVE METABOLIC PANEL
ALT: 10 U/L (ref 0–44)
AST: 15 U/L (ref 15–41)
Albumin: 4.4 g/dL (ref 3.5–5.0)
Alkaline Phosphatase: 59 U/L (ref 38–126)
Anion gap: 6 (ref 5–15)
BUN: 6 mg/dL (ref 6–20)
CO2: 28 mmol/L (ref 22–32)
Calcium: 9.3 mg/dL (ref 8.9–10.3)
Chloride: 104 mmol/L (ref 98–111)
Creatinine, Ser: 0.68 mg/dL (ref 0.44–1.00)
GFR, Estimated: >60 mL/min (ref >60)
Glucose, Bld: 102 mg/dL — ABNORMAL HIGH (ref 70–99)
Potassium: 3.3 mmol/L — ABNORMAL LOW (ref 3.5–5.1)
Sodium: 138 mmol/L (ref 135–145)
Total Bilirubin: 0.4 mg/dL (ref <1.2)
Total Protein: 7.6 g/dL (ref 6.5–8.1)

## 2023-09-16 LAB — TROPONIN I (HIGH SENSITIVITY): Troponin I (High Sensitivity): 2 ng/L (ref <18)

## 2023-09-16 LAB — PREGNANCY, URINE: Preg Test, Ur: NEGATIVE

## 2023-09-16 LAB — LIPASE, BLOOD: Lipase: 15 U/L (ref 11–51)

## 2023-09-16 NOTE — ED Triage Notes (Signed)
Pt POV reporting upper abd pain since Monday that worsened tonight. Normal bowel movements, denies urinary sx, took pepto bismol w no improvement

## 2023-09-16 NOTE — Discharge Instructions (Signed)

## 2023-09-17 ENCOUNTER — Emergency Department (HOSPITAL_BASED_OUTPATIENT_CLINIC_OR_DEPARTMENT_OTHER): Payer: 59

## 2023-09-17 DIAGNOSIS — R1013 Epigastric pain: Secondary | ICD-10-CM | POA: Diagnosis not present

## 2023-09-17 MED ORDER — IOHEXOL 300 MG/ML  SOLN
100.0000 mL | Freq: Once | INTRAMUSCULAR | Status: AC | PRN
  Administered 2023-09-17: 80 mL via INTRAVENOUS

## 2023-09-17 NOTE — ED Provider Notes (Signed)
EMERGENCY DEPARTMENT AT [facility] ([place] Provider Note   CSN: [id number] Arrival date & time: 09/16/23  2211     History Chief Complaint  Patient presents with   Abdominal Pain    Kirsten Long is a 37 y.o. female.  37 year old female who presents ER today with epigastric pain.  She states that has been there for about a month but progressively worsening.  She states that recently it summing been there when she ambulates.  She states minute or 2 after starts walking she starts having the discomfort.  She states it feels like a gnawing cramping type pain and then it will persist during activity until she lays down.  When she lays down it goes away after about 3 to 4 minutes per.  Sit down does not seem to help it is much.  Never anything like this before.  Will have intermittent episodes of nausea but not necessarily associated with the discomfort.  No shortness of breath, fatigue, lightheadedness, diaphoresis.  When I note her blood pressure being high she states that it has been high for a while.  She used to be on antihypertensives but she lost her insurance so she has not been on them recently and she has a follow-up appointment with her PCP in a week.  Should be keeping track of her blood pressures and having her start some blood pressure medication at that time.   Abdominal Pain      Home Medications Prior to Admission medications   Not on File      Allergies    Patient has no known allergies.    Review of Systems   Review of Systems  Gastrointestinal:  Positive for abdominal pain.    Physical Exam Updated Vital Signs BP (!) 171/102   Pulse 71   Temp 98.2 F (36.8 C)   Resp 16   Ht 5\' 3"  (1.6 m)   Wt 74.8 kg   LMP 08/21/2023 (Exact Date)   SpO2 99%   BMI 29.23 kg/m  Physical Exam Vitals and nursing note reviewed.  Constitutional:      Appearance: She is well-developed.  HENT:     Head: Normocephalic and atraumatic.   Cardiovascular:     Rate and Rhythm: Normal rate and regular rhythm.  Pulmonary:     Effort: No respiratory distress.     Breath sounds: No stridor.  Abdominal:     General: There is no distension.     Tenderness: There is no abdominal tenderness.  Musculoskeletal:     Cervical back: Normal range of motion.  Neurological:     Mental Status: She is alert.     ED Results / Procedures / Treatments   Labs (all labs ordered are listed, but only abnormal results are displayed) Labs Reviewed  COMPREHENSIVE METABOLIC PANEL - Abnormal; Notable for the following components:      Result Value   Potassium 3.3 (*)    Glucose, Bld 102 (*)    All other components within normal limits  CBC - Abnormal; Notable for the following components:   HCT 35.8 (*)    Platelets 410 (*)    All other components within normal limits  URINALYSIS, ROUTINE W REFLEX MICROSCOPIC - Abnormal; Notable for the following components:   Color, Urine COLORLESS (*)    Specific Gravity, Urine <1.005 (*)    All other components within normal limits  LIPASE, BLOOD  PREGNANCY, URINE  TROPONIN I (HIGH SENSITIVITY)    EKG EKG  Interpretation Date/Time:  Wednesday September 16 2023 22:23:30 EST Ventricular Rate:  66 PR Interval:  154 QRS Duration:  90 QT Interval:  412 QTC Calculation: 431 R Axis:   34  Text Interpretation: Normal sinus rhythm Normal ECG When compared with ECG of 02-Aug-2022 16:50, T wave inversion now evident in Inferior leads Confirmed by Marily Memos [phone]) on 09/16/2023 10:59:39 PM  Radiology CT ABDOMEN PELVIS W CONTRAST  Result Date: 09/17/2023 CLINICAL DATA:  Upper abdominal pain and epigastric pain. EXAM: CT ABDOMEN AND PELVIS WITH CONTRAST TECHNIQUE: Multidetector CT imaging of the abdomen and pelvis was performed using the standard protocol following bolus administration of intravenous contrast. RADIATION DOSE REDUCTION: This exam was performed according to the departmental  dose-optimization program which includes automated exposure control, adjustment of the mA and/or kV according to patient size and/or use of iterative reconstruction technique. CONTRAST:  80mL OMNIPAQUE IOHEXOL 300 MG/ML  SOLN COMPARISON:  None Available. FINDINGS: Lower chest: No acute abnormality. Hepatobiliary: Unremarkable liver. Cholecystectomy. No biliary dilation. Pancreas: Unremarkable. Spleen: Unremarkable. Adrenals/Urinary Tract: Normal adrenal glands. No urinary calculi or hydronephrosis. Bladder is unremarkable. Stomach/Bowel: Normal caliber large and small bowel. No bowel wall thickening. The appendix is normal.Stomach is within normal limits. Vascular/Lymphatic: No significant vascular findings are present. No enlarged abdominal or pelvic lymph nodes. Reproductive: Unremarkable. Other: No free intraperitoneal fluid or air. Musculoskeletal: No acute fracture. IMPRESSION: No acute abnormality in the abdomen or pelvis. Electronically Signed   By: Minerva Fester M.D.   On: 09/17/2023 00:53   DG Chest 2 View  Result Date: 09/16/2023 CLINICAL DATA:  Epigastric pain EXAM: CHEST - 2 VIEW COMPARISON:  07/31/2023 FINDINGS: The heart size and mediastinal contours are within normal limits. Both lungs are clear. The visualized skeletal structures are unremarkable. IMPRESSION: No active cardiopulmonary disease. Electronically Signed   By: Jasmine Pang M.D.   On: 09/16/2023 23:47    Procedures Procedures    Medications Ordered in ED Medications  iohexol (OMNIPAQUE) 300 MG/ML solution 100 mL (80 mLs Intravenous Contrast Given 09/17/23 0035)    ED Course/ Medical Decision Making/ A&P                                 Medical Decision Making Amount and/or Complexity of Data Reviewed Labs: ordered. Radiology: ordered.  Risk Prescription drug management.   Patient with atypical presentation of symptoms.  Secondary to the exertional nature of him being in epigastric area cardiac workup most done  and negative.  Also consider possible arterial causes of CT scan done to evaluate for any evidence of ulcer, pancreatitis, duodenitis or possibly pick up embolization or something and this was negative as well.  Her labs are overall reassuring.  I do not feel this is a high concern for her life or organ threatening process at this time and is stable for discharge to continue following up with her  PCP next week as scheduled.  She will also continue to address her blood pressure at that time  Final Clinical Impression(s) / ED Diagnoses Final diagnoses:  Uncontrolled hypertension  Epigastric pain    Rx / DC Orders ED Discharge Orders          Ordered    CT ABDOMEN PELVIS W CONTRAST  Status:  Canceled        09/16/23 2324              Zigmond Trela, Barbara Cower, MD 09/17/23 2324

## 2024-02-22 ENCOUNTER — Encounter (HOSPITAL_BASED_OUTPATIENT_CLINIC_OR_DEPARTMENT_OTHER): Payer: Self-pay | Admitting: Emergency Medicine

## 2024-02-22 ENCOUNTER — Emergency Department (HOSPITAL_BASED_OUTPATIENT_CLINIC_OR_DEPARTMENT_OTHER)

## 2024-02-22 ENCOUNTER — Other Ambulatory Visit: Payer: Self-pay

## 2024-02-22 ENCOUNTER — Emergency Department (HOSPITAL_BASED_OUTPATIENT_CLINIC_OR_DEPARTMENT_OTHER)
Admission: EM | Admit: 2024-02-22 | Discharge: 2024-02-22 | Disposition: A | Discharge status: Home / Self Care | Attending: Emergency Medicine | Admitting: Emergency Medicine

## 2024-02-22 DIAGNOSIS — K760 Fatty (change of) liver, not elsewhere classified: Secondary | ICD-10-CM | POA: Insufficient documentation

## 2024-02-22 DIAGNOSIS — K529 Noninfective gastroenteritis and colitis, unspecified: Secondary | ICD-10-CM | POA: Diagnosis not present

## 2024-02-22 DIAGNOSIS — I10 Essential (primary) hypertension: Secondary | ICD-10-CM | POA: Diagnosis not present

## 2024-02-22 DIAGNOSIS — R109 Unspecified abdominal pain: Secondary | ICD-10-CM | POA: Diagnosis present

## 2024-02-22 LAB — CBC
HCT: 39.5 % (ref 36.0–46.0)
Hemoglobin: 13.1 g/dL (ref 12.0–15.0)
MCH: 29.8 pg (ref 26.0–34.0)
MCHC: 33.2 g/dL (ref 30.0–36.0)
MCV: 89.8 fL (ref 80.0–100.0)
Platelets: 487 10*3/uL — ABNORMAL HIGH (ref 150–400)
RBC: 4.4 MIL/uL (ref 3.87–5.11)
RDW: 13.2 % (ref 11.5–15.5)
WBC: 4.8 10*3/uL (ref 4.0–10.5)
nRBC: 0 % (ref 0.0–0.2)

## 2024-02-22 LAB — URINALYSIS, ROUTINE W REFLEX MICROSCOPIC
Bilirubin Urine: NEGATIVE
Glucose, UA: NEGATIVE mg/dL
Hgb urine dipstick: NEGATIVE
Ketones, ur: NEGATIVE mg/dL
Leukocytes,Ua: NEGATIVE
Nitrite: NEGATIVE
Specific Gravity, Urine: 1.017 (ref 1.005–1.030)
pH: 8 (ref 5.0–8.0)

## 2024-02-22 LAB — COMPREHENSIVE METABOLIC PANEL WITH GFR
ALT: 7 U/L (ref 0–44)
AST: 18 U/L (ref 15–41)
Albumin: 4.8 g/dL (ref 3.5–5.0)
Alkaline Phosphatase: 86 U/L (ref 38–126)
Anion gap: 11 (ref 5–15)
BUN: 7 mg/dL (ref 6–20)
CO2: 25 mmol/L (ref 22–32)
Calcium: 9.3 mg/dL (ref 8.9–10.3)
Chloride: 103 mmol/L (ref 98–111)
Creatinine, Ser: 0.87 mg/dL (ref 0.44–1.00)
GFR, Estimated: >60 mL/min (ref >60)
Glucose, Bld: 125 mg/dL — ABNORMAL HIGH (ref 70–99)
Potassium: 3.6 mmol/L (ref 3.5–5.1)
Sodium: 139 mmol/L (ref 135–145)
Total Bilirubin: 0.5 mg/dL (ref 0.0–1.2)
Total Protein: 8.2 g/dL — ABNORMAL HIGH (ref 6.5–8.1)

## 2024-02-22 LAB — PREGNANCY, URINE: Preg Test, Ur: NEGATIVE

## 2024-02-22 LAB — LIPASE, BLOOD: Lipase: 18 U/L (ref 11–51)

## 2024-02-22 MED ORDER — PANTOPRAZOLE SODIUM 40 MG PO TBEC: 40.0000 mg | DELAYED_RELEASE_TABLET | Freq: Every day | ORAL | 1 refills | Status: AC

## 2024-02-22 MED ORDER — ONDANSETRON HCL 4 MG/2ML IJ SOLN
4.0000 mg | Freq: Once | INTRAMUSCULAR | Status: AC
  Administered 2024-02-22: 4 mg via INTRAVENOUS
  Filled 2024-02-22: qty 2

## 2024-02-22 MED ORDER — SODIUM CHLORIDE 0.9 % IV BOLUS
1000.0000 mL | Freq: Once | INTRAVENOUS | Status: AC
  Administered 2024-02-22: 1000 mL via INTRAVENOUS

## 2024-02-22 MED ORDER — ONDANSETRON 4 MG PO TBDP: 4.0000 mg | ORAL_TABLET | Freq: Three times a day (TID) | ORAL | 0 refills | Status: AC | PRN

## 2024-02-22 MED ORDER — IOHEXOL 300 MG/ML  SOLN
100.0000 mL | Freq: Once | INTRAMUSCULAR | Status: AC | PRN
  Administered 2024-02-22: 100 mL via INTRAVENOUS

## 2024-02-22 MED ORDER — DICYCLOMINE HCL 10 MG/5ML PO SOLN: 10.0000 mg | Freq: Once | ORAL | Status: DC

## 2024-02-22 MED ORDER — DICYCLOMINE HCL 10 MG PO CAPS
10.0000 mg | ORAL_CAPSULE | Freq: Once | ORAL | Status: AC
  Administered 2024-02-22: 10 mg via ORAL
  Filled 2024-02-22: qty 1

## 2024-02-22 MED ORDER — ALUM & MAG HYDROXIDE-SIMETH 200-200-20 MG/5ML PO SUSP
30.0000 mL | Freq: Once | ORAL | Status: AC
Start: 2024-02-22 — End: 2024-02-22
  Administered 2024-02-22: 30 mL via ORAL
  Filled 2024-02-22: qty 30

## 2024-02-22 NOTE — Discharge Instructions (Signed)
 Please follow-up with your primary care in regards to CT finding of fatty liver disease for further management care.  For this episode of nausea vomiting and diarrhea please take Zofran  as needed for nausea or vomiting.  Drink lots of water and you can alternate Gatorade and Pedialyte.  You can try bland foods.  You can also take Protonix please take for 30 days as prescribed.

## 2024-02-22 NOTE — ED Notes (Signed)
Reviewed discharge instructions, medications, and home care with pt. Pt verbalized understanding and had no further questions. Pt exited ED without complications.

## 2024-02-22 NOTE — ED Provider Notes (Signed)
 [place] EMERGENCY DEPARTMENT AT [facility] Provider Note   CSN: [id number] Arrival date & time: 02/22/24  1548     History  Chief Complaint  Patient presents with   Abdominal Pain    Kirsten Long is a 38 y.o. female.  With past medical history of alkalizing spondylitis, hypertension, cholecystectomy in, hernia repair presenting to emergency room with complaint of abdominal pain associated with nausea and vomiting.  Patient reports 2 episode of vomiting.  Also had 2 episodes of loose stool.  There was no blood.  She is tolerating oral intake and currently drinking a ginger ale.  She reports she is primarily concerned because this epigastric pain is constant and will not go away.  Does not seem to be aggravated by eating or drinking.  She denies any fever.  Denies any chest pain, shortness of breath or cough.  No recent travel.  No recent antibiotic use.  No suspicious food intake.   Abdominal Pain      Home Medications Prior to Admission medications   Not on File      Allergies    Patient has no known allergies.    Review of Systems   Review of Systems  Gastrointestinal:  Positive for abdominal pain.    Physical Exam Updated Vital Signs BP (!) 157/97   Pulse 72   Temp 98.7 F (37.1 C) (Temporal)   Resp 18   LMP 02/19/2024   SpO2 98%  Physical Exam Vitals and nursing note reviewed.  Constitutional:      General: She is not in acute distress.    Appearance: She is not toxic-appearing.  HENT:     Head: Normocephalic and atraumatic.  Eyes:     General: No scleral icterus.    Conjunctiva/sclera: Conjunctivae normal.  Cardiovascular:     Rate and Rhythm: Normal rate and regular rhythm.     Pulses: Normal pulses.     Heart sounds: Normal heart sounds.  Pulmonary:     Effort: Pulmonary effort is normal. No respiratory distress.     Breath sounds: Normal breath sounds.  Abdominal:     General: Abdomen is flat. Bowel sounds are normal.  There is no distension.     Palpations: Abdomen is soft. There is no mass.     Tenderness: There is abdominal tenderness.     Comments: Patient is tender to palpation throughout abdomen.  No CVA tenderness.  Skin:    General: Skin is warm and dry.     Findings: No lesion.  Neurological:     General: No focal deficit present.     Mental Status: She is alert and oriented to person, place, and time. Mental status is at baseline.     ED Results / Procedures / Treatments   Labs (all labs ordered are listed, but only abnormal results are displayed) Labs Reviewed  COMPREHENSIVE METABOLIC PANEL WITH GFR - Abnormal; Notable for the following components:      Result Value   Glucose, Bld 125 (*)    Total Protein 8.2 (*)    All other components within normal limits  CBC - Abnormal; Notable for the following components:   Platelets 487 (*)    All other components within normal limits  URINALYSIS, ROUTINE W REFLEX MICROSCOPIC - Abnormal; Notable for the following components:   Protein, ur TRACE (*)    All other components within normal limits  LIPASE, BLOOD  PREGNANCY, URINE    EKG None  Radiology No results  found.  Procedures Procedures    Medications Ordered in ED Medications  ondansetron  (ZOFRAN ) injection 4 mg (has no administration in time range)  sodium chloride  0.9 % bolus 1,000 mL (has no administration in time range)  iohexol  (OMNIPAQUE ) 300 MG/ML solution 100 mL (100 mLs Intravenous Contrast Given 02/22/24 2025)    ED Course/ Medical Decision Making/ A&P                                 Medical Decision Making Amount and/or Complexity of Data Reviewed Labs: ordered. Radiology: ordered.  Risk OTC drugs. Prescription drug management.   Kirsten Long 38 y.o. presented today for abd pain. Working DDx includes, but not limited to, gastroenteritis, colitis, SBO, appendicitis, cholecystitis, hepatobiliary pathology, gastritis, PUD, ACS, dissection,  pancreatitis, nephrolithiasis, AAA, UTI, pyelonephritis, torsion.   R/o DDx: These are considered less likely than current impression due to history of present illness, physical exam, labs/imaging findings.   Pmhx: alkalizing spondylitis, hypertension, cholecystectomy in, hernia repair  Unique Tests and My Interpretation:  CBC without leukocytosis and no anemia.  CMP is remarkable for elevated glucose.  She has no anion gap.  Normal liver function, normal kidney function.  Lipase is 18 pregnancy test is negative and UA is negative for urinary tract infection  Imaging:  CT Abd/Pelvis with contrast: evaluate for structural/surgical etiology of patients' severe abdominal pain--fatty liver with no acute pathology   Problem List / ED Course / Critical interventions / Medication management  Presenting to emergency room with abdominal pain associated with some nausea vomiting and loose stool.  She is unsure what has caused this and has not had any recent antibiotic use.  She is hemodynamically stable and well-appearing.  She does have some nausea but she is tolerating oral intake.  Her labs are overall reassuring without any leukocytosis and no significant anemia.  She does not have urinary tract faction and no CVA tenderness.  Electrolytes are normal.  She does not have pancreatitis.  Will control symptoms with Zofran  and also give fluids.  Plan to reassess and order imaging to rule out any acute intra-abdominal pathology. Patient solving some mild discomfort will give GI cocktail.  Tolerating oral intake, drinking water.  Lab work and imaging so far reassuring.  Will send home with symptomatic management follow-up with primary care. I ordered medication including Zofran , NS Reevaluation of the patient after these medicines showed that the patient improved Patients vitals assessed. Upon arrival patient is hemodynamically stable.  I have reviewed the patients home medicines and have made adjustments as  needed  Plan:  F/u w/ PCP in 2-3d to ensure resolution of sx.  Patient was given return precautions. Patient stable for discharge at this time.  Patient educated on current sx/dx and verbalized understanding of plan. Return to ER w/ new or worsening sx.          Final Clinical Impression(s) / ED Diagnoses Final diagnoses:  Gastroenteritis  Fatty liver    Rx / DC Orders ED Discharge Orders          Ordered    ondansetron  (ZOFRAN -ODT) 4 MG disintegrating tablet  Every 8 hours PRN        02/22/24 2127    pantoprazole  (PROTONIX ) 40 MG tablet  Daily        02/22/24 2127              Rosella Crandell, Kandace Organ, PA-C 02/22/24 2128  Almond Army, MD 02/26/24 [phone]

## 2024-02-22 NOTE — ED Triage Notes (Signed)
 Sharp epigastric pain with nausea and vomiting  Started last night
